# Patient Record
Sex: Female | Born: 1970 | Race: Asian | Hispanic: No | Marital: Married | Smoking: Former smoker
Health system: Southern US, Community
[De-identification: ages and names within clinical notes are randomized; demographics above are authoritative.]

## PROBLEM LIST (undated history)

## (undated) DIAGNOSIS — E039 Hypothyroidism, unspecified: Secondary | ICD-10-CM

## (undated) DIAGNOSIS — Q211 Atrial septal defect: Secondary | ICD-10-CM

## (undated) DIAGNOSIS — E041 Nontoxic single thyroid nodule: Secondary | ICD-10-CM

## (undated) DIAGNOSIS — D649 Anemia, unspecified: Secondary | ICD-10-CM

## (undated) DIAGNOSIS — R921 Mammographic calcification found on diagnostic imaging of breast: Principal | ICD-10-CM

## (undated) DIAGNOSIS — K219 Gastro-esophageal reflux disease without esophagitis: Secondary | ICD-10-CM

## (undated) DIAGNOSIS — Q2112 Patent foramen ovale: Secondary | ICD-10-CM

## (undated) DIAGNOSIS — F418 Other specified anxiety disorders: Secondary | ICD-10-CM

## (undated) DIAGNOSIS — I639 Cerebral infarction, unspecified: Secondary | ICD-10-CM

## (undated) DIAGNOSIS — N920 Excessive and frequent menstruation with regular cycle: Secondary | ICD-10-CM

## (undated) HISTORY — DX: Anemia, unspecified: D64.9

## (undated) HISTORY — DX: Nontoxic single thyroid nodule: E04.1

## (undated) HISTORY — DX: Hypothyroidism, unspecified: E03.9

## (undated) HISTORY — DX: Gastro-esophageal reflux disease without esophagitis: K21.9

## (undated) HISTORY — DX: Mammographic calcification found on diagnostic imaging of breast: R92.1

## (undated) HISTORY — DX: Excessive and frequent menstruation with regular cycle: N92.0

## (undated) HISTORY — DX: Other specified anxiety disorders: F41.8

---

## 2004-07-29 DIAGNOSIS — R921 Mammographic calcification found on diagnostic imaging of breast: Secondary | ICD-10-CM

## 2004-07-29 HISTORY — DX: Mammographic calcification found on diagnostic imaging of breast: R92.1

## 2007-10-28 HISTORY — PX: BIOPSY THYROID: PRO38

## 2008-10-12 LAB — HM MAMMOGRAPHY

## 2011-01-15 ENCOUNTER — Ambulatory Visit (INDEPENDENT_AMBULATORY_CARE_PROVIDER_SITE_OTHER): Payer: 59 | Admitting: Family

## 2011-01-15 ENCOUNTER — Encounter: Payer: Self-pay | Admitting: Family

## 2011-01-15 VITALS — BP 90/70 | HR 66 | Temp 97.8°F | Resp 16 | Ht 63.5 in | Wt 128.0 lb

## 2011-01-15 DIAGNOSIS — E039 Hypothyroidism, unspecified: Secondary | ICD-10-CM

## 2011-01-15 DIAGNOSIS — D239 Other benign neoplasm of skin, unspecified: Secondary | ICD-10-CM

## 2011-01-15 DIAGNOSIS — R921 Mammographic calcification found on diagnostic imaging of breast: Secondary | ICD-10-CM | POA: Insufficient documentation

## 2011-01-15 DIAGNOSIS — Z0000 Encounter for general adult medical examination without abnormal findings: Secondary | ICD-10-CM | POA: Insufficient documentation

## 2011-01-15 DIAGNOSIS — E041 Nontoxic single thyroid nodule: Secondary | ICD-10-CM

## 2011-01-15 DIAGNOSIS — R928 Other abnormal and inconclusive findings on diagnostic imaging of breast: Secondary | ICD-10-CM

## 2011-01-15 DIAGNOSIS — D229 Melanocytic nevi, unspecified: Secondary | ICD-10-CM

## 2011-01-15 HISTORY — DX: Nontoxic single thyroid nodule: E04.1

## 2011-01-15 HISTORY — DX: Hypothyroidism, unspecified: E03.9

## 2011-01-15 LAB — HEPATIC FUNCTION PANEL
AST: 12 U/L (ref 0–37)
Albumin: 4.4 g/dL (ref 3.5–5.2)
Total Bilirubin: 0.5 mg/dL (ref 0.3–1.2)
Total Protein: 7.3 g/dL (ref 6.0–8.3)

## 2011-01-15 LAB — CBC WITH DIFFERENTIAL/PLATELET
Basophils Absolute: 0 10*3/uL (ref 0.0–0.1)
Eosinophils Absolute: 0.1 10*3/uL (ref 0.0–0.7)
Eosinophils Relative: 2 % (ref 0–5)
MCH: 30 pg (ref 26.0–34.0)
MCV: 92 fL (ref 78.0–100.0)
Monocytes Absolute: 0.4 10*3/uL (ref 0.1–1.0)
Platelets: 208 10*3/uL (ref 150–400)
RDW: 12.8 % (ref 11.5–15.5)

## 2011-01-15 LAB — LIPID PANEL
Cholesterol: 192 mg/dL (ref 0–200)
HDL: 63 mg/dL (ref >39)
Total CHOL/HDL Ratio: 3 Ratio
Triglycerides: 74 mg/dL (ref <150)

## 2011-01-15 LAB — TSH: TSH: 0.519 u[IU]/mL (ref 0.350–4.500)

## 2011-01-15 NOTE — Assessment & Plan Note (Signed)
Will try to obtain records from Dr. Katrinka Blazing in regards to previous workup. Check TSH today

## 2011-01-15 NOTE — Patient Instructions (Signed)
Please schedule separate follow up visits for Pap smear and mole removal.

## 2011-01-15 NOTE — Assessment & Plan Note (Signed)
She is due for a followup diagnostic mammogram. We'll try to have this completed at [facility].

## 2011-01-15 NOTE — Progress Notes (Signed)
  Subjective:    Patient ID: Melinda Kirk, female    DOB: 01-Jan-1971, 40 y.o.   MRN: [id number]  HPI  Ms. Melinda Kirk is a 40 yr old female who presents today to establish care.    Thyroid nodule-she reports that she has followed with Dr. Katrinka Blazing of endocrinology in [facility]. She reports history of multiple thyroid nodules as well as a thyroid biopsy which was reportedly normal.  L. Breast calcification- 2006.  This has been watched by serial mammograms.  She has had her mammogram at [facility] in [place].  [facility] She would [facility].  Preventative- Does not exercise regularly.  Last pap was 2/11. Has menses today.  Diet is fairly heathly, lots of veggies.      Review of Systems  Constitutional: Negative for fever and unexpected weight change.  HENT: Negative for congestion.   Eyes:       Notes dry eyes- sees opthalmology  Respiratory: Negative for chest tightness and shortness of breath.   Cardiovascular: Negative for chest pain.  Gastrointestinal: Negative for nausea, vomiting and diarrhea.       Genella Rife- well controlled on omeprazole.  Genitourinary: Negative for menstrual problem.  Musculoskeletal: Positive for back pain. Negative for myalgias and arthralgias.  Neurological: Negative for dizziness, weakness and numbness.  Hematological: Negative for adenopathy. Does not bruise/bleed easily.  Psychiatric/Behavioral:       Denies hx of depression or anxiety.       Objective:   Physical Exam Gen.: Awake, alert, Filipino female in no acute distress Neck: Supple without JVD, no palpable thyroid nodules noted today on exam ENT: Oropharynx is clear without lesions, bilateral tympanic membranes intact without erythema or bulging Cardiovascular: S1-S2, regular rate and rhythm no lower extremity edema is noted Respiratory: Breath sounds are clear to auscultation bilaterally without wheezes rales or rhonchi Breast:  No couple masses noted in either breast, no nipple discharge noted, no dimpling or puckering, no axillary lymphadenopathy is noted Musculoskeletal: No joint swelling is noted Psychiatric patient is awake and oriented x4 calm and pleasant.       Assessment & Plan:

## 2011-01-15 NOTE — Assessment & Plan Note (Signed)
The patient was counseled on diet and exercise. She will return for Pap smear when she has completed her menses. Immunizations reviewed and up-to-date.

## 2011-01-15 NOTE — Assessment & Plan Note (Signed)
She has an enlarging dark mole noted on her right side, we'll plan to bring her back to her shave biopsy.

## 2011-01-15 NOTE — Progress Notes (Signed)
  Subjective:    Patient ID: Melinda Kirk, female    DOB: August 08, 1970, 40 y.o.   MRN: [id number]  HPI    Review of Systems     Past Medical History  Diagnosis Date  . GERD (gastroesophageal reflux disease)   . Breast calcification, left 07/29/04  . Thyroid nodule   . Thyroid nodule     History   Social History  . Marital Status: Married    Spouse Name: N/A    Number of Children: N/A  . Years of Education: N/A   Occupational History  . Not on file.   Social History Main Topics  . Smoking status: Former Smoker -- 5 years  . Smokeless tobacco: Never Used  . Alcohol Use: Yes     occasional  . Drug Use: No  . Sexually Active: Not on file   Other Topics Concern  . 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     Past Surgical History  Procedure Date  . Biopsy thyroid 07/29/08    benign biopsy of thyroid nodules.    Family History  Problem Relation Age of Onset  . Stroke Mother   . Arthritis Mother   . Heart disease Father     No Known Allergies  No current outpatient prescriptions on file prior to visit.    BP 90/70  Pulse 66  Temp(Src) 97.8 F (36.6 C) (Oral)  Resp 16  Ht 5' 3.5" (1.613 m)  Wt 128 lb (58.06 kg)  BMI 22.32 kg/m2  LMP 01/15/2011    Objective:   Physical Exam        Assessment & Plan:

## 2011-01-16 ENCOUNTER — Encounter: Payer: Self-pay | Admitting: Family

## 2011-01-16 LAB — BASIC METABOLIC PANEL WITH GFR
BUN: 10 mg/dL (ref 6–23)
CO2: 25 mEq/L (ref 19–32)
Calcium: 9.5 mg/dL (ref 8.4–10.5)
Creat: 0.62 mg/dL (ref 0.50–1.10)

## 2011-01-18 ENCOUNTER — Ambulatory Visit: Payer: 59 | Admitting: Family

## 2011-01-22 ENCOUNTER — Ambulatory Visit (INDEPENDENT_AMBULATORY_CARE_PROVIDER_SITE_OTHER): Payer: 59 | Admitting: Family

## 2011-01-22 ENCOUNTER — Encounter: Payer: Self-pay | Admitting: Family

## 2011-01-22 ENCOUNTER — Other Ambulatory Visit (HOSPITAL_COMMUNITY)
Admission: RE | Admit: 2011-01-22 | Discharge: 2011-01-22 | Disposition: A | Discharge status: Home / Self Care | Payer: 59 | Source: Ambulatory Visit | Attending: Family | Admitting: Family

## 2011-01-22 DIAGNOSIS — Z01419 Encounter for gynecological examination (general) (routine) without abnormal findings: Secondary | ICD-10-CM | POA: Insufficient documentation

## 2011-01-22 DIAGNOSIS — D225 Melanocytic nevi of trunk: Secondary | ICD-10-CM

## 2011-01-22 DIAGNOSIS — Z0000 Encounter for general adult medical examination without abnormal findings: Secondary | ICD-10-CM | POA: Insufficient documentation

## 2011-01-22 DIAGNOSIS — D485 Neoplasm of uncertain behavior of skin: Secondary | ICD-10-CM

## 2011-01-22 DIAGNOSIS — D235 Other benign neoplasm of skin of trunk: Secondary | ICD-10-CM

## 2011-01-22 DIAGNOSIS — Q560 Hermaphroditism, not elsewhere classified: Secondary | ICD-10-CM

## 2011-01-22 NOTE — Patient Instructions (Signed)
You will be contacted about the pathology results of your Pap and your Mole removal. Follow up in 6 months.

## 2011-01-22 NOTE — Progress Notes (Signed)
  Subjective:    Patient ID: Melinda Kirk, female    DOB: Jan 05, 1971, 40 y.o.   MRN: [id number]  HPI  GYN- last pap was >1 yr ago- always normal.  Period was noted to be >7 days this week.  Otherwise regular periods.  Denies painful cramping. [id number].  Mole- has had "forever" notes that recently it has enlarged.      Review of Systems     Objective:   Physical Exam  Constitutional: She appears well-developed and well-nourished.  Genitourinary: Vagina normal and uterus normal.       IUD string visualized in cervical os.  Difficulty pap due to anterior cervix. No adnexal fullness. No lesions.    Breast: normal, no palpable masses, no axillary LAD. Skin- Black colored irregularly shaped nevus raised beneath right axilla.  Approximately 0.5cm in diameter        Assessment & Plan:

## 2011-01-22 NOTE — Assessment & Plan Note (Signed)
Pap was performed today.

## 2011-01-22 NOTE — Assessment & Plan Note (Signed)
After informed consent, area was prepped with betadine and anesthetized with 1%lidocaine with epi.  Shave off with surgical razor using sterile technique. Area was then cauterized until hemostasis was achieved.

## 2011-01-25 ENCOUNTER — Encounter: Payer: Self-pay | Admitting: Family

## 2011-02-08 ENCOUNTER — Ambulatory Visit
Admission: RE | Admit: 2011-02-08 | Discharge: 2011-02-08 | Disposition: A | Discharge status: Home / Self Care | Payer: 59 | Source: Ambulatory Visit | Attending: Family | Admitting: Family

## 2011-02-08 DIAGNOSIS — R921 Mammographic calcification found on diagnostic imaging of breast: Secondary | ICD-10-CM

## 2011-02-27 ENCOUNTER — Telehealth: Payer: Self-pay | Admitting: *Deleted

## 2011-02-27 ENCOUNTER — Encounter: Payer: Self-pay | Admitting: Family

## 2011-02-27 NOTE — Telephone Encounter (Signed)
Records received from Dr Jonny Ruiz and forwarded to Provider for review.

## 2011-02-27 NOTE — Telephone Encounter (Signed)
Records received from Dr Katrinka Blazing at [facility] at [facility] and forwarded to provider for review.

## 2011-03-08 ENCOUNTER — Encounter: Payer: Self-pay | Admitting: Family

## 2011-03-08 DIAGNOSIS — E559 Vitamin D deficiency, unspecified: Secondary | ICD-10-CM | POA: Insufficient documentation

## 2011-03-19 ENCOUNTER — Encounter: Payer: Self-pay | Admitting: Family

## 2011-05-02 ENCOUNTER — Telehealth: Payer: Self-pay | Admitting: *Deleted

## 2011-05-02 MED ORDER — ESOMEPRAZOLE MAGNESIUM 40 MG PO CPDR: 40.0000 mg | DELAYED_RELEASE_CAPSULE | Freq: Every day | ORAL | Status: DC

## 2011-05-02 NOTE — Telephone Encounter (Signed)
Rx changed to nexium and sent to [facility].

## 2011-05-02 NOTE — Telephone Encounter (Signed)
Pt.notified

## 2011-05-02 NOTE — Telephone Encounter (Signed)
Received call from pt stating Prilosec does not last as long as Nexium does. Pt reports Prilosec seems to run out after 8 hours and Nexium helps her at least 12 hours. Had rx of Nexium from a previous doctor and wants to know if we can prescribe it for her?  Nexium 40mg  1 daily. Please advise.

## 2011-05-03 ENCOUNTER — Telehealth: Payer: Self-pay | Admitting: Family

## 2011-05-03 MED ORDER — ESOMEPRAZOLE MAGNESIUM 40 MG PO CPDR: 40.0000 mg | DELAYED_RELEASE_CAPSULE | Freq: Every day | ORAL | Status: DC

## 2011-05-03 NOTE — Telephone Encounter (Signed)
Nexium rx cancelled at [facility].

## 2011-05-03 NOTE — Telephone Encounter (Signed)
Patient states that she would like to use [facility] [place] outpatient pharmacy for nexium. Please send rx to pharmacy.

## 2011-05-03 NOTE — Telephone Encounter (Signed)
Received form from pharmacy that Nexium is going to require prior authorization. Spoke with pt, she states she has tried Zantac, Prevacid and Prilosec without relief of symptoms. States she is currently taking Prilosec OTC 20mg  once a day and wants to know if she can take it twice a day for symptom control. Please advise.

## 2011-05-03 NOTE — Telephone Encounter (Signed)
Spoke to [place] at [facility] [phone]. She states rx is covered at once daily dosing (30 for 30days). Pharmacy needs to run through as 30 for 30 and it will be covered. Rx sent to [facility] pharmacy. Pt notified.

## 2011-07-02 ENCOUNTER — Ambulatory Visit (INDEPENDENT_AMBULATORY_CARE_PROVIDER_SITE_OTHER): Payer: 59 | Admitting: Family

## 2011-07-02 DIAGNOSIS — K219 Gastro-esophageal reflux disease without esophagitis: Secondary | ICD-10-CM

## 2011-07-02 DIAGNOSIS — Z0289 Encounter for other administrative examinations: Secondary | ICD-10-CM

## 2011-07-02 NOTE — Progress Notes (Signed)
  Subjective:    Patient ID: Melinda Kirk, female    DOB: 10-01-1970, 40 y.o.   MRN: [id number]  HPI    Review of Systems     Objective:   Physical Exam        Assessment & Plan:  Pt no showed her visit today.

## 2011-09-23 ENCOUNTER — Encounter: Payer: Self-pay | Admitting: Family

## 2011-09-23 ENCOUNTER — Ambulatory Visit (INDEPENDENT_AMBULATORY_CARE_PROVIDER_SITE_OTHER): Payer: 59 | Admitting: Family

## 2011-09-23 DIAGNOSIS — H1132 Conjunctival hemorrhage, left eye: Secondary | ICD-10-CM

## 2011-09-23 DIAGNOSIS — H1130 Conjunctival hemorrhage, unspecified eye: Secondary | ICD-10-CM

## 2011-09-23 NOTE — Progress Notes (Signed)
  Subjective:    Patient ID: Melinda Kirk, female    DOB: 08-15-1970, 41 y.o.   MRN: [id number]  HPI  Ms.  Kirk is a 41 yr old female who presents today with chief complaint of redness left eye.  She reports that symptoms started Friday at lunch time.  Denies associated pain, visual problems or discharge.  Review of Systems See HPI     Objective:   Physical Exam  Constitutional: She appears well-developed and well-nourished.  Eyes: Left conjunctiva has a hemorrhage.    Cardiovascular: Normal rate and regular rhythm.   No murmur heard. Pulmonary/Chest: Effort normal and breath sounds normal.  Psychiatric: She has a normal mood and affect. Her behavior is normal. Judgment and thought content normal.          Assessment & Plan:

## 2011-09-23 NOTE — Patient Instructions (Signed)
Please call if symptoms worsen or if no improvement in 1 week.  

## 2011-09-23 NOTE — Assessment & Plan Note (Signed)
41 yr old female with conjunctival hemorrhage.  She is instructed to go to the ED if she develops visual problems.  Provided reassurance that this is generally self limited.  I recommended saline eye drops otc as needed.

## 2012-09-12 ENCOUNTER — Other Ambulatory Visit: Payer: Self-pay

## 2012-12-31 ENCOUNTER — Encounter: Payer: Self-pay | Admitting: Family Medicine

## 2012-12-31 ENCOUNTER — Ambulatory Visit (INDEPENDENT_AMBULATORY_CARE_PROVIDER_SITE_OTHER): Payer: 59 | Admitting: Family Medicine

## 2012-12-31 VITALS — BP 104/60 | HR 71 | Temp 98.2°F | Ht 63.5 in | Wt 120.1 lb

## 2012-12-31 DIAGNOSIS — R51 Headache: Secondary | ICD-10-CM

## 2012-12-31 DIAGNOSIS — M542 Cervicalgia: Secondary | ICD-10-CM

## 2012-12-31 MED ORDER — CYCLOBENZAPRINE HCL 10 MG PO TABS: 10.0000 mg | ORAL_TABLET | Freq: Every evening | ORAL | Status: DC | PRN

## 2012-12-31 NOTE — Patient Instructions (Addendum)
Need 64 oz of clear fluids daily Ginger can help nausea,  protein with each meal Start a calcium/magnesium/zinc daily tums for heartburn Moist heat and stretch neck  Do not skip breakfast Labs in next couple weeks magnesium lipid, renal, tsh, hepatic, cbc Next visit annual   Gastroesophageal Reflux Disease, Adult Gastroesophageal reflux disease (GERD) happens when acid from your stomach flows up into the esophagus. When acid comes in contact with the esophagus, the acid causes soreness (inflammation) in the esophagus. Over time, GERD may create small holes (ulcers) in the lining of the esophagus. CAUSES   Increased body weight. This puts pressure on the stomach, making acid rise from the stomach into the esophagus.  Smoking. This increases acid production in the stomach.  Drinking alcohol. This causes decreased pressure in the lower esophageal sphincter (valve or ring of muscle between the esophagus and stomach), allowing acid from the stomach into the esophagus.  Late evening meals and a full stomach. This increases pressure and acid production in the stomach.  A malformed lower esophageal sphincter. Sometimes, no cause is found. SYMPTOMS   Burning pain in the lower part of the mid-chest behind the breastbone and in the mid-stomach area. This may occur twice a week or more often.  Trouble swallowing.  Sore throat.  Dry cough.  Asthma-like symptoms including chest tightness, shortness of breath, or wheezing. DIAGNOSIS  Your caregiver may be able to diagnose GERD based on your symptoms. In some cases, X-rays and other tests may be done to check for complications or to check the condition of your stomach and esophagus. TREATMENT  Your caregiver may recommend over-the-counter or prescription medicines to help decrease acid production. Ask your caregiver before starting or adding any new medicines.  HOME CARE INSTRUCTIONS   Change the factors that you can control. Ask your  caregiver for guidance concerning weight loss, quitting smoking, and alcohol consumption.  Avoid foods and drinks that make your symptoms worse, such as:  Caffeine or alcoholic drinks.  Chocolate.  Peppermint or mint flavorings.  Garlic and onions.  Spicy foods.  Citrus fruits, such as oranges, lemons, or limes.  Tomato-based foods such as sauce, chili, salsa, and pizza.  Fried and fatty foods.  Avoid lying down for the 3 hours prior to your bedtime or prior to taking a nap.  Eat small, frequent meals instead of large meals.  Wear loose-fitting clothing. Do not wear anything tight around your waist that causes pressure on your stomach.  Raise the head of your bed 6 to 8 inches with wood blocks to help you sleep. Extra pillows will not help.  Only take over-the-counter or prescription medicines for pain, discomfort, or fever as directed by your caregiver.  Do not take aspirin, ibuprofen, or other nonsteroidal anti-inflammatory drugs (NSAIDs). SEEK IMMEDIATE MEDICAL CARE IF:   You have pain in your arms, neck, jaw, teeth, or back.  Your pain increases or changes in intensity or duration.  You develop nausea, vomiting, or sweating (diaphoresis).  You develop shortness of breath, or you faint.  Your vomit is green, yellow, black, or looks like coffee grounds or blood.  Your stool is red, bloody, or black. These symptoms could be signs of other problems, such as heart disease, gastric bleeding, or esophageal bleeding. MAKE SURE YOU:   Understand these instructions.  Will watch your condition.  Will get help right away if you are not doing well or get worse. Document Released: 04/24/2005 Document Revised: 10/07/2011 Document Reviewed: 02/01/2011 Exit[employer] Patient  Information 2014 [employer].  

## 2013-01-03 ENCOUNTER — Encounter: Payer: Self-pay | Admitting: Family Medicine

## 2013-01-03 DIAGNOSIS — G43909 Migraine, unspecified, not intractable, without status migrainosus: Secondary | ICD-10-CM | POA: Insufficient documentation

## 2013-01-03 NOTE — Assessment & Plan Note (Addendum)
Patient with neck pain and spasm. Start Flexeril qhs, Naproxen bid prn and moist heat with stretching.

## 2013-01-03 NOTE — Progress Notes (Signed)
Patient ID: Melinda Kirk, female   DOB: [birth year], 42 y.o.   MRN: [id number] RUDIE RIKARD [id number] 05-Nov-1970 01/03/2013      Progress Note-Follow Up  Subjective  Chief Complaint  Chief Complaint  Patient presents with  . Headache    X off and on for a week    HPI  Patient is a 42 year old female in today to discuss headaches. Shows a long history of headaches and photophobia and phonophobia this headache has been persistent./Extraocular left occiput began this week and she describes a pulling and pulsing sensation. No injury or fall. No fevers or chills. Some mild congestion but no cough sore throat ear pain. She's tried some Aleve with only minimal results Tylenol: Minimal results. Pain has been better and worse throughout the week. No chest pain, acute shortness of breath  Past Medical History  Diagnosis Date  . GERD (gastroesophageal reflux disease)   . Breast calcification, left 07/29/04  . Thyroid nodule     s/p FNA- 4/09- benign  . Vitamin D deficiency 03/08/2011  . Headache(784.0) 01/03/2013    Past Surgical History  Procedure Laterality Date  . Biopsy thyroid  10/2007    benign biopsy of thyroid nodules.    Family History  Problem Relation Age of Onset  . Stroke Mother   . Arthritis Mother   . Heart disease Father     History   Social History  . Marital Status: Married    Spouse Name: N/A    Number of Children: N/A  . Years of Education: N/A   Occupational History  . Not on file.   Social History Main Topics  . Smoking status: Former Smoker -- 5 years  . Smokeless tobacco: Never Used  . Alcohol Use: Yes     Comment: occasional  . Drug Use: No  . Sexually Active: Not on file   Other Topics Concern  . Not on file   Social History Narrative   Regular exercise:  No   Caffeine: 1-2 cups coffee / tea daily.   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     Current Outpatient Prescriptions on File Prior  to Visit  Medication Sig Dispense Refill  . esomeprazole (NEXIUM) 40 MG capsule Take 1 capsule (40 mg total) by mouth daily before breakfast.  30 capsule  5  . levonorgestrel (MIRENA) 20 MCG/24HR IUD 1 each by Intrauterine route once.         No current facility-administered medications on file prior to visit.    No Known Allergies  Review of Systems  Review of Systems  Constitutional: Negative for fever and malaise/fatigue.  HENT: Positive for neck pain. Negative for congestion.   Eyes: Positive for photophobia. Negative for discharge.  Respiratory: Negative for shortness of breath.   Cardiovascular: Negative for chest pain, palpitations and leg swelling.  Gastrointestinal: Negative for nausea, abdominal pain and diarrhea.  Genitourinary: Negative for dysuria.  Musculoskeletal: Negative for falls.  Skin: Negative for rash.  Neurological: Positive for headaches. Negative for loss of consciousness.  Endo/Heme/Allergies: Negative for polydipsia.  Psychiatric/Behavioral: Negative for depression and suicidal ideas. The patient is not nervous/anxious and does not have insomnia.     Objective  BP 104/60  Pulse 71  Temp(Src) 98.2 F (36.8 C) (Oral)  Ht 5' 3.5" (1.613 m)  Wt 120 lb 1.3 oz (54.468 kg)  BMI 20.93 kg/m2  SpO2 98%  LMP 12/31/2012  Physical Exam  Physical Exam  Constitutional: She is oriented to person, place, and time and well-developed, well-nourished, and in no distress. No distress.  HENT:  Head: Normocephalic and atraumatic.  Eyes: Conjunctivae are normal.  Neck: Neck supple. No thyromegaly present.  Cardiovascular: Normal rate, regular rhythm and normal heart sounds.   No murmur heard. Pulmonary/Chest: Effort normal and breath sounds normal. She has no wheezes.  Abdominal: She exhibits no distension and no mass.  Musculoskeletal: She exhibits no edema.  Lymphadenopathy:    She has no cervical adenopathy.  Neurological: She is alert and oriented to  person, place, and time.  Skin: Skin is warm and dry. No rash noted. She is not diaphoretic.  Psychiatric: Memory, affect and judgment normal.    Lab Results  Component Value Date   TSH 0.519 01/15/2011   Lab Results  Component Value Date   WBC 6.4 01/15/2011   HGB 12.7 01/15/2011   HCT 39.0 01/15/2011   MCV 92.0 01/15/2011   PLT 208 01/15/2011   Lab Results  Component Value Date   CREATININE 0.62 01/15/2011   BUN 10 01/15/2011   NA 139 01/15/2011   K 4.4 01/15/2011   CL 104 01/15/2011   CO2 25 01/15/2011   Lab Results  Component Value Date   ALT <8 01/15/2011   AST 12 01/15/2011   ALKPHOS 43 01/15/2011   BILITOT 0.5 01/15/2011   Lab Results  Component Value Date   CHOL 192 01/15/2011   Lab Results  Component Value Date   HDL 63 01/15/2011   Lab Results  Component Value Date   LDLCALC 114* 01/15/2011   Lab Results  Component Value Date   TRIG 74 01/15/2011   Lab Results  Component Value Date   CHOLHDL 3.0 01/15/2011     Assessment & Plan  Headache(784.0) Patient with neck pain and spasm. Start Flexeril qhs, Naproxen bid prn and moist heat with stretching.

## 2013-02-05 ENCOUNTER — Telehealth: Payer: Self-pay | Admitting: *Deleted

## 2013-02-05 DIAGNOSIS — Z0000 Encounter for general adult medical examination without abnormal findings: Secondary | ICD-10-CM

## 2013-02-05 LAB — CBC WITH DIFFERENTIAL/PLATELET
Basophils Absolute: 0.1 10*3/uL (ref 0.0–0.1)
Eosinophils Absolute: 0.1 10*3/uL (ref 0.0–0.7)
Eosinophils Relative: 2 % (ref 0–5)
Lymphocytes Relative: 37 % (ref 12–46)
Lymphs Abs: 2.4 10*3/uL (ref 0.7–4.0)
MCH: 29.3 pg (ref 26.0–34.0)
MCV: 88.9 fL (ref 78.0–100.0)
Neutrophils Relative %: 56 % (ref 43–77)
Platelets: 271 10*3/uL (ref 150–400)
RBC: 4.58 MIL/uL (ref 3.87–5.11)
RDW: 12.9 % (ref 11.5–15.5)
WBC: 6.6 10*3/uL (ref 4.0–10.5)

## 2013-02-05 NOTE — Telephone Encounter (Signed)
Pt presented in lab today for blood draw w/o order; pt has CPE scheduled for 07.14.14, orders entered/SLS

## 2013-02-06 LAB — HEPATIC FUNCTION PANEL
ALT: 14 U/L (ref 0–35)
AST: 16 U/L (ref 0–37)
Bilirubin, Direct: 0.1 mg/dL (ref 0.0–0.3)
Total Protein: 7.4 g/dL (ref 6.0–8.3)

## 2013-02-06 LAB — BASIC METABOLIC PANEL
Calcium: 9.3 mg/dL (ref 8.4–10.5)
Glucose, Bld: 75 mg/dL (ref 70–99)
Potassium: 4.3 mEq/L (ref 3.5–5.3)
Sodium: 139 mEq/L (ref 135–145)

## 2013-02-06 LAB — LIPID PANEL
Cholesterol: 209 mg/dL — ABNORMAL HIGH (ref 0–200)
HDL: 59 mg/dL (ref >39)
Total CHOL/HDL Ratio: 3.5 Ratio
Triglycerides: 98 mg/dL (ref <150)

## 2013-02-06 LAB — URINALYSIS, MICROSCOPIC ONLY: Squamous Epithelial / LPF: NONE SEEN

## 2013-02-08 ENCOUNTER — Ambulatory Visit (INDEPENDENT_AMBULATORY_CARE_PROVIDER_SITE_OTHER): Payer: 59 | Admitting: Family Medicine

## 2013-02-08 ENCOUNTER — Encounter: Payer: Self-pay | Admitting: Family Medicine

## 2013-02-08 VITALS — BP 96/60 | HR 75 | Temp 98.0°F | Ht 63.5 in | Wt 122.1 lb

## 2013-02-08 DIAGNOSIS — E785 Hyperlipidemia, unspecified: Secondary | ICD-10-CM

## 2013-02-08 DIAGNOSIS — K219 Gastro-esophageal reflux disease without esophagitis: Secondary | ICD-10-CM

## 2013-02-08 DIAGNOSIS — E039 Hypothyroidism, unspecified: Secondary | ICD-10-CM

## 2013-02-08 DIAGNOSIS — E782 Mixed hyperlipidemia: Secondary | ICD-10-CM | POA: Insufficient documentation

## 2013-02-08 DIAGNOSIS — Z0000 Encounter for general adult medical examination without abnormal findings: Secondary | ICD-10-CM

## 2013-02-08 NOTE — Patient Instructions (Addendum)
Next visit annual with labs prior vitamin D, liver, renal, cbc, tsh, lipid  Topical Salon Pas patches or cream  MegaRed krill oil caps daily  Digestive Advantage probiotic daily by [employer]  Preventive Care for Adults, Female A healthy lifestyle and preventive care can promote health and wellness. Preventive health guidelines for women include the following key practices.  A routine yearly physical is a good way to check with your caregiver about your health and preventive screening. It is a chance to share any concerns and updates on your health, and to receive a thorough exam.  Visit your dentist for a routine exam and preventive care every 6 months. Brush your teeth twice a day and floss once a day. Good oral hygiene prevents tooth decay and gum disease.  The frequency of eye exams is based on your age, health, family medical history, use of contact lenses, and other factors. Follow your caregiver's recommendations for frequency of eye exams.  Eat a healthy diet. Foods like vegetables, fruits, whole grains, low-fat dairy products, and lean protein foods contain the nutrients you need without too many calories. Decrease your intake of foods high in solid fats, added sugars, and salt. Eat the right amount of calories for you.Get information about a proper diet from your caregiver, if necessary.  Regular physical exercise is one of the most important things you can do for your health. Most adults should get at least 150 minutes of moderate-intensity exercise (any activity that increases your heart rate and causes you to sweat) each week. In addition, most adults need muscle-strengthening exercises on 2 or more days a week.  Maintain a healthy weight. The body mass index (BMI) is a screening tool to identify possible weight problems. It provides an estimate of body fat based on height and weight. Your caregiver can help determine your BMI, and can help you achieve or maintain a healthy weight.For  adults 20 years and older:  A BMI below 18.5 is considered underweight.  A BMI of 18.5 to 24.9 is normal.  A BMI of 25 to 29.9 is considered overweight.  A BMI of 30 and above is considered obese.  Maintain normal blood lipids and cholesterol levels by exercising and minimizing your intake of saturated fat. Eat a balanced diet with plenty of fruit and vegetables. Blood tests for lipids and cholesterol should begin at age 21 and be repeated every 5 years. If your lipid or cholesterol levels are high, you are over 50, or you are at high risk for heart disease, you may need your cholesterol levels checked more frequently.Ongoing high lipid and cholesterol levels should be treated with medicines if diet and exercise are not effective.  If you smoke, find out from your caregiver how to quit. If you do not use tobacco, do not start.  If you are pregnant, do not drink alcohol. If you are breastfeeding, be very cautious about drinking alcohol. If you are not pregnant and choose to drink alcohol, do not exceed 1 drink per day. One drink is considered to be 12 ounces (355 mL) of beer, 5 ounces (148 mL) of wine, or 1.5 ounces (44 mL) of liquor.  Avoid use of street drugs. Do not share needles with anyone. Ask for help if you need support or instructions about stopping the use of drugs.  High blood pressure causes heart disease and increases the risk of stroke. Your blood pressure should be checked at least every 1 to 2 years. Ongoing high blood pressure should  be treated with medicines if weight loss and exercise are not effective.  If you are 54 to 43 years old, ask your caregiver if you should take aspirin to prevent strokes.  Diabetes screening involves taking a blood sample to check your fasting blood sugar level. This should be done once every 3 years, after age 35, if you are within normal weight and without risk factors for diabetes. Testing should be considered at a younger age or be carried out  more frequently if you are overweight and have at least 1 risk factor for diabetes.  Breast cancer screening is essential preventive care for women. You should practice "breast self-awareness." This means understanding the normal appearance and feel of your breasts and may include breast self-examination. Any changes detected, no matter how small, should be reported to a caregiver. Women in their 30s and 30s should have a clinical breast exam (CBE) by a caregiver as part of a regular health exam every 1 to 3 years. After age 57, women should have a CBE every year. Starting at age 10, women should consider having a mammography (breast X-ray test) every year. Women who have a family history of breast cancer should talk to their caregiver about genetic screening. Women at a high risk of breast cancer should talk to their caregivers about having magnetic resonance imaging (MRI) and a mammography every year.  The Pap test is a screening test for cervical cancer. A Pap test can show cell changes on the cervix that might become cervical cancer if left untreated. A Pap test is a procedure in which cells are obtained and examined from the lower end of the uterus (cervix).  Women should have a Pap test starting at age 78.  Between ages 43 and 57, Pap tests should be repeated every 2 years.  Beginning at age 49, you should have a Pap test every 3 years as long as the past 3 Pap tests have been normal.  Some women have medical problems that increase the chance of getting cervical cancer. Talk to your caregiver about these problems. It is especially important to talk to your caregiver if a new problem develops soon after your last Pap test. In these cases, your caregiver may recommend more frequent screening and Pap tests.  The above recommendations are the same for women who have or have not gotten the vaccine for human papillomavirus (HPV).  If you had a hysterectomy for a problem that was not cancer or a  condition that could lead to cancer, then you no longer need Pap tests. Even if you no longer need a Pap test, a regular exam is a good idea to make sure no other problems are starting.  If you are between ages 50 and 66, and you have had normal Pap tests going back 10 years, you no longer need Pap tests. Even if you no longer need a Pap test, a regular exam is a good idea to make sure no other problems are starting.  If you have had past treatment for cervical cancer or a condition that could lead to cancer, you need Pap tests and screening for cancer for at least 20 years after your treatment.  If Pap tests have been discontinued, risk factors (such as a new sexual partner) need to be reassessed to determine if screening should be resumed.  The HPV test is an additional test that may be used for cervical cancer screening. The HPV test looks for the virus that can cause  the cell changes on the cervix. The cells collected during the Pap test can be tested for HPV. The HPV test could be used to screen women aged 28 years and older, and should be used in women of any age who have unclear Pap test results. After the age of 34, women should have HPV testing at the same frequency as a Pap test.  Colorectal cancer can be detected and often prevented. Most routine colorectal cancer screening begins at the age of 11 and continues through age 41. However, your caregiver may recommend screening at an earlier age if you have risk factors for colon cancer. On a yearly basis, your caregiver may provide home test kits to check for hidden blood in the stool. Use of a small camera at the end of a tube, to directly examine the colon (sigmoidoscopy or colonoscopy), can detect the earliest forms of colorectal cancer. Talk to your caregiver about this at age 70, when routine screening begins. Direct examination of the colon should be repeated every 5 to 10 years through age 16, unless early forms of pre-cancerous polyps or  small growths are found.  Hepatitis C blood testing is recommended for all people born from [birth year] through 1965 and any individual with known risks for hepatitis C.  Practice safe sex. Use condoms and avoid high-risk sexual practices to reduce the spread of sexually transmitted infections (STIs). STIs include gonorrhea, chlamydia, syphilis, trichomonas, herpes, HPV, and human immunodeficiency virus (HIV). Herpes, HIV, and HPV are viral illnesses that have no cure. They can result in disability, cancer, and death. Sexually active women aged 56 and younger should be checked for chlamydia. Older women with new or multiple partners should also be tested for chlamydia. Testing for other STIs is recommended if you are sexually active and at increased risk.  Osteoporosis is a disease in which the bones lose minerals and strength with aging. This can result in serious bone fractures. The risk of osteoporosis can be identified using a bone density scan. Women ages 72 and over and women at risk for fractures or osteoporosis should discuss screening with their caregivers. Ask your caregiver whether you should take a calcium supplement or vitamin D to reduce the rate of osteoporosis.  Menopause can be associated with physical symptoms and risks. Hormone replacement therapy is available to decrease symptoms and risks. You should talk to your caregiver about whether hormone replacement therapy is right for you.  Use sunscreen with sun protection factor (SPF) of 30 or more. Apply sunscreen liberally and repeatedly throughout the day. You should seek shade when your shadow is shorter than you. Protect yourself by wearing long sleeves, pants, a wide-brimmed hat, and sunglasses year round, whenever you are outdoors.  Once a month, do a whole body skin exam, using a mirror to look at the skin on your back. Notify your caregiver of new moles, moles that have irregular borders, moles that are larger than a pencil eraser, or  moles that have changed in shape or color.  Stay current with required immunizations.  Influenza. You need a dose every fall (or winter). The composition of the flu vaccine changes each year, so being vaccinated once is not enough.  Pneumococcal polysaccharide. You need 1 to 2 doses if you smoke cigarettes or if you have certain chronic medical conditions. You need 1 dose at age 8 (or older) if you have never been vaccinated.  Tetanus, diphtheria, pertussis (Tdap, Td). Get 1 dose of Tdap vaccine if you are  younger than age 21, are over 68 and have contact with an infant, are a Research scientist (physical sciences), are pregnant, or simply want to be protected from whooping cough. After that, you need a Td booster dose every 10 years. Consult your caregiver if you have not had at least 3 tetanus and diphtheria-containing shots sometime in your life or have a deep or dirty wound.  HPV. You need this vaccine if you are a woman age 74 or younger. The vaccine is given in 3 doses over 6 months.  Measles, mumps, rubella (MMR). You need at least 1 dose of MMR if you were born in 1957 or later. You may also need a second dose.  Meningococcal. If you are age 70 to 60 and a first-year college student living in a residence hall, or have one of several medical conditions, you need to get vaccinated against meningococcal disease. You may also need additional booster doses.  Zoster (shingles). If you are age 79 or older, you should get this vaccine.  Varicella (chickenpox). If you have never had chickenpox or you were vaccinated but received only 1 dose, talk to your caregiver to find out if you need this vaccine.  Hepatitis A. You need this vaccine if you have a specific risk factor for hepatitis A virus infection or you simply wish to be protected from this disease. The vaccine is usually given as 2 doses, 6 to 18 months apart.  Hepatitis B. You need this vaccine if you have a specific risk factor for hepatitis B virus  infection or you simply wish to be protected from this disease. The vaccine is given in 3 doses, usually over 6 months. Preventive Services / Frequency Ages 22 to 69  Blood pressure check.** / Every 1 to 2 years.  Lipid and cholesterol check.** / Every 5 years beginning at age 84.  Clinical breast exam.** / Every 3 years for women in their 59s and 30s.  Pap test.** / Every 2 years from ages 39 through 29. Every 3 years starting at age 24 through age 93 or 72 with a history of 3 consecutive normal Pap tests.  HPV screening.** / Every 3 years from ages 15 through ages 35 to 41 with a history of 3 consecutive normal Pap tests.  Hepatitis C blood test.** / For any individual with known risks for hepatitis C.  Skin self-exam. / Monthly.  Influenza immunization.** / Every year.  Pneumococcal polysaccharide immunization.** / 1 to 2 doses if you smoke cigarettes or if you have certain chronic medical conditions.  Tetanus, diphtheria, pertussis (Tdap, Td) immunization. / A one-time dose of Tdap vaccine. After that, you need a Td booster dose every 10 years.  HPV immunization. / 3 doses over 6 months, if you are 109 and younger.  Measles, mumps, rubella (MMR) immunization. / You need at least 1 dose of MMR if you were born in 1957 or later. You may also need a second dose.  Meningococcal immunization. / 1 dose if you are age 46 to 45 and a first-year college student living in a residence hall, or have one of several medical conditions, you need to get vaccinated against meningococcal disease. You may also need additional booster doses.  Varicella immunization.** / Consult your caregiver.  Hepatitis A immunization.** / Consult your caregiver. 2 doses, 6 to 18 months apart.  Hepatitis B immunization.** / Consult your caregiver. 3 doses usually over 6 months. Ages 47 to 74  Blood pressure check.** / Every 1 to  2 years.  Lipid and cholesterol check.** / Every 5 years beginning at age  38.  Clinical breast exam.** / Every year after age 20.  Mammogram.** / Every year beginning at age 78 and continuing for as long as you are in good health. Consult with your caregiver.  Pap test.** / Every 3 years starting at age 33 through age 68 or 4 with a history of 3 consecutive normal Pap tests.  HPV screening.** / Every 3 years from ages 24 through ages 22 to 68 with a history of 3 consecutive normal Pap tests.  Fecal occult blood test (FOBT) of stool. / Every year beginning at age 71 and continuing until age 74. You may not need to do this test if you get a colonoscopy every 10 years.  Flexible sigmoidoscopy or colonoscopy.** / Every 5 years for a flexible sigmoidoscopy or every 10 years for a colonoscopy beginning at age 48 and continuing until age 47.  Hepatitis C blood test.** / For all people born from [birth year] through 1966 and any individual with known risks for hepatitis C.  Skin self-exam. / Monthly.  Influenza immunization.** / Every year.  Pneumococcal polysaccharide immunization.** / 1 to 2 doses if you smoke cigarettes or if you have certain chronic medical conditions.  Tetanus, diphtheria, pertussis (Tdap, Td) immunization.** / A one-time dose of Tdap vaccine. After that, you need a Td booster dose every 10 years.  Measles, mumps, rubella (MMR) immunization. / You need at least 1 dose of MMR if you were born in 1957 or later. You may also need a second dose.  Varicella immunization.** / Consult your caregiver.  Meningococcal immunization.** / Consult your caregiver.  Hepatitis A immunization.** / Consult your caregiver. 2 doses, 6 to 18 months apart.  Hepatitis B immunization.** / Consult your caregiver. 3 doses, usually over 6 months. Ages 55 and over  Blood pressure check.** / Every 1 to 2 years.  Lipid and cholesterol check.** / Every 5 years beginning at age 81.  Clinical breast exam.** / Every year after age 67.  Mammogram.** / Every year beginning at  age 79 and continuing for as long as you are in good health. Consult with your caregiver.  Pap test.** / Every 3 years starting at age 50 through age 68 or 31 with a 3 consecutive normal Pap tests. Testing can be stopped between 65 and 70 with 3 consecutive normal Pap tests and no abnormal Pap or HPV tests in the past 10 years.  HPV screening.** / Every 3 years from ages 56 through ages 39 or 55 with a history of 3 consecutive normal Pap tests. Testing can be stopped between 65 and 70 with 3 consecutive normal Pap tests and no abnormal Pap or HPV tests in the past 10 years.  Fecal occult blood test (FOBT) of stool. / Every year beginning at age 50 and continuing until age 16. You may not need to do this test if you get a colonoscopy every 10 years.  Flexible sigmoidoscopy or colonoscopy.** / Every 5 years for a flexible sigmoidoscopy or every 10 years for a colonoscopy beginning at age 28 and continuing until age 9.  Hepatitis C blood test.** / For all people born from [birth year] through 1966 and any individual with known risks for hepatitis C.  Osteoporosis screening.** / A one-time screening for women ages 72 and over and women at risk for fractures or osteoporosis.  Skin self-exam. / Monthly.  Influenza immunization.** / Every year.  Pneumococcal  polysaccharide immunization.** / 1 dose at age 40 (or older) if you have never been vaccinated.  Tetanus, diphtheria, pertussis (Tdap, Td) immunization. / A one-time dose of Tdap vaccine if you are over 65 and have contact with an infant, are a Research scientist (physical sciences), or simply want to be protected from whooping cough. After that, you need a Td booster dose every 10 years.  Varicella immunization.** / Consult your caregiver.  Meningococcal immunization.** / Consult your caregiver.  Hepatitis A immunization.** / Consult your caregiver. 2 doses, 6 to 18 months apart.  Hepatitis B immunization.** / Check with your caregiver. 3 doses, usually over 6  months. ** Family history and personal history of risk and conditions may change your caregiver's recommendations. Document Released: 09/10/2001 Document Revised: 10/07/2011 Document Reviewed: 12/10/2010 [facility] Patient Information 2014 [place], [place].

## 2013-02-10 ENCOUNTER — Encounter: Payer: Self-pay | Admitting: Family Medicine

## 2013-02-10 NOTE — Assessment & Plan Note (Signed)
Encouraged heart healthy diet, regular exercise. Pap is up to date. Encouraged MGM every 1-2 years.

## 2013-02-10 NOTE — Assessment & Plan Note (Signed)
Well treated. No changes 

## 2013-02-10 NOTE — Assessment & Plan Note (Signed)
Mild, start MegaRed krill oil caps, aviud trabs fats,

## 2013-02-10 NOTE — Assessment & Plan Note (Signed)
Encouraged probiotics daily, avoid offending foods.

## 2013-02-10 NOTE — Progress Notes (Signed)
Patient ID: Melinda Kirk, female   DOB: 1971-07-16, 42 y.o.   MRN: [id number] MCKENLEE MANGHAM [id number] [birth year] 02/10/2013      Progress Note-Follow Up  Subjective  Chief Complaint  Chief Complaint  Patient presents with  . Annual Exam    physical/ new patient- transfer from [facility]    HPI  Patient is a 42 year old female in today for annual exam. Overall feeling well. Does have some concerns regarding lactose intolerance and intermittent gas and abdominal discomfort. No bloody or tarry stool. No nausea or vomiting. No diarrhea. No urinary complaints. No recent illness. No chest pain, palpitations, shortness of breath, fevers.  Past Medical History  Diagnosis Date  . GERD (gastroesophageal reflux disease)   . Breast calcification, left 07/29/04  . Thyroid nodule     s/p FNA- 4/09- benign  . Vitamin D deficiency 03/08/2011  . Headache(784.0) 01/03/2013  . Other and unspecified hyperlipidemia 02/08/2013  . Unspecified hypothyroidism 01/15/2011  . Preventative health care 01/22/2011    Past Surgical History  Procedure Laterality Date  . Biopsy thyroid  10/2007    benign biopsy of thyroid nodules.    Family History  Problem Relation Age of Onset  . Stroke Mother   . Arthritis Mother   . Heart disease Father     CHF?    History   Social History  . Marital Status: Married    Spouse Name: N/A    Number of Children: N/A  . Years of Education: N/A   Occupational History  . Not on file.   Social History Main Topics  . Smoking status: Former Smoker -- 5 years  . Smokeless tobacco: Never Used  . Alcohol Use: Yes     Comment: occasional  . Drug Use: No  . Sexually Active: Not on file   Other Topics Concern  . Not on file   Social History Narrative   Regular exercise:  No   Caffeine: 1-2 cups coffee / tea daily.   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     Current Outpatient Prescriptions on File Prior to Visit   Medication Sig Dispense Refill  . esomeprazole (NEXIUM) 40 MG capsule Take 1 capsule (40 mg total) by mouth daily before breakfast.  30 capsule  5  . levonorgestrel (MIRENA) 20 MCG/24HR IUD 1 each by Intrauterine route once.        . naproxen sodium (ANAPROX) 220 MG tablet Take 220 mg by mouth 2 (two) times daily with a meal.      . cyclobenzaprine (FLEXERIL) 10 MG tablet Take 1 tablet (10 mg total) by mouth at bedtime as needed for muscle spasms.  30 tablet  1   No current facility-administered medications on file prior to visit.    No Known Allergies  Review of Systems  Review of Systems  Constitutional: Negative for fever and malaise/fatigue.  HENT: Negative for congestion.   Eyes: Negative for pain and discharge.  Respiratory: Negative for shortness of breath.   Cardiovascular: Negative for chest pain, palpitations and leg swelling.  Gastrointestinal: Negative for nausea, abdominal pain and diarrhea.  Genitourinary: Negative for dysuria.  Musculoskeletal: Negative for falls.  Skin: Negative for rash.  Neurological: Negative for loss of consciousness and headaches.  Endo/Heme/Allergies: Negative for polydipsia.  Psychiatric/Behavioral: Negative for depression and suicidal ideas. The patient is not nervous/anxious and does not have insomnia.     Objective  BP  96/60  Pulse 75  Temp(Src) 98 F (36.7 C) (Oral)  Ht 5' 3.5" (1.613 m)  Wt 122 lb 1.3 oz (55.375 kg)  BMI 21.28 kg/m2  SpO2 97%  LMP 12/31/2012  Physical Exam  Physical Exam  Constitutional: She is oriented to person, place, and time and well-developed, well-nourished, and in no distress. No distress.  HENT:  Head: Normocephalic and atraumatic.  Right Ear: External ear normal.  Left Ear: External ear normal.  Nose: Nose normal.  Mouth/Throat: Oropharynx is clear and moist. No oropharyngeal exudate.  Eyes: Conjunctivae are normal. Pupils are equal, round, and reactive to light. Right eye exhibits no  discharge. Left eye exhibits no discharge. No scleral icterus.  Neck: Normal range of motion. Neck supple. No thyromegaly present.  Cardiovascular: Normal rate, regular rhythm and intact distal pulses.  Exam reveals no gallop.   No murmur heard. Pulmonary/Chest: Effort normal and breath sounds normal. No respiratory distress. She has no wheezes. She has no rales.  Abdominal: Soft. Bowel sounds are normal. She exhibits no distension and no mass. There is no tenderness.  Musculoskeletal: Normal range of motion. She exhibits no edema and no tenderness.  Lymphadenopathy:    She has no cervical adenopathy.  Neurological: She is alert and oriented to person, place, and time. She has normal reflexes. No cranial nerve deficit. Coordination normal.  Skin: Skin is warm and dry. No rash noted. She is not diaphoretic.  Psychiatric: Mood, memory and affect normal.    Lab Results  Component Value Date   TSH 1.196 02/05/2013   Lab Results  Component Value Date   WBC 6.6 02/05/2013   HGB 13.4 02/05/2013   HCT 40.7 02/05/2013   MCV 88.9 02/05/2013   PLT 271 02/05/2013   Lab Results  Component Value Date   CREATININE 0.65 02/05/2013   BUN 14 02/05/2013   NA 139 02/05/2013   K 4.3 02/05/2013   CL 106 02/05/2013   CO2 26 02/05/2013   Lab Results  Component Value Date   ALT 14 02/05/2013   AST 16 02/05/2013   ALKPHOS 48 02/05/2013   BILITOT 0.6 02/05/2013   Lab Results  Component Value Date   CHOL 209* 02/05/2013   Lab Results  Component Value Date   HDL 59 02/05/2013   Lab Results  Component Value Date   LDLCALC 130* 02/05/2013   Lab Results  Component Value Date   TRIG 98 02/05/2013   Lab Results  Component Value Date   CHOLHDL 3.5 02/05/2013     Assessment & Plan  GERD (gastroesophageal reflux disease) Encouraged probiotics daily, avoid offending foods.   Unspecified hypothyroidism Well treated. No changes.  Other and unspecified hyperlipidemia Mild, start MegaRed krill oil caps,  aviud trabs fats,   Preventative health care Encouraged heart healthy diet, regular exercise. Pap is up to date. Encouraged MGM every 1-2 years.

## 2013-03-05 ENCOUNTER — Other Ambulatory Visit (HOSPITAL_COMMUNITY)
Admission: RE | Admit: 2013-03-05 | Discharge: 2013-03-05 | Disposition: A | Discharge status: Home / Self Care | Payer: 59 | Source: Ambulatory Visit | Attending: Family Medicine | Admitting: Family Medicine

## 2013-03-05 ENCOUNTER — Encounter: Payer: Self-pay | Admitting: Family Medicine

## 2013-03-05 ENCOUNTER — Ambulatory Visit (INDEPENDENT_AMBULATORY_CARE_PROVIDER_SITE_OTHER): Payer: 59 | Admitting: Family Medicine

## 2013-03-05 VITALS — BP 100/54 | HR 76 | Temp 98.5°F | Ht 63.5 in | Wt 120.0 lb

## 2013-03-05 DIAGNOSIS — N760 Acute vaginitis: Secondary | ICD-10-CM | POA: Insufficient documentation

## 2013-03-05 DIAGNOSIS — Z113 Encounter for screening for infections with a predominantly sexual mode of transmission: Secondary | ICD-10-CM | POA: Insufficient documentation

## 2013-03-05 DIAGNOSIS — R109 Unspecified abdominal pain: Secondary | ICD-10-CM

## 2013-03-05 DIAGNOSIS — N920 Excessive and frequent menstruation with regular cycle: Secondary | ICD-10-CM

## 2013-03-05 LAB — POCT URINALYSIS DIPSTICK
Glucose, UA: NEGATIVE
Ketones, UA: NEGATIVE
Protein, UA: NEGATIVE
Spec Grav, UA: 1.01
Urobilinogen, UA: 0.2

## 2013-03-05 MED ORDER — TRAMADOL HCL 50 MG PO TABS: 50.0000 mg | ORAL_TABLET | Freq: Three times a day (TID) | ORAL | Status: DC | PRN

## 2013-03-05 NOTE — Patient Instructions (Addendum)
Start a probiotic such as [employer]age daily  Call gyn if symptoms persist when return  Vaginitis Vaginitis is an inflammation of the vagina. It is most often caused by a change in the normal balance of the bacteria and yeast that live in the vagina. This change in balance causes an overgrowth of certain bacteria or yeast, which causes the inflammation. There are different types of vaginitis, but the most common types are:  Bacterial vaginosis.  Yeast infection (candidiasis).  Trichomoniasis vaginitis. This is a sexually transmitted infection (STI).  Viral vaginitis.  Atropic vaginitis.  Allergic vaginitis. CAUSES  The cause depends on the type of vaginitis. Vaginitis can be caused by:  Bacteria (bacterial vaginosis).  Yeast (yeast infection).  A parasite (trichomoniasis vaginitis)  A virus (viral vaginitis).  Low hormone levels (atrophic vaginitis). Low hormone levels can occur during pregnancy, breastfeeding, or after menopause.  Irritants, such as bubble baths, scented tampons, and feminine sprays (allergic vaginitis). Other factors can change the normal balance of the yeast and bacteria that live in the vagina. These include:  Antibiotic medicines.  Poor hygiene.  Diaphragms, vaginal sponges, spermicides, birth control pills, and intrauterine devices (IUD).  Sexual intercourse.  Infection.  Uncontrolled diabetes.  A weakened immune system. SYMPTOMS  Symptoms can vary depending on the cause of the vaginitis. Common symptoms include:  Abnormal vaginal discharge.  The discharge is white, gray, or yellow with bacterial vaginosis.  The discharge is thick, white, and cheesy with a yeast infection.  The discharge is frothy and yellow or greenish with trichomoniasis.  A bad vaginal odor.  The odor is fishy with bacterial vaginosis.  Vaginal itching, pain, or swelling.  Painful intercourse.  Pain or burning when urinating. Sometimes, there are no  symptoms. TREATMENT  Treatment will vary depending on the type of infection.   Bacterial vaginosis and trichomoniasis are often treated with antibiotic creams or pills.  Yeast infections are often treated with antifungal medicines, such as vaginal creams or suppositories.  Viral vaginitis has no cure, but symptoms can be treated with medicines that relieve discomfort. Your sexual partner should be treated as well.  Atrophic vaginitis may be treated with an estrogen cream, pill, suppository, or vaginal ring. If vaginal dryness occurs, lubricants and moisturizing creams may help. You may be told to avoid scented soaps, sprays, or douches.  Allergic vaginitis treatment involves quitting the use of the product that is causing the problem. Vaginal creams can be used to treat the symptoms. HOME CARE INSTRUCTIONS   Take all medicines as directed by your caregiver.  Keep your genital area clean and dry. Avoid soap and only rinse the area with water.  Avoid douching. It can remove the healthy bacteria in the vagina.  Do not use tampons or have sexual intercourse until your vaginitis has been treated. Use sanitary pads while you have vaginitis.  Wipe from front to back. This avoids the spread of bacteria from the rectum to the vagina.  Let air reach your genital area.  Wear cotton underwear to decrease moisture buildup.  Avoid wearing underwear while you sleep until your vaginitis is gone.  Avoid tight pants and underwear or nylons without a cotton panel.  Take off wet clothing (especially bathing suits) as soon as possible.  Use mild, non-scented products. Avoid using irritants, such as:  Scented feminine sprays.  Fabric softeners.  Scented detergents.  Scented tampons.  Scented soaps or bubble baths.  Practice safe sex and use condoms. Condoms may prevent the spread  of trichomoniasis and viral vaginitis. SEEK MEDICAL CARE IF:   You have abdominal pain.  You have a fever or  persistent symptoms for more than 2 3 days.  You have a fever and your symptoms suddenly get worse. Document Released: 05/12/2007 Document Revised: 04/08/2012 Document Reviewed: 12/26/2011 [facility] Patient Information 2014 [place], [place].

## 2013-03-06 LAB — URINALYSIS
Glucose, UA: NEGATIVE mg/dL
Leukocytes, UA: NEGATIVE
Protein, ur: NEGATIVE mg/dL
Specific Gravity, Urine: 1.015 (ref 1.005–1.030)
Urobilinogen, UA: 0.2 mg/dL (ref 0.0–1.0)

## 2013-03-07 ENCOUNTER — Encounter: Payer: Self-pay | Admitting: Family Medicine

## 2013-03-07 DIAGNOSIS — N920 Excessive and frequent menstruation with regular cycle: Secondary | ICD-10-CM

## 2013-03-07 HISTORY — DX: Excessive and frequent menstruation with regular cycle: N92.0

## 2013-03-07 NOTE — Assessment & Plan Note (Signed)
Negative pregnancy test, IUD in place, no cervical tenderness. 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 She is advised not to leave if symptoms worsen. She will see her GYN when she gets back if symptoms persist.

## 2013-03-07 NOTE — Progress Notes (Signed)
Patient ID: Melinda Kirk, female   DOB: [birth year], 42 y.o.   MRN: [id number] SAMMY DOUTHITT [id number] 1971/06/20 03/07/2013      Progress Note-Follow Up  Subjective  Chief Complaint  Chief Complaint  Patient presents with  . menstrual cycle lasting longer than 10 days    HPI  Patient is a 42 year old female who is in today complaining of menstrual bleeding. She has been bleeding for roughly 10 days now. She only has to change her pad 3 times a day but the bleeding is persistent. No clots. She has some mild vaginal discomfort as well sometimes the pain radiates to the right pelvis. No fevers or chills. No GI or GU complaints otherwise. No chest pain, palpitations, shortness of breath. She does have an IUD in place but does not believe that is moved. Is sexually active with a single partner.  Past Medical History  Diagnosis Date  . GERD (gastroesophageal reflux disease)   . Breast calcification, left 07/29/04  . Thyroid nodule     s/p FNA- 4/09- benign  . Vitamin D deficiency 03/08/2011  . Headache(784.0) 01/03/2013  . Other and unspecified hyperlipidemia 02/08/2013  . Unspecified hypothyroidism 01/15/2011  . Preventative health care 01/22/2011  . Menorrhagia 03/07/2013    Past Surgical History  Procedure Laterality Date  . Biopsy thyroid  10/2007    benign biopsy of thyroid nodules.    Family History  Problem Relation Age of Onset  . Stroke Mother   . Arthritis Mother   . Heart disease Father     CHF?    History   Social History  . Marital Status: Married    Spouse Name: N/A    Number of Children: N/A  . Years of Education: N/A   Occupational History  . Not on file.   Social History Main Topics  . Smoking status: Former Smoker -- 5 years  . Smokeless tobacco: Never Used  . Alcohol Use: Yes     Comment: occasional  . Drug Use: No  . Sexually Active: Not on file   Other Topics Concern  . Not on file   Social History Narrative   Regular exercise:  No   Caffeine:  1-2 cups coffee / tea daily.   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     Current Outpatient Prescriptions on File Prior to Visit  Medication Sig Dispense Refill  . cyclobenzaprine (FLEXERIL) 10 MG tablet Take 1 tablet (10 mg total) by mouth at bedtime as needed for muscle spasms.  30 tablet  1  . esomeprazole (NEXIUM) 40 MG capsule Take 1 capsule (40 mg total) by mouth daily before breakfast.  30 capsule  5  . levonorgestrel (MIRENA) 20 MCG/24HR IUD 1 each by Intrauterine route once.        . naproxen sodium (ANAPROX) 220 MG tablet Take 220 mg by mouth 2 (two) times daily with a meal.       No current facility-administered medications on file prior to visit.    No Known Allergies  Review of Systems  Review of Systems  Constitutional: Negative for fever, chills and malaise/fatigue.  HENT: Negative for congestion.   Eyes: Negative for pain and discharge.  Respiratory: Negative for shortness of breath.   Cardiovascular: Negative for chest pain, palpitations and leg swelling.  Gastrointestinal: Negative for nausea, abdominal pain and diarrhea.  Genitourinary: Negative for dysuria.  Musculoskeletal: Negative for falls.  Skin: Negative for rash.  Neurological: Negative for loss of consciousness and headaches.  Endo/Heme/Allergies: Negative for polydipsia.  Psychiatric/Behavioral: Negative for depression and suicidal ideas. The patient is not nervous/anxious and does not have insomnia.     Objective  BP 100/54  Pulse 76  Temp(Src) 98.5 F (36.9 C) (Oral)  Ht 5' 3.5" (1.613 m)  Wt 120 lb (54.432 kg)  BMI 20.92 kg/m2  SpO2 99%  LMP 02/20/2013  Physical Exam  Physical Exam  Constitutional: She is oriented to person, place, and time and well-developed, well-nourished, and in no distress. No distress.  HENT:  Head: Normocephalic and atraumatic.  Eyes: Conjunctivae are normal.  Neck: Neck supple. No thyromegaly present.   Cardiovascular: Normal rate, regular rhythm and normal heart sounds.  Exam reveals no gallop.   No murmur heard. Pulmonary/Chest: Effort normal and breath sounds normal. She has no wheezes.  Abdominal: She exhibits no distension and no mass.  Genitourinary: Vagina normal, uterus normal, cervix normal, right adnexa normal and left adnexa normal.  Maroon blood in vault., bleeding noted from cervix, IUD string in place. No CMT.no discahrge, mild tenderness with palp on right none on left. No cervical lesions.  Musculoskeletal: She exhibits no edema.  Lymphadenopathy:    She has no cervical adenopathy.  Neurological: She is alert and oriented to person, place, and time.  Skin: Skin is warm and dry. No rash noted. She is not diaphoretic.  Psychiatric: Memory, affect and judgment normal.    Lab Results  Component Value Date   TSH 1.196 02/05/2013   Lab Results  Component Value Date   WBC 6.6 02/05/2013   HGB 13.4 02/05/2013   HCT 40.7 02/05/2013   MCV 88.9 02/05/2013   PLT 271 02/05/2013   Lab Results  Component Value Date   CREATININE 0.65 02/05/2013   BUN 14 02/05/2013   NA 139 02/05/2013   K 4.3 02/05/2013   CL 106 02/05/2013   CO2 26 02/05/2013   Lab Results  Component Value Date   ALT 14 02/05/2013   AST 16 02/05/2013   ALKPHOS 48 02/05/2013   BILITOT 0.6 02/05/2013   Lab Results  Component Value Date   CHOL 209* 02/05/2013   Lab Results  Component Value Date   HDL 59 02/05/2013   Lab Results  Component Value Date   LDLCALC 130* 02/05/2013   Lab Results  Component Value Date   TRIG 98 02/05/2013   Lab Results  Component Value Date   CHOLHDL 3.5 02/05/2013     Assessment & Plan  Menorrhagia Negative pregnancy test, IUD in place, no cervical tenderness. 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 She is advised not to leave if symptoms worsen. She will see her GYN when she gets back if symptoms persist.

## 2013-03-08 LAB — CULTURE, URINE COMPREHENSIVE: Colony Count: 15000

## 2013-03-12 MED ORDER — METRONIDAZOLE 500 MG PO TABS: 500.0000 mg | ORAL_TABLET | Freq: Three times a day (TID) | ORAL | Status: DC

## 2013-03-12 NOTE — Addendum Note (Signed)
Addended by: Court Joy on: 03/12/2013 04:56 PM   Modules accepted: Orders

## 2013-06-03 ENCOUNTER — Other Ambulatory Visit: Payer: Self-pay

## 2013-07-07 ENCOUNTER — Other Ambulatory Visit: Payer: Self-pay | Admitting: Family

## 2013-07-08 ENCOUNTER — Other Ambulatory Visit: Payer: Self-pay

## 2013-07-08 DIAGNOSIS — Z1231 Encounter for screening mammogram for malignant neoplasm of breast: Secondary | ICD-10-CM

## 2013-07-09 ENCOUNTER — Telehealth: Payer: Self-pay | Admitting: Family Medicine

## 2013-07-09 ENCOUNTER — Other Ambulatory Visit: Payer: Self-pay

## 2013-07-09 MED ORDER — OMEPRAZOLE-SODIUM BICARBONATE 40-1100 MG PO CAPS: 1.0000 | ORAL_CAPSULE | Freq: Every day | ORAL | Status: DC

## 2013-07-09 NOTE — Telephone Encounter (Signed)
Please advise 

## 2013-07-09 NOTE — Telephone Encounter (Signed)
OK to change, stop Nexium start Zegerid 40 mg po daily Disp 30 day supply with 5 rf or 90 day supply with 1 rf

## 2013-07-09 NOTE — Telephone Encounter (Signed)
Pt states Nexium is not controlling her acid reflux. Pt would like to try Zegrid 40 mg.  Per MD ok to change to Zegrid 40 mg. Daily quantity 30 with 3 refills

## 2013-07-09 NOTE — Telephone Encounter (Signed)
Already done

## 2013-07-09 NOTE — Telephone Encounter (Signed)
Says nexium is not working would like to try zegerid 40mg 

## 2013-07-09 NOTE — Telephone Encounter (Signed)
Left voicemail letting pt know to stop the Nexium (med removed from med list) and to start Zegerid, Rx sent to pharmacy

## 2013-07-13 ENCOUNTER — Ambulatory Visit (INDEPENDENT_AMBULATORY_CARE_PROVIDER_SITE_OTHER): Payer: 59 | Admitting: Family

## 2013-07-13 ENCOUNTER — Encounter: Payer: Self-pay | Admitting: Family

## 2013-07-13 VITALS — BP 110/84 | HR 71 | Temp 98.1°F | Resp 16 | Ht 63.5 in | Wt 119.0 lb

## 2013-07-13 DIAGNOSIS — J209 Acute bronchitis, unspecified: Secondary | ICD-10-CM | POA: Insufficient documentation

## 2013-07-13 DIAGNOSIS — R109 Unspecified abdominal pain: Secondary | ICD-10-CM

## 2013-07-13 MED ORDER — AZITHROMYCIN 250 MG PO TABS: ORAL_TABLET | ORAL | Status: DC

## 2013-07-13 NOTE — Progress Notes (Signed)
Subjective:    Patient ID: Melinda Kirk, female    DOB: 1970/08/09, 42 y.o.   MRN: [id number]  HPI  Melinda Kirk is a 42 yr old female who presents today with chief complaint of cough. Reports cough started about 10 days ago. Denies associated fever. Started with HA, Runny nose, hoarseness.  Cough is improving but nasal congestion is getting  Worse. She reports associated nasal drainage which is white in color.  Sputum is "really yellow."  Some difficulty sleeping due to congestion. Using mucinex without much improvement in her symptoms.   Review of Systems See HPI  Past Medical History  Diagnosis Date  . GERD (gastroesophageal reflux disease)   . Breast calcification, left 07/29/04  . Thyroid nodule     s/p FNA- 4/09- benign  . Vitamin D deficiency 03/08/2011  . Headache(784.0) 01/03/2013  . Other and unspecified hyperlipidemia 02/08/2013  . Unspecified hypothyroidism 01/15/2011  . Preventative health care 01/22/2011  . Menorrhagia 03/07/2013    History   Social History  . Marital Status: Married    Spouse Name: N/A    Number of Children: N/A  . Years of Education: N/A   Occupational History  . Not on file.   Social History Main Topics  . Smoking status: Former Smoker -- 5 years  . Smokeless tobacco: Never Used  . Alcohol Use: Yes     Comment: occasional  . Drug Use: No  . Sexual Activity: Not on file   Other Topics Concern  . Not on file   Social History Narrative   Regular exercise:  No   Caffeine: 1-2 cups coffee / tea daily.   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     Past Surgical History  Procedure Laterality Date  . Biopsy thyroid  10/2007    benign biopsy of thyroid nodules.    Family History  Problem Relation Age of Onset  . Stroke Mother   . Arthritis Mother   . Heart disease Father     CHF?    No Known Allergies  Current Outpatient Prescriptions on File Prior to Visit  Medication Sig  Dispense Refill  . cyclobenzaprine (FLEXERIL) 10 MG tablet Take 1 tablet (10 mg total) by mouth at bedtime as needed for muscle spasms.  30 tablet  1  . levonorgestrel (MIRENA) 20 MCG/24HR IUD 1 each by Intrauterine route once.        [name] omeprazole-sodium bicarbonate (ZEGERID) 40-1100 MG per capsule Take 1 capsule by mouth daily before breakfast.  30 capsule  5  . traMADol (ULTRAM) 50 MG tablet Take 1 tablet (50 mg total) by mouth every 8 (eight) hours as needed for pain.  30 tablet  1   No current facility-administered medications on file prior to visit.    BP 110/84  Pulse 71  Temp(Src) 98.1 F (36.7 C) (Oral)  Resp 16  Ht 5' 3.5" (1.613 m)  Wt 119 lb (53.978 kg)  BMI 20.75 kg/m2  SpO2 97%       Objective:   Physical Exam  Constitutional: She is oriented to person, place, and time. She appears well-developed and well-nourished. No distress.  HENT:  Head: Normocephalic and atraumatic.  Right Ear: Tympanic membrane and ear canal normal.  Left Ear: Tympanic membrane and ear canal normal.  Mouth/Throat: No posterior oropharyngeal edema or posterior oropharyngeal erythema.  No facial or maxillary sinus tenderness to palpation.  Cardiovascular: Normal rate and regular rhythm.   No murmur heard. Pulmonary/Chest: Effort normal and breath sounds normal. No respiratory distress. She has no wheezes. She has no rales. She exhibits no tenderness.  Neurological: She is alert and oriented to person, place, and time.  Psychiatric: She has a normal mood and affect. Her behavior is normal. Judgment and thought content normal.          Assessment & Plan:

## 2013-07-13 NOTE — Assessment & Plan Note (Signed)
Will rx with zithromax.  Add sudafed prn nasal congestion.  Pt instructed to call if symptoms worsen or if symptoms do not improve.

## 2013-07-13 NOTE — Progress Notes (Signed)
Pre visit review using our clinic review tool, if applicable. No additional management support is needed unless otherwise documented below in the visit note. 

## 2013-07-13 NOTE — Patient Instructions (Signed)
Call if symptoms worsen or if not improved in 2-3 days.  You may add sudafed as needed for nasal congestion.

## 2013-07-19 ENCOUNTER — Ambulatory Visit (INDEPENDENT_AMBULATORY_CARE_PROVIDER_SITE_OTHER): Payer: 59 | Admitting: Physician Assistant

## 2013-07-19 ENCOUNTER — Encounter: Payer: Self-pay | Admitting: Physician Assistant

## 2013-07-19 ENCOUNTER — Ambulatory Visit (HOSPITAL_BASED_OUTPATIENT_CLINIC_OR_DEPARTMENT_OTHER)
Admission: RE | Admit: 2013-07-19 | Discharge: 2013-07-19 | Disposition: A | Discharge status: Home / Self Care | Payer: 59 | Source: Ambulatory Visit | Attending: Physician Assistant | Admitting: Physician Assistant

## 2013-07-19 VITALS — BP 100/72 | HR 73 | Temp 98.3°F | Resp 16 | Ht 63.5 in | Wt 118.0 lb

## 2013-07-19 DIAGNOSIS — R059 Cough, unspecified: Secondary | ICD-10-CM | POA: Insufficient documentation

## 2013-07-19 DIAGNOSIS — R05 Cough: Secondary | ICD-10-CM | POA: Insufficient documentation

## 2013-07-19 DIAGNOSIS — J209 Acute bronchitis, unspecified: Secondary | ICD-10-CM

## 2013-07-19 DIAGNOSIS — J449 Chronic obstructive pulmonary disease, unspecified: Secondary | ICD-10-CM | POA: Insufficient documentation

## 2013-07-19 DIAGNOSIS — J4489 Other specified chronic obstructive pulmonary disease: Secondary | ICD-10-CM | POA: Insufficient documentation

## 2013-07-19 MED ORDER — HYDROCOD POLST-CHLORPHEN POLST 10-8 MG/5ML PO LQCR: 5.0000 mL | Freq: Two times a day (BID) | ORAL | Status: DC | PRN

## 2013-07-19 MED ORDER — ALBUTEROL SULFATE HFA 108 (90 BASE) MCG/ACT IN AERS: 2.0000 | INHALATION_SPRAY | Freq: Four times a day (QID) | RESPIRATORY_TRACT | Status: DC | PRN

## 2013-07-19 NOTE — Progress Notes (Signed)
Patient ID: Melinda Kirk, female   DOB: [birth year], 42 y.o.   MRN: [id number]  Patient presents to clinic today complaining of continual dry cough. Patient was seen on 07/13/2013 and diagnosed with acute bronchitis. Patient given prescription for azithromycin. Patient states that other symptoms have improved she has a lingering cough and occasional chest tightness. Denies history of COPD or asthma. Denies history of significant seasonal allergies. Denies recent travel or sick contact. Patient denies chest pain, fever, sinus pressure, sinus pain or shortness of breath. Denies wheezing. Patient states the cough is keeping her up at night.  Past Medical History  Diagnosis Date  . GERD (gastroesophageal reflux disease)   . Breast calcification, left 07/29/04  . Thyroid nodule     s/p FNA- 4/09- benign  . Vitamin D deficiency 03/08/2011  . Headache(784.0) 01/03/2013  . Other and unspecified hyperlipidemia 02/08/2013  . Unspecified hypothyroidism 01/15/2011  . Preventative health care 01/22/2011  . Menorrhagia 03/07/2013    Current Outpatient Prescriptions on File Prior to Visit  Medication Sig Dispense Refill  . Ascorbic Acid (VITAMIN C) 100 MG tablet Take 100 mg by mouth daily.      . Calcium Carbonate-Vitamin D (CALTRATE 600+D PO) Take 1 tablet by mouth daily.      . cyclobenzaprine (FLEXERIL) 10 MG tablet Take 1 tablet (10 mg total) by mouth at bedtime as needed for muscle spasms.  30 tablet  1  . levonorgestrel (MIRENA) 20 MCG/24HR IUD 1 each by Intrauterine route once.        . Multiple Vitamins-Minerals (CENTRUM PO) Take 1 tablet by mouth daily.      . Omega-3 Fatty Acids (FISH OIL PO) Take 1 capsule by mouth daily.      [name] omeprazole-sodium bicarbonate (ZEGERID) 40-1100 MG per capsule Take 1 capsule by mouth daily before breakfast.  30 capsule  5  . traMADol (ULTRAM) 50 MG tablet Take 1 tablet (50 mg total) by mouth every 8 (eight) hours as needed for pain.  30 tablet  1   No current  facility-administered medications on file prior to visit.    No Known Allergies  Family History  Problem Relation Age of Onset  . Stroke Mother   . Arthritis Mother   . Heart disease Father     CHF?    History   Social History  . Marital Status: Married    Spouse Name: N/A    Number of Children: N/A  . Years of Education: N/A   Social History Main Topics  . Smoking status: Former Smoker -- 5 years  . Smokeless tobacco: Never Used  . Alcohol Use: Yes     Comment: occasional  . Drug Use: No  . Sexual Activity: None   Other Topics Concern  . None   Social History Narrative   Regular exercise:  No   Caffeine: 1-2 cups coffee / tea daily.   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     Review of Systems - See HPI.  All other ROS are negative.  Filed Vitals:   07/19/13 1527  BP: 100/72  Pulse: 73  Temp: 98.3 F (36.8 C)  Resp: 16    Physical Exam  Vitals reviewed. Constitutional: She is oriented to person, place, and time and well-developed, well-nourished, and in no distress.  HENT:  Head: Normocephalic and atraumatic.  Right Ear: External ear normal.  Left Ear: External ear normal.  Nose: Nose normal.  Mouth/Throat: Oropharynx is clear and moist. No oropharyngeal exudate.  Tympanic membrane within normal limits bilaterally. No tenderness to percussion of sinuses noted on exam.  Eyes: Conjunctivae are normal. Pupils are equal, round, and reactive to light.  Neck: Neck supple.  Cardiovascular: Normal rate, regular rhythm and normal heart sounds.   Pulmonary/Chest: Effort normal and breath sounds normal. No respiratory distress. She has no wheezes. She has no rales. She exhibits no tenderness.  Lymphadenopathy:    She has no cervical adenopathy.  Neurological: She is alert and oriented to person, place, and time.  Skin: Skin is warm and dry. No rash noted.  Psychiatric: Affect normal.    Assessment/Plan: Acute bronchitis Obtain chest x-ray to rule out pneumonia. Cough is most likely residual from resolving infection. Patient educated that cough may be present for up to one month following a respiratory illness, while the respiratory tract is healing. Rx Tussionex. Albuterol inhaler given for chest tightness. Increase fluid intake. Rest. Humidifier in bedroom.

## 2013-07-19 NOTE — Patient Instructions (Signed)
Please go downstairs for x-ray.  Increase fluid intake.  Mucinex-D.  Take Tussionex as directed for cough.  We will start an additional antibiotic if indicated once x-ray results are in.

## 2013-07-19 NOTE — Progress Notes (Signed)
Pre visit review using our clinic review tool, if applicable. No additional management support is needed unless otherwise documented below in the visit note/SLS  

## 2013-07-20 ENCOUNTER — Telehealth: Payer: Self-pay | Admitting: *Deleted

## 2013-07-20 NOTE — Telephone Encounter (Signed)
Notified pt. She voices understanding and has picked up albuterol inhaler. Reports some improvement in her cough. Advised pt to let us know if symptoms worsen or do not continue to improve.

## 2013-07-20 NOTE — Assessment & Plan Note (Signed)
Obtain chest x-ray to rule out pneumonia. Cough is most likely residual from resolving infection. Patient educated that cough may be present for up to one month following a respiratory illness, while the respiratory tract is healing. Rx Tussionex. Albuterol inhaler given for chest tightness. Increase fluid intake. Rest. Humidifier in bedroom.

## 2013-07-20 NOTE — Telephone Encounter (Signed)
Message copied by Kathi Simpers on Tue Jul 20, 2013  3:59 PM ------      Message from: Marcelline Mates      Created: Mon Jul 19, 2013  4:46 PM       Chest x-ray reveal no evidence of pneumonia.  Does shoe reactive airway disease -- possibly a little bit of bronchitic asthma.  Will send in Rx for albuterol inhaler to take.  Do not think she needs another antibiotic at this point. ------

## 2013-07-27 ENCOUNTER — Ambulatory Visit: Payer: 59 | Admitting: Family Medicine

## 2013-07-29 DIAGNOSIS — I639 Cerebral infarction, unspecified: Secondary | ICD-10-CM

## 2013-07-29 HISTORY — DX: Cerebral infarction, unspecified: I63.9

## 2013-08-02 ENCOUNTER — Ambulatory Visit: Payer: 59 | Admitting: Family

## 2013-08-10 ENCOUNTER — Telehealth: Payer: Self-pay | Admitting: Family Medicine

## 2013-08-10 ENCOUNTER — Ambulatory Visit
Admission: RE | Admit: 2013-08-10 | Discharge: 2013-08-10 | Disposition: A | Discharge status: Home / Self Care | Payer: 59 | Source: Ambulatory Visit

## 2013-08-10 DIAGNOSIS — Z1231 Encounter for screening mammogram for malignant neoplasm of breast: Secondary | ICD-10-CM

## 2013-08-10 NOTE — Telephone Encounter (Signed)
Patient needs a letter faxed to her HR department [phone] stating she is ok to return to work on 08-02-2013

## 2013-08-11 ENCOUNTER — Encounter: Payer: Self-pay | Admitting: Physician Assistant

## 2013-08-11 NOTE — Telephone Encounter (Signed)
Letter written, printed, signed, placed in envelope and left at front desk, ready for pickup. Please call patient and let her know it is ready.

## 2013-08-11 NOTE — Telephone Encounter (Signed)
Called patient  She advised it was to be faxed to her HR.  Faxed letter to her HR

## 2013-08-12 ENCOUNTER — Other Ambulatory Visit: Payer: Self-pay | Admitting: Obstetrics and Gynecology

## 2013-08-12 DIAGNOSIS — R928 Other abnormal and inconclusive findings on diagnostic imaging of breast: Secondary | ICD-10-CM

## 2013-08-27 ENCOUNTER — Ambulatory Visit
Admission: RE | Admit: 2013-08-27 | Discharge: 2013-08-27 | Disposition: A | Discharge status: Home / Self Care | Payer: 59 | Source: Ambulatory Visit | Attending: Obstetrics and Gynecology | Admitting: Obstetrics and Gynecology

## 2013-08-27 DIAGNOSIS — R928 Other abnormal and inconclusive findings on diagnostic imaging of breast: Secondary | ICD-10-CM

## 2013-08-30 ENCOUNTER — Other Ambulatory Visit: Payer: Self-pay | Admitting: Obstetrics and Gynecology

## 2013-12-07 ENCOUNTER — Telehealth: Payer: Self-pay | Admitting: Family Medicine

## 2013-12-07 NOTE — Telephone Encounter (Signed)
Patient never picked up letter from 08/11/13

## 2014-01-23 DIAGNOSIS — Q2112 Patent foramen ovale: Secondary | ICD-10-CM | POA: Insufficient documentation

## 2014-01-23 DIAGNOSIS — Q211 Atrial septal defect: Secondary | ICD-10-CM | POA: Insufficient documentation

## 2014-01-24 ENCOUNTER — Other Ambulatory Visit: Payer: Self-pay | Admitting: Family Medicine

## 2014-01-24 DIAGNOSIS — Q211 Atrial septal defect: Secondary | ICD-10-CM

## 2014-01-24 DIAGNOSIS — Q2112 Patent foramen ovale: Secondary | ICD-10-CM

## 2014-01-25 ENCOUNTER — Encounter: Payer: Self-pay | Admitting: Family

## 2014-01-25 ENCOUNTER — Ambulatory Visit (INDEPENDENT_AMBULATORY_CARE_PROVIDER_SITE_OTHER): Payer: 59 | Admitting: Family

## 2014-01-25 ENCOUNTER — Telehealth: Payer: Self-pay | Admitting: Family Medicine

## 2014-01-25 VITALS — BP 94/68 | HR 77 | Temp 98.1°F | Resp 16 | Ht 63.5 in | Wt 119.0 lb

## 2014-01-25 DIAGNOSIS — Z8673 Personal history of transient ischemic attack (TIA), and cerebral infarction without residual deficits: Secondary | ICD-10-CM

## 2014-01-25 NOTE — Telephone Encounter (Signed)
Received medical records from [facility]

## 2014-01-25 NOTE — Progress Notes (Signed)
Subjective:    Patient ID: Melinda Kirk, female    DOB: 08-Dec-1970, 43 y.o.   MRN: [id number]  HPI  Melinda Kirk is a 43 yr old female who presents today for hospital follow up. Reports that last Saturday while she was at a family picnic, she developed tingling in her left hand.  Left hand and arm then became numb. Then her tongue became numb and she developed Dysarthria.  Had brief aphasia with altared mental status.  Developed pallor.  EMS was summoned. CT scan, MRI, CTA contrast, 2D echo were performed per pt at [place] [facility]. Records are requested but unavailable for review at time of visit. Per pt she was told that she suffered a mild stroke and 2D echo noted PFO. She has been told to follow up with neurology and to establish with cardiology to be evaluated for PFO closure.  She was started on aspirin during her hospitalization.  Since returning home, the patient reports HA and dizziness. Was told that her MRI showed "mild stroke."  Was sent home on aspirin 81mg .  Was told lipids were "good.     Review of Systems See HPI  Past Medical History  Diagnosis Date  . GERD (gastroesophageal reflux disease)   . Breast calcification, left 07/29/04  . Thyroid nodule     s/p FNA- 4/09- benign  . Vitamin D deficiency 03/08/2011  . Headache(784.0) 01/03/2013  . Other and unspecified hyperlipidemia 02/08/2013  . Unspecified hypothyroidism 01/15/2011  . Preventative health care 01/22/2011  . Menorrhagia 03/07/2013    History   Social History  . Marital Status: Married    Spouse Name: N/A    Number of Children: N/A  . Years of Education: N/A   Occupational History  . Not on file.   Social History Main Topics  . Smoking status: Former Smoker -- 5 years  . Smokeless tobacco: Never Used  . Alcohol Use: Yes     Comment: occasional  . Drug Use: No  . Sexual Activity: Not on file   Other Topics Concern  . Not on file   Social History Narrative   Regular exercise:  No   Caffeine:  1-2 cups coffee / tea daily.   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     Past Surgical History  Procedure Laterality Date  . Biopsy thyroid  10/2007    benign biopsy of thyroid nodules.    Family History  Problem Relation Age of Onset  . Stroke Mother   . Arthritis Mother   . Heart disease Father     CHF?    No Known Allergies  Current Outpatient Prescriptions on File Prior to Visit  Medication Sig Dispense Refill  . Ascorbic Acid (VITAMIN C) 100 MG tablet Take 100 mg by mouth daily.      . Calcium Carbonate-Vitamin D (CALTRATE 600+D PO) Take 1 tablet by mouth daily.      [name] levonorgestrel (MIRENA) 20 MCG/24HR IUD 1 each by Intrauterine route once.        . Multiple Vitamins-Minerals (CENTRUM PO) Take 1 tablet by mouth daily.      . Omega-3 Fatty Acids (FISH OIL PO) Take 1 capsule by mouth daily.      [name] omeprazole-sodium bicarbonate (ZEGERID) 40-1100 MG per capsule Take 1 capsule by mouth daily before breakfast.  30 capsule  5  . traMADol (ULTRAM) 50 MG tablet Take 1  tablet (50 mg total) by mouth every 8 (eight) hours as needed for pain.  30 tablet  1   No current facility-administered medications on file prior to visit.    BP 94/68  Pulse 77  Temp(Src) 98.1 F (36.7 C) (Oral)  Resp 16  Ht 5' 3.5" (1.613 m)  Wt 119 lb 0.6 oz (53.996 kg)  BMI 20.75 kg/m2  SpO2 99%       Objective:   Physical Exam  Constitutional: She is oriented to person, place, and time. She appears well-developed and well-nourished. No distress.  HENT:  Head: Normocephalic and atraumatic.  Cardiovascular: Normal rate and regular rhythm.   No murmur heard. Pulmonary/Chest: Effort normal and breath sounds normal. No respiratory distress. She has no wheezes. She has no rales. She exhibits no tenderness.  Lymphadenopathy:    She has no cervical adenopathy.  Neurological: She is alert and oriented to person, place, and time.  Bilateral hand  grasps 5/5 Bilateral UE/LE strength is 5/5 A and O x 3 + facial symmetry Speech is clear.  Psychiatric: She has a normal mood and affect. Her behavior is normal. Judgment and thought content normal.          Assessment & Plan:

## 2014-01-25 NOTE — Progress Notes (Signed)
Pre visit review using our clinic review tool, if applicable. No additional management support is needed unless otherwise documented below in the visit note. 

## 2014-01-25 NOTE — Patient Instructions (Signed)
You will be contacted about your referral to neurology and cardiology. Continue aspirin. Go to ER if you develop recurrent weakness/numbness. Follow up with Dr. Charlett Blake in 3 months.

## 2014-01-26 ENCOUNTER — Ambulatory Visit: Payer: 59 | Admitting: Family

## 2014-01-26 ENCOUNTER — Encounter: Payer: Self-pay | Admitting: Family

## 2014-01-26 NOTE — Telephone Encounter (Signed)
Melinda Kirk  Could you please remove work letter from [employer]?  Ok to stamp  My name Melinda Kirk and Melinda Kirk both have a stamp.  I told pt I would leave it at the front desk.    Thanks

## 2014-01-27 ENCOUNTER — Telehealth: Payer: Self-pay | Admitting: Family Medicine

## 2014-01-27 ENCOUNTER — Encounter: Payer: Self-pay | Admitting: Family

## 2014-01-27 DIAGNOSIS — I639 Cerebral infarction, unspecified: Secondary | ICD-10-CM | POA: Insufficient documentation

## 2014-01-27 MED ORDER — OMEPRAZOLE-SODIUM BICARBONATE 40-1100 MG PO CAPS: 1.0000 | ORAL_CAPSULE | Freq: Every day | ORAL | Status: DC

## 2014-01-27 NOTE — Telephone Encounter (Signed)
Patient came to pick up note for work  You put in she could return on 7/2.  She expected to be out a week.  She would be able to return on 7-6  Could she pick up a new letter

## 2014-01-27 NOTE — Telephone Encounter (Signed)
Refill- omeprazole  [facility]

## 2014-01-27 NOTE — Assessment & Plan Note (Signed)
43 yr old female with recent CVA. She has no obvious neuro deficits at this time.  Await hospital records for review.  Will refer to neurology and cardiology (due to PFO).

## 2014-01-27 NOTE — Telephone Encounter (Signed)
Rx sent 

## 2014-02-01 ENCOUNTER — Telehealth: Payer: Self-pay | Admitting: *Deleted

## 2014-02-01 NOTE — Telephone Encounter (Signed)
Message copied by Debbrah Alar on Tue Feb 01, 2014 12:36 PM ------      Message from: Varney Daily      Created: Tue Feb 01, 2014 12:27 PM                   ----- Message -----         From: Irven Baltimore         Sent: 02/01/2014  12:02 PM           To: Varney Daily, RMA            I called medical records and they are going to look for these      ----- Message -----         From: Varney Daily, RMA         Sent: 02/01/2014   6:55 AM           To: Irven Baltimore            Can you call scanning please       ----- Message -----         From: Mosie Lukes, MD         Sent: 01/31/2014   7:47 PM           To: Debbrah Alar, NP, Varney Daily, RMA            I do not have them when she was here with her husband I had them request them. If they came in already they are on the way to scanning, we will call and see if they are waiting to be scanned      ----- Message -----         From: Debbrah Alar, NP         Sent: 01/31/2014   6:08 PM           To: Mosie Lukes, MD            Hi,            I saw her for hospital follow up. Did you get her hospital records by chance? If so, can please review them?            Thanks,            Takeya Marquis      ----- Message -----         From: Ronny Flurry, CMA         Sent: 01/31/2014   8:15 AM           To: Debbrah Alar, NP            There is a note in pt's chart dated 01/25/14 that records were received from [facility] but I do not have them. They may have been forwarded to Dr Charlett Blake since she is listed as pt's PCP. Do not see them here.                  ----- Message -----         From: Debbrah Alar, NP         Sent: 01/27/2014  10:29 PM           To: Ronny Flurry, CMA            Have we gotten her records yet? thanks                               ------

## 2014-02-01 NOTE — Telephone Encounter (Signed)
Received FMLA paperwork. Spoke with pt. She would like form to cover hospital visit on 6/27-6/28 through 01/31/14. Forms forwarded to Provider for completion.

## 2014-02-03 ENCOUNTER — Ambulatory Visit: Payer: 59 | Admitting: Neurology

## 2014-02-04 ENCOUNTER — Telehealth: Payer: Self-pay | Admitting: *Deleted

## 2014-02-04 ENCOUNTER — Encounter: Payer: Self-pay | Admitting: *Deleted

## 2014-02-04 ENCOUNTER — Encounter: Payer: Self-pay | Admitting: Cardiovascular Disease

## 2014-02-04 ENCOUNTER — Ambulatory Visit (INDEPENDENT_AMBULATORY_CARE_PROVIDER_SITE_OTHER): Payer: 59 | Admitting: Cardiovascular Disease

## 2014-02-04 ENCOUNTER — Other Ambulatory Visit: Payer: Self-pay | Admitting: Cardiovascular Disease

## 2014-02-04 VITALS — BP 103/52 | HR 80 | Ht 63.5 in | Wt 117.8 lb

## 2014-02-04 DIAGNOSIS — Q248 Other specified congenital malformations of heart: Secondary | ICD-10-CM

## 2014-02-04 DIAGNOSIS — I6350 Cerebral infarction due to unspecified occlusion or stenosis of unspecified cerebral artery: Secondary | ICD-10-CM

## 2014-02-04 DIAGNOSIS — I639 Cerebral infarction, unspecified: Secondary | ICD-10-CM

## 2014-02-04 LAB — CBC WITH DIFFERENTIAL/PLATELET
Basophils Absolute: 0 10*3/uL (ref 0.0–0.1)
Basophils Relative: 0.7 % (ref 0.0–3.0)
EOS PCT: 1.1 % (ref 0.0–5.0)
Eosinophils Absolute: 0.1 10*3/uL (ref 0.0–0.7)
HCT: 37.2 % (ref 36.0–46.0)
Hemoglobin: 12.4 g/dL (ref 12.0–15.0)
Lymphocytes Relative: 30.2 % (ref 12.0–46.0)
Lymphs Abs: 2 10*3/uL (ref 0.7–4.0)
MCHC: 33.3 g/dL (ref 30.0–36.0)
MCV: 90 fl (ref 78.0–100.0)
MONOS PCT: 4.7 % (ref 3.0–12.0)
Monocytes Absolute: 0.3 10*3/uL (ref 0.1–1.0)
NEUTROS PCT: 63.3 % (ref 43.0–77.0)
Neutro Abs: 4.1 10*3/uL (ref 1.4–7.7)
Platelets: 215 10*3/uL (ref 150.0–400.0)
RBC: 4.14 Mil/uL (ref 3.87–5.11)
RDW: 13.6 % (ref 11.5–15.5)
WBC: 6.6 10*3/uL (ref 4.0–10.5)

## 2014-02-04 LAB — BASIC METABOLIC PANEL
BUN: 13 mg/dL (ref 6–23)
CO2: 26 mEq/L (ref 19–32)
CREATININE: 0.5 mg/dL (ref 0.4–1.2)
Calcium: 9.2 mg/dL (ref 8.4–10.5)
Chloride: 105 mEq/L (ref 96–112)
GFR: 139.14 mL/min (ref >60.00)
Glucose, Bld: 88 mg/dL (ref 70–99)
POTASSIUM: 4 meq/L (ref 3.5–5.1)
Sodium: 137 mEq/L (ref 135–145)

## 2014-02-04 LAB — PROTIME-INR
INR: 0.9 ratio (ref 0.8–1.0)
Prothrombin Time: 10.3 s (ref 9.6–13.1)

## 2014-02-04 NOTE — Patient Instructions (Signed)
Your physician recommends that you continue on your current medications as directed. Please refer to the Current Medication list given to you today.  Your physician has requested that you have a TEE. During a TEE, sound waves are used to create images of your heart. It provides your doctor with information about the size and shape of your heart and how well your heart's chambers and valves are working. In this test, a transducer is attached to the end of a flexible tube that's guided down your throat and into your esophagus (the tube leading from you mouth to your stomach) to get a more detailed image of your heart. You are not awake for the procedure. Please see the instruction sheet given to you today. For further information please visit [email or web address].   Your physician recommends that you schedule a follow-up appointment BY RETURN CALL BY NURSE   Your physician recommends that you return for lab work in: BMET, CBC INR

## 2014-02-04 NOTE — Telephone Encounter (Signed)
Left detailed message on pt's cell# re: FMLA completion and that forms have been faxed to her employer.

## 2014-02-04 NOTE — Progress Notes (Signed)
HPI:  43 year old woman presenting for evaluation of interatrial septal communication after presenting with a TIA.  The patient was in her normal state of health when she developed sudden onset of left arm tingling, facial numbness, and dysarthria. She presented to [facility] on June 27 and underwent inpatient stroke evaluation. This included a hypercoagulable panel, an echocardiogram, CT of the brain, CTA, and MRI. She has no traditional risk factors for stroke. Pertinent findings included interatrial septal aneurysm and strongly positive agitated saline study with right-to-left intracardiac shunting. CTA demonstrated no evidence of extracranial arterial disease. 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 Cardiology evaluation was recommended for consideration of PFO closure.  The patient was with her family at the park. She was sitting at a table and had been sitting with her legs crossed. She suddenly developed numbness and tingling in the fingers of her left arm. She then developed weakness/clumsiness of the left arm. She reports that she was unable to speak, but could hear people talking to her. There was some mild left leg weakness associated. EMS was called. Her symptoms resolved by the time she arrived to the emergency department. She estimates the total duration of her episode was about 30 minutes. She's had no previous neurologic symptoms. She denies chest pain, palpitations, edema, orthopnea, or PND. She does complain of some residual dizziness since the time of her TIA.  Outpatient Encounter Prescriptions as of 02/04/2014  Medication Sig  . Ascorbic Acid (VITAMIN C) 100 MG tablet Take 100 mg by mouth daily.  [name] aspirin EC 81 MG tablet Take 81 mg by mouth daily.  . Calcium Carbonate-Vitamin D (CALTRATE 600+D PO) Take 1 tablet by mouth daily.  [name] levonorgestrel (MIRENA) 20  MCG/24HR IUD 1 each by Intrauterine route once.    . Multiple Vitamins-Minerals (CENTRUM PO) Take 1 tablet by mouth daily.  . Omega-3 Fatty Acids (FISH OIL PO) Take 1 capsule by mouth daily.  [name] omeprazole-sodium bicarbonate (ZEGERID) 40-1100 MG per capsule Take 1 capsule by mouth daily before breakfast.  . traMADol (ULTRAM) 50 MG tablet Take 1 tablet (50 mg total) by mouth every 8 (eight) hours as needed for pain.    Review of patient's allergies indicates no known allergies.  Past Medical History  Diagnosis Date  . GERD (gastroesophageal reflux disease)   . Breast calcification, left 07/29/04  . Thyroid nodule     s/p FNA- 4/09- benign  . Vitamin D deficiency 03/08/2011  . Headache(784.0) 01/03/2013  . Other and unspecified hyperlipidemia 02/08/2013  . Unspecified hypothyroidism 01/15/2011  . Preventative health care 01/22/2011  . Menorrhagia 03/07/2013    Past Surgical History  Procedure Laterality Date  . Biopsy thyroid  10/2007    benign biopsy of thyroid nodules.    History   Social History  . Marital Status: Married    Spouse Name: N/A    Number of Children: N/A  . Years of Education: N/A   Occupational History  . Not on file.   Social History Main Topics  . Smoking status: Former Smoker -- 5 years  . Smokeless tobacco: Never Used  . Alcohol Use: Yes     Comment: occasional  . Drug Use: No  . Sexual Activity: Not on file   Other Topics Concern  . Not on file   Social History Narrative   Regular exercise:  No   Caffeine: 1-2 cups  coffee / tea daily.   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     Family History  Problem Relation Age of Onset  . Stroke Mother   . Arthritis Mother   . Heart disease Father     CHF?    ROS:  General: no fevers/chills/night sweats Eyes: no blurry vision, diplopia, or amaurosis ENT: no sore throat or hearing loss Resp: no cough, wheezing, or hemoptysis CV: no edema or  palpitations GI: no abdominal pain, nausea, vomiting, diarrhea, or constipation GU: no dysuria, frequency, or hematuria Skin: no rash Neuro: See history of present illness Musculoskeletal: no joint pain or swelling Heme: no bleeding, DVT, or easy bruising Endo: no polydipsia or polyuria  BP 103/52  Pulse 80  Ht 5' 3.5" (1.613 m)  Wt 53.434 kg (117 lb 12.8 oz)  BMI 20.54 kg/m2  PHYSICAL EXAM: Pt is alert and oriented, WD, WN, in no distress. HEENT: normal Neck: JVP normal. Carotid upstrokes normal without bruits. No thyromegaly. Lungs: equal expansion, clear bilaterally CV: Apex is discrete and nondisplaced, RRR without murmur or gallop Abd: soft, NT, +BS, no bruit, no hepatosplenomegaly Back: no CVA tenderness Ext: no C/C/E        DP/PT pulses intact and = Skin: warm and dry without rash Neuro: CNII-XII intact             Strength intact = bilaterally  Lab results: Total cholesterol 155, triglycerides 67, HDL 52, LDL 90 Beta-2 glycoprotein antibody panel negative Anti-cardiolipin antibodies negative Prothrombin gene mutation negative Factor V Leiden negative Proteins C. and S. antigens normal Lupus anticoagulant profile negative Anti-thrombin 3 level normal  2-D echocardiogram: Interpretation Summary  A portable transthoracic echocardiogram with 2-D, color flow, and Doppler  was performed. Saline contrast injection was performed. A complete portable  two-dimensional transthoracic echocardiogram was performed (2D, M-mode,  Doppler and color flow Doppler). The left ventricle is normal in size, wall  thickness and wall motion with ejection fraction of 60-65%.  The left ventricular diastolic function is normal.  Injection of contrast documented an interatrial shunt .  A patent foramen ovale is suspected.  There is mild (1+) tricuspid regurgitation.  Right ventricular systolic pressure is normal.  Left Ventricle  The left ventricle is normal in size, wall thickness and  wall motion with  ejection fraction of 60-65%. The left ventricular diastolic function is  normal.  Right Ventricle  The right ventricle is normal in structure and function.  Atria  The left atrium is normal size. Injection of contrast documented an  interatrial shunt . A patent foramen ovale is suspected. Mobile interatrial  septum, markedly positive bubble study. The right atrium is normal.  Mitral Valve  The mitral valve is mildly thickened with mild [1+] regurgitation.  Tricuspid Valve  The tricuspid valve leaflets are thin and pliable and the valve motion is  normal. There is mild (1+) tricuspid regurgitation. Right ventricular  systolic pressure is normal.  Aortic Valve  The aortic valve is normal in structure and function.  Pulmonic Valve  The pulmonic valve leaflets are thin and pliable; valve motion is normal.  Vessels  The aortic root is normal in diameter.  Pericardium  There is no pericardial effusion.   MRI BRAIN HEAD WO CONTRAST - Final result (01/23/2014 9:38 AM EDT)  MRI BRAIN HEAD WO CONTRAST - Final result (01/23/2014 9:38 AM EDT)  Impressions  IMPRESSION: Recent infarct/infarcts right frontal  lobe, MCA territory.    MRI BRAIN HEAD WO CONTRAST - Final result (01/23/2014 9:38 AM EDT)  Narrative  HISTORY: Facial numbness and left arm tingling. Dysarthria.  EXAM: MRI of the brain without contrast  COMPARISON: CT brain January 22, 2014  TECHNIQUE: Multiplanar multisequence MRI images of the brain without contrast  FINDINGS: There is a small focus or two contiguous foci of restricted diffusion in the right frontal lobe, with FLAIR and ADC correlates. This is anterior to the precentral gyrus. No mass effect or hemorrhage seen. No additional areas of restricted  diffusion. The left hemisphere and brainstem are unremarkable. Cerebellar hemispheres also are unremarkable. Orbits and sinonasal structures appear normal. Ventricles are normal caliber. Extracranial soft tissues  are normal in appearance.   CT Angio Head Neck - Final result (01/22/2014 4:26 PM EDT)  CT Angio Head Neck - Final result (01/22/2014 4:26 PM EDT)  Impressions  IMPRESSION: No significant arterial disease    FINDINGS:  CTA neck:  # Aortic arch: No aneurysm. No significant stenosis or occlusion of the major arch vessels. Three-vessel arch  # Right carotid system: The vessels are well visualized with no evidence of significant plaque, or calcification. No aneurysm, significant stenosis or occlusion.  # Left carotid system: The vessels are well visualized with no evidence of significant plaque, or calcification. No aneurysm, significant stenosis or occlusion.  # Vertebrobasilar system: The vertebral arteries arise from their respective subclavian arteries. No aneurysm, significant stenosis or occlusion. Right vertebral dominant.  CTA head:  # Anterior circulation: No aneurysm, significant stenosis or occlusion.  # Posterior circulation: No aneurysm, significant stenosis or occlusion.  # Anatomic variants: None of significance.   ASSESSMENT AND PLAN: This is a 43 year old woman with recent TIA and apparent intracardiac right to left shunting associated with an atrial septal aneurysm. I have reviewed records from [facility] via "care everywhere." She had extensive testing done and this is outlined above. Imaging studies confirm a right MCA territory event with multiple foci which could be consistent with a cardioembolic event. She has no other evidence of arterial disease based on her CTA imaging. She has no other risk factors for stroke with a negative hypercoagulable panel, normal cholesterol, and no history of hypertension or diabetes. Based on findings of a positive agitated saline study, I have recommended that she undergo a transesophageal echo to better evaluate her interatrial septal anatomy and determine whether she might have a patent foramen ovale or a small atrial septal defect. She  is interested in pursuing transcatheter closure and we had a lengthy discussion about the association between PFO and cryptogenic stroke as well as randomized controlled trial data. She will return for followup after her TEE is completed and we will have further discussion at that time. In the meantime, have recommended that she remain on antiplatelet therapy with aspirin.  [name] 02/04/2014 8:44 AM

## 2014-02-07 NOTE — Telephone Encounter (Signed)
Would you mind checking back with medical records please?

## 2014-02-07 NOTE — Telephone Encounter (Signed)
Received records from [facility]. Forwarding on to [employer]

## 2014-02-07 NOTE — Telephone Encounter (Signed)
Please check medical records again for [facility]

## 2014-02-07 NOTE — Telephone Encounter (Signed)
Records are not in chart. I faxed request to get records sent again from [facility].

## 2014-02-09 ENCOUNTER — Encounter (HOSPITAL_COMMUNITY): Payer: Self-pay

## 2014-02-10 ENCOUNTER — Encounter (HOSPITAL_COMMUNITY)
Admission: RE | Disposition: A | Discharge status: Home / Self Care | Payer: Self-pay | Source: Ambulatory Visit | Attending: Internal Medicine

## 2014-02-10 ENCOUNTER — Encounter (HOSPITAL_COMMUNITY): Payer: Self-pay | Admitting: Internal Medicine

## 2014-02-10 ENCOUNTER — Ambulatory Visit (HOSPITAL_COMMUNITY)
Admission: RE | Admit: 2014-02-10 | Discharge: 2014-02-10 | Disposition: A | Discharge status: Home / Self Care | Payer: 59 | Source: Ambulatory Visit | Attending: Internal Medicine | Admitting: Internal Medicine

## 2014-02-10 DIAGNOSIS — Q2111 Secundum atrial septal defect: Secondary | ICD-10-CM | POA: Insufficient documentation

## 2014-02-10 DIAGNOSIS — I639 Cerebral infarction, unspecified: Secondary | ICD-10-CM

## 2014-02-10 DIAGNOSIS — I079 Rheumatic tricuspid valve disease, unspecified: Secondary | ICD-10-CM | POA: Insufficient documentation

## 2014-02-10 DIAGNOSIS — Q2112 Patent foramen ovale: Secondary | ICD-10-CM

## 2014-02-10 DIAGNOSIS — Q211 Atrial septal defect: Secondary | ICD-10-CM

## 2014-02-10 HISTORY — PX: TEE WITHOUT CARDIOVERSION: SHX5443

## 2014-02-10 SURGERY — ECHOCARDIOGRAM, TRANSESOPHAGEAL
Anesthesia: Moderate Sedation

## 2014-02-10 MED ORDER — BUTAMBEN-TETRACAINE-BENZOCAINE 2-2-14 % EX AERO
INHALATION_SPRAY | CUTANEOUS | Status: DC | PRN
  Administered 2014-02-10: 2 via TOPICAL

## 2014-02-10 MED ORDER — FENTANYL CITRATE 0.05 MG/ML IJ SOLN
INTRAMUSCULAR | Status: DC | PRN
  Administered 2014-02-10 (×2): 25 ug via INTRAVENOUS

## 2014-02-10 MED ORDER — LIDOCAINE VISCOUS 2 % MT SOLN
OROMUCOSAL | Status: AC
  Filled 2014-02-10: qty 15

## 2014-02-10 MED ORDER — MIDAZOLAM HCL 5 MG/ML IJ SOLN
INTRAMUSCULAR | Status: AC
  Filled 2014-02-10: qty 2

## 2014-02-10 MED ORDER — FENTANYL CITRATE 0.05 MG/ML IJ SOLN
INTRAMUSCULAR | Status: AC
  Filled 2014-02-10: qty 2

## 2014-02-10 MED ORDER — SODIUM CHLORIDE 0.9 % IV SOLN
INTRAVENOUS | Status: DC
  Administered 2014-02-10: 500 mL via INTRAVENOUS

## 2014-02-10 MED ORDER — MIDAZOLAM HCL 10 MG/2ML IJ SOLN
INTRAMUSCULAR | Status: DC | PRN
  Administered 2014-02-10: 1 mg via INTRAVENOUS
  Administered 2014-02-10: 2 mg via INTRAVENOUS

## 2014-02-10 NOTE — CV Procedure (Signed)
TRANSESOPHAGEAL ECHOCARDIOGRAM (TEE) NOTE  INDICATIONS: PFO, ?TIA  PROCEDURE:   Informed consent was obtained prior to the procedure. The risks, benefits and alternatives for the procedure were discussed and the patient comprehended these risks.  Risks include, but are not limited to, cough, sore throat, vomiting, nausea, somnolence, esophageal and stomach trauma or perforation, bleeding, low blood pressure, aspiration, pneumonia, infection, trauma to the teeth and death.    After a procedural time-out, the patient was given 3 mg versed and 50 mcg fentanyl for moderate sedation.  The oropharynx was anesthetized with 2 topical cetacaine sprays.  The transesophageal probe was inserted in the esophagus and stomach without difficulty and multiple views were obtained.  The patient was kept under observation until the patient left the procedure room.  The patient left the procedure room in stable condition.   Agitated microbubble saline contrast was administered.  COMPLICATIONS:    There were no immediate complications.  Findings:  1. LEFT VENTRICLE: The left ventricular wall thickness is normal.  The left ventricular cavity is normal in size. Wall motion is normal.  LVEF is 55-60%.  2. RIGHT VENTRICLE:  The right ventricle is normal in structure and function without any thrombus or masses.    3. LEFT ATRIUM:  The left atrium is normal in size without any thrombus or masses.  There is not spontaneous echo contrast ("smoke") in the left atrium consistent with a low flow state.  4. LEFT ATRIAL APPENDAGE:  The left atrial appendage is free of any thrombus or masses. The appendage has single lobes. Pulse doppler indicates high flow in the appendage.  5. ATRIAL SEPTUM:  The atrial septum is mobile, but not aneurysmal by definition. Saline contrast demonstrates a very small PFO.  There is evidence for a small amount of interatrial shunting by saline microbubble with cough and provocation, but no  significant left to right or right to left flow by color doppler. No evidence for atrial septal defects at either end of the atrial septum. This was visualized in 2D and 3D, demonstrating flap motion.  6. RIGHT ATRIUM:  The right atrium is normal in size and function without any thrombus or masses.  7. MITRAL VALVE:  The mitral valve is normal in structure and function with trivial regurgitation.  There were no vegetations or stenosis.  8. AORTIC VALVE:  The aortic valve is trileaflet and normal in structure and function with no regurgitation.  There were no vegetations or stenosis  9. TRICUSPID VALVE:  The tricuspid valve is normal in structure and function with trivial regurgitation.  There were no vegetations or stenosis  10.  PULMONIC VALVE:  The pulmonic valve is normal in structure and function with no regurgitation.  There were no vegetations or stenosis.   11. AORTIC ARCH, ASCENDING AND DESCENDING AORTA:  There was no Ron Parker et. Al, 1992) atherosclerosis of the ascending aorta, aortic arch, or proximal descending aorta.  12. PULMONARY VEINS: Anomalous pulmonary venous return was not noted.  13. PERICARDIUM: The pericardium appeared normal and non-thickened.  There is no pericardial effusion.  IMPRESSION:   1.  Small PFO with very few microbubbles crossing from right to left with provocation.  RECOMMENDATIONS:    1. Given the small size and intermittent nature of the PFO, I would not recommend closure. 2. There is questions as to whether her symptoms may have been complicated migraine versus TIA.  Time Spent Directly with the Patient:  45 minutes   [name], MD,  [facility] Attending Cardiologist [facility]  02/10/2014, 9:52 AM

## 2014-02-10 NOTE — H&P (Signed)
    INTERVAL PROCEDURE H&P  History and Physical Interval Note:  02/10/2014 7:51 AM  Melinda Kirk has presented today for their planned procedure. The various methods of treatment have been discussed with the patient and family. After consideration of risks, benefits and other options for treatment, the patient has consented to the procedure.  The patients' outpatient history has been reviewed, patient examined, and no change in status from most recent office note within the past 30 days. I have reviewed the patients' chart and labs and will proceed as planned. Questions were answered to the patient's satisfaction.   [name], MD, [facility] Attending Cardiologist [facility]  HILTY,Kenneth C 02/10/2014, 7:51 AM

## 2014-02-10 NOTE — Progress Notes (Signed)
Echocardiogram Transesophageal has been performed.  Philipp Deputy 02/10/2014, 12:16 PM

## 2014-02-10 NOTE — Discharge Instructions (Signed)
Patent Foramen Ovale Before birth, babies receive their oxygen from the mother's placenta. This blood travels to the baby's heart and passes through an extra opening in the wall between the two top chambers of the heart (right and left atria). This opening is called the foramen ovale. It allows oxygen-rich blood to bypass the baby's lungs and flow directly to the baby's body. After birth, when the baby can supply its own oxygen by breathing, the foramen ovale should close within a few days. In up to 25% of babies, the foramen ovale remains open (patent). When the foramen ovale remains open it allows a small amount of blood to leak from one atrium to another.  Most babies and children do not experience any symptoms from a patent foramen ovale (PFO). However, because the blood from the two chambers may mix, there is an increased risk of complications. There is a chance of breathing problems in adults, but more importantly, there is a significant risk of stroke (blood clot in the brain). The risk is greater in younger patients, sometimes even teenagers. A PFO can easily be diagnosed with an echocardiogram (ultrasound of the heart). If your doctor is concerned that your child is having symptoms or problems from a PFO, they may recommend that the PFO be closed. This procedure is called a cardiac catheterization. LET YOUR CHILD'S CAREGIVER KNOW ABOUT:   Allergies to food or medications.  Medications taken including herbs, eyedrops, over the counter medications, and creams.  Possible pregnancy.  Previous problems with anesthetics or novocaine.  History of bleeding or blood problems.  Other health problems. PROCEDURE Cardiac catheterization is performed via a large vein, either at the base of the neck, or, more commonly, in the leg. A catheter (a narrow flexible tube) is advanced from the vein up to the heart, where the cardiologist can check blood pressures in the heart and lungs, as well as perform an  angiogram (X-ray movie) of the heart. This is often accompanied by a transesophageal echocardiogram to confirm presence of a PFO and to guide the cardiologist. Once a PFO is found, the cardiologist will close it with a special device. This device is made of flexible metal and can fit inside of the catheter. The device has two umbrellas (one sits on the left side of the wall and the other on the right). These umbrellas attach in the middle and close around the hole. The cardiologist will use the echocardiogram to confirm that the device is in a good position before releasing the device. They will then remove the catheter, leaving the device in place. After the procedure is done, many cardiologists will admit the patient overnight to monitor for any possible complication. These complications include pain, bleeding, bruising, allergic reaction, infection, heart rhythm disturbances, and/or blood clots on the device or in the vein. These complications are rare. Most patients are discharged within 1 to 2 days of the procedure without any complications at all. HOME CARE INSTRUCTIONS   Only give over-the-counter or prescription medicines for pain, discomfort, or fever as directed by your child's caregiver.  Allow baths or showers as instructed by your child's caregiver.  For a week after the procedure, your child should refrain from hard physical exercise. SEEK MEDICAL CARE IF:   A large bruise develops at the site where the needle was placed.  Redness, tenderness or rash develop around the site where the needle was placed. SEEK IMMEDIATE MEDICAL CARE IF:   Bleeding from the site develops and will not  stop even after pressure is applied.  There is numbness or loss of sensation that develops in the leg and/or foot on the side where the needle was placed.  Red streaking develops above and/or below the site.  The leg becomes cold, blue, numb, or tingles.  Your child has an oral temperature above 102 F  (38.9 C), not controlled by medicine.  There is intense pain in the leg where the needle had been placed.  There is chest pain, palpitations (irregular heartbeats), or shortness of breath.  There is a fainting spell or near fainting.  There is yellow or pus-like drainage from the site. Document Released: 11/18/2006 Document Revised: 11/09/2012 Document Reviewed: 09/01/2008 [facility] Patient Information 2015 [place], [place]. This information is not intended to replace advice given to you by your health care provider. Make sure you discuss any questions you have with your health care provider.   Transesophageal Echocardiogram Transesophageal echocardiography (TEE) is a special type of test that produces images of the heart by using sound waves (echocardiogram). This type of echocardiography can obtain better images of the heart than standard echocardiography. TEE is done by passing a flexible tube down the esophagus. The heart is located in front of the esophagus. Because the heart and esophagus are close to one another, your health care provider can take very clear, detailed pictures of the heart via ultrasound waves. TEE may be done:  If your health care provider needs more information based on standard echocardiography findings.  If you had a stroke. This might have happened because a clot formed in your heart. TEE can visualize different areas of the heart and check for clots.  To check valve anatomy and function.  To check for infection on the inside of your heart (endocarditis).  To evaluate the dividing wall (septum) of the heart and presence of a hole that did not close after birth (patent foramen ovale or atrial septal defect).  To help diagnose a tear in the wall of the aorta (aortic dissection).  During cardiac valve surgery. This allows the surgeon to assess the valve repair before closing the chest.  During a variety of other cardiac procedures to guide positioning of  catheters.  Sometimes before a cardioversion, which is a shock to convert heart rhythm back to normal. LET [facility] CARE PROVIDER KNOW ABOUT:   Any allergies you have.  All medicines you are taking, including vitamins, herbs, eye drops, creams, and over-the-counter medicines.  Previous problems you or members of your family have had with the use of anesthetics.  Any blood disorders you have.  Previous surgeries you have had.  Medical conditions you have.  Swallowing difficulties.  An esophageal obstruction. RISKS AND COMPLICATIONS  Generally, TEE is a safe procedure. However, as with any procedure, complications can occur. Possible complications include an esophageal tear (rupture). BEFORE THE PROCEDURE   Do not eat or drink for 6 hours before the procedure or as directed by your health care provider.  Arrange for someone to drive you home after the procedure. Do not drive yourself home. During the procedure, you will be given medicines that can continue to make you feel drowsy and can impair your reflexes.  An IV access tube will be started in the arm. PROCEDURE   A medicine to help you relax (sedative) will be given through the IV access tube.  A medicine may be sprayed or gargled to numb the back of the throat.  Your blood pressure, heart rate, and breathing (vital  signs) will be monitored during the procedure.  The TEE probe is a long, flexible tube. The tip of the probe is placed into the back of the mouth, and you will be asked to swallow. This helps to pass the tip of the probe into the esophagus. Once the tip of the probe is in the correct area, your health care provider can take pictures of the heart.  TEE is usually not a painful procedure. You may feel the probe press against the back of the throat. The probe does not enter the trachea and does not affect your breathing. AFTER THE PROCEDURE   You will be in bed, resting, until you have fully returned to  consciousness.  When you first awaken, your throat may feel slightly sore and will probably still feel numb. This will improve slowly over time.  You will not be allowed to eat or drink until it is clear that the numbness has improved.  Once you have been able to drink, urinate, and sit on the edge of the bed without feeling sick to your stomach (nausea) or dizzy, you may be cleared to go home.  You should have a friend or family member with you for the next 24 hours after your procedure. Document Released: 10/05/2002 Document Revised: 07/20/2013 Document Reviewed: 01/14/2013 [facility] Patient Information 2015 [place], [place]. This information is not intended to replace advice given to you by your health care provider. Make sure you discuss any questions you have with your health care provider.

## 2014-02-11 ENCOUNTER — Encounter: Payer: 59 | Admitting: Family Medicine

## 2014-02-11 ENCOUNTER — Ambulatory Visit (INDEPENDENT_AMBULATORY_CARE_PROVIDER_SITE_OTHER): Payer: 59 | Admitting: Neurology

## 2014-02-11 ENCOUNTER — Encounter (HOSPITAL_COMMUNITY): Payer: Self-pay | Admitting: Internal Medicine

## 2014-02-11 VITALS — BP 105/60 | HR 67 | Ht 63.0 in | Wt 119.4 lb

## 2014-02-11 DIAGNOSIS — M79605 Pain in left leg: Secondary | ICD-10-CM

## 2014-02-11 DIAGNOSIS — M79604 Pain in right leg: Secondary | ICD-10-CM

## 2014-02-11 DIAGNOSIS — IMO0001 Reserved for inherently not codable concepts without codable children: Secondary | ICD-10-CM

## 2014-02-11 DIAGNOSIS — I639 Cerebral infarction, unspecified: Secondary | ICD-10-CM

## 2014-02-11 DIAGNOSIS — M79609 Pain in unspecified limb: Secondary | ICD-10-CM

## 2014-02-11 DIAGNOSIS — M791 Myalgia, unspecified site: Secondary | ICD-10-CM

## 2014-02-11 DIAGNOSIS — I6350 Cerebral infarction due to unspecified occlusion or stenosis of unspecified cerebral artery: Secondary | ICD-10-CM

## 2014-02-11 LAB — RHEUMATOID FACTOR: Rhuematoid fact SerPl-aCnc: <10 IU/mL (ref <=14)

## 2014-02-11 NOTE — Progress Notes (Signed)
NEUROLOGY CONSULTATION NOTE  Melinda Kirk MRN: [id number] DOB: 12-16-1970  Referring provider: Debbrah Alar, NP  Primary care provider: Dr. [name]  Reason for consult:  Recent hospitalization for left-sided symptoms  Thank you for your kind referral of Melinda Kirk for consultation of the above symptoms. Although her history is well known to you, please allow me to reiterate it for the purpose of our medical record. The patient was accompanied to the clinic by her husband who also provides collateral information. Records and images were personally reviewed where available.  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  She started feeling numbness in her tongue and throat and felt that she could not talk. Witnesses said she talked like she was drunk. She recalls touching her throat with her right hand and hearing people around her, but it seemed like she could not see peripherally on both sides, "like tunnel vision" seeing only what was in front of her.  She had other nurses with her, and she was noted to have difficulty raising her left arm.  She was told she looked pale, and was instructed to take an aspirin, and recalls coughing while trying to take it. She denied any headache at that time. They tell me that EMS still noted the left-sided weakness. She was taken to [facility] [address], records from her hospitalization were reviewed.  It appears that symptoms resolved after 1-2 hours, NIHSS was 0 on Neurology evaluation.  She underwent stroke workup, per MRI brain report, there is a small focus or two contiguous foci of restricted diffusion in the right frontal lobe, with FLAIR and ADC correlates. This is anterior to the  precentral gyrus. Per Neurology note, "there is no convincing evidence of a right frontal stroke" because there was no corresponding ADC signal on his review, doubt small stroke, may be due to migraine with subtle increased diffusion signal.  Images unavailable for review at this time.  Her EKG showed sinus rhythm with sinus arrhythmia.  TTE showed a PFO.  Her hypercoagulable workup with anticardiolipin and antiphospholipid antibodies, prothrombin, factor V Leiden, protein S, C, lupus anticoagulant, antithrombin III, homocysteine were negative.  B12, rpr, tsh, folate, hba1c 5.5, lipid panel were normal.  Urine drug screen normal.  She was discharged home on aspirin 81mg  daily.    She has been evaluated by Cardiology after hospital discharge, TEE done showed atrial septum is mobile but not aneurysmal by definition. There is a small PFO with very few microbubbles crossing from right to left with provocation. Closure was not recommended due to small size and intermittent nature of PFO.  She had refused lower extremity dopplers in the hospital because she has had chronic leg pain for for at least 7 years and has had 2 normal venous dopplers in the past.  She reports pain starts in her feet, radiating behind her legs up to her back.  She feels uncomfortable bending her knees with pain behind the knees.  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  She has a history of migraines for many years, usually triggered by heat.  Headaches are over the frontal region, throbbing, with associated photophobia and nausea where she  needs to lie down. She starts feeling flushed then dizzy, Aleve usually helps. She has visual auras of floaters. She has had 3 migraines this summer, but denies having a headache during the episode above.  She went back to work recently and started having headaches and leg pain after lifting a heavy patient.  Her BP was noted to be elevated at 148/80 (she usually runs SBP 90s).  She  has noticed sensitivity and pain in both arms when BP cuff is inflated.  She continues to have intermittent episodes of left hand tingling.  She has occasional dizziness with movement.  She denies any diplopia, dysarthria, bowel/bladder dysfunction.  PAST MEDICAL HISTORY: Past Medical History  Diagnosis Date  . GERD (gastroesophageal reflux disease)   . Breast calcification, left 07/29/04  . Thyroid nodule     s/p FNA- 4/09- benign  . Vitamin D deficiency 03/08/2011  . Headache(784.0) 01/03/2013  . Other and unspecified hyperlipidemia 02/08/2013  . Unspecified hypothyroidism 01/15/2011  . Preventative health care 01/22/2011  . Menorrhagia 03/07/2013    PAST SURGICAL HISTORY: Past Surgical History  Procedure Laterality Date  . Biopsy thyroid  10/2007    benign biopsy of thyroid nodules.  Darden Dates without cardioversion N/A 02/10/2014    Procedure: TRANSESOPHAGEAL ECHOCARDIOGRAM (TEE);  Surgeon: [name], MD;  Location: [facility] ENDOSCOPY;  Service: Cardiovascular;  Laterality: N/A;    MEDICATIONS: Current Outpatient Prescriptions on File Prior to Visit  Medication Sig Dispense Refill  . aspirin EC 81 MG tablet Take 81 mg by mouth daily.      . B Complex-C (SUPER B COMPLEX) TABS Take 1 tablet by mouth daily.      . Calcium Carbonate-Vitamin D (CALTRATE 600+D PO) Take 1 tablet by mouth daily.      . Cholecalciferol (VITAMIN D-3) 5000 UNITS TABS Take 5,000 Units by mouth daily.      . ferrous sulfate 325 (65 FE) MG tablet Take 325 mg by mouth daily with breakfast.      . levonorgestrel (MIRENA) 20 MCG/24HR IUD 1 each by Intrauterine route once.        . Multiple Vitamins-Minerals (MULTIVITAMIN WITH MINERALS) tablet Take 1 tablet by mouth daily.      . naproxen sodium (ALEVE) 220 MG tablet Take 440 mg by mouth daily as needed (headache).      . Omega-3 Fatty Acids (EQL OMEGA 3 FISH OIL) 1400 MG CAPS Take 1,400 mg by mouth daily.       [name] omeprazole-sodium bicarbonate (ZEGERID) 40-1100 MG per  capsule Take 1 capsule by mouth daily before breakfast.  30 capsule  2  . traMADol (ULTRAM) 50 MG tablet Take 1 tablet (50 mg total) by mouth every 8 (eight) hours as needed for pain.  30 tablet  1  . vitamin C (ASCORBIC ACID) 500 MG tablet Take 500 mg by mouth daily.      . vitamin E 400 UNIT capsule Take 400 Units by mouth daily.       No current facility-administered medications on file prior to visit.    ALLERGIES: No Known Allergies  FAMILY HISTORY: Family History  Problem Relation Age of Onset  . Stroke Mother   . Arthritis Mother   . Heart disease Father     CHF?    SOCIAL HISTORY: History   Social History  . Marital Status: Married    Spouse Name: N/A    Number of Children: N/A  . Years of Education: N/A   Occupational History  .  Not on file.   Social History Main Topics  . Smoking status: Former Smoker -- 5 years  . Smokeless tobacco: Never Used  . Alcohol Use: Yes     Comment: occasional  . Drug Use: No  . Sexual Activity: Not on file   Other Topics Concern  . Not on file   Social History Narrative   Regular exercise:  No   Caffeine: 1-2 cups coffee / tea daily.   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     REVIEW OF SYSTEMS: Constitutional: No fevers, chills, or sweats, no generalized fatigue, change in appetite Eyes: No visual changes, double vision, eye pain Ear, nose and throat: No hearing loss, ear pain, nasal congestion, sore throat Cardiovascular: No chest pain, palpitations Respiratory:  No shortness of breath at rest or with exertion, wheezes GastrointestinaI: No nausea, vomiting, diarrhea, abdominal pain, fecal incontinence Genitourinary:  No dysuria, urinary retention or frequency Musculoskeletal:  No neck pain, + back pain Integumentary: No rash, pruritus, skin lesions Neurological: as above Psychiatric: No depression, insomnia, anxiety Endocrine: No palpitations, fatigue,  diaphoresis, mood swings, change in appetite, change in weight, increased thirst Hematologic/Lymphatic:  No anemia, purpura, petechiae. Allergic/Immunologic: no itchy/runny eyes, nasal congestion, recent allergic reactions, rashes  PHYSICAL EXAM: Filed Vitals:   02/11/14 0912  BP: 105/60  Pulse: 67   General: No acute distress Head:  Normocephalic/atraumatic Eyes: Fundoscopic exam shows bilateral sharp discs, no vessel changes, exudates, or hemorrhages Neck: supple, no paraspinal tenderness, full range of motion Back: No paraspinal tenderness Heart: regular rate and rhythm Lungs: Clear to auscultation bilaterally. Vascular: No carotid bruits. Skin/Extremities: No rash, no edema Neurological Exam: Mental status: alert and oriented to person, place, and time, no dysarthria or aphasia, Fund of knowledge is appropriate.  Recent and remote memory are intact.  Attention and concentration are normal.    Able to name objects and repeat phrases. Cranial nerves: CN I: not tested CN II: pupils equal, round and reactive to light, visual fields intact, fundi unremarkable. CN III, IV, VI:  full range of motion, no nystagmus, no ptosis CN V: facial sensation intact CN VII: upper and lower face symmetric CN VIII: hearing intact to finger rub CN IX, X: gag intact, uvula midline CN XI: sternocleidomastoid and trapezius muscles intact CN XII: tongue midline Bulk & Tone: normal, no fasciculations. Motor: 5/5 throughout with no pronator drift. Sensation: intact to light touch, cold, pin, vibration and joint position sense.  No extinction to double simultaneous stimulation.  Romberg test negative Deep Tendon Reflexes: +2 throughout, no ankle clonus Plantar responses: downgoing bilaterally Cerebellar: no incoordination on finger to nose, heel to shin. No dysdiadochokinesia Gait: narrow-based and steady, able to tandem walk adequately. Tremor: none  IMPRESSION: This is a 43 year old right-handed  woman with a history of migraines, who had a transient episode of left sided numbness and mild weakness with numbness in her tongue and throat last 01/22/14.  Per records, MRI brain had shown diffusion signal abnormality in the right frontal region, which would correlate with her symptoms, however per neurology report, there was no ADC correlate to convincingly determine this was an acute infarct and symptoms were felt to be possibly related to complicated migraine.  She had a PFO on echo, TEE showed this to be small and intermittent, with no atrial septal aneurysm.  We discussed her symptoms, disc of MRI requested for review.  We  discussed current recommendations for stroke and PFO, antiplatelet treatment rather than PFO closure, except if there is evidence of DVT.  She had refused venous dopplers due to having them done twice due to chronic leg pain in the past.  We discussed her leg symptoms and back and joint pain, MRI lumbar spine will be ordered, as well as bloodwork for Laquana, RF.  We discussed control of vascular risk factors for secondary stroke prevention, she will continue to monitor BP.  She will follow-up in 1 month and knows to go to the ER immediately if symptoms recur.  Thank you for allowing me to participate in the care of this patient. Please do not hesitate to call for any questions or concerns.   Ellouise Newer, M.D.

## 2014-02-11 NOTE — Patient Instructions (Signed)
1. MRI lumbar spine without contrast 2. Bloodwork for Layal, SS-A, SS-B, RF 3. We will obtain disc of studies done at [facility] 4. Continue daily aspirin 5. Continue to monitor BP

## 2014-02-12 ENCOUNTER — Encounter: Payer: Self-pay | Admitting: Neurology

## 2014-02-14 ENCOUNTER — Encounter: Payer: Self-pay | Admitting: Neurology

## 2014-02-14 LAB — ANA: Anti Nuclear Antibody(ANA): NEGATIVE

## 2014-02-14 LAB — SJOGREN'S SYNDROME ANTIBODS(SSA + SSB)
SSA (RO) (ENA) ANTIBODY, IGG: NEGATIVE
SSB (La) (ENA) Antibody, IgG: <1

## 2014-02-15 NOTE — Telephone Encounter (Signed)
Pt returning call. Please call @ [phone] / Gayleen Orem.

## 2014-02-16 ENCOUNTER — Encounter: Payer: Self-pay | Admitting: Neurology

## 2014-02-18 ENCOUNTER — Encounter: Payer: Self-pay | Admitting: Family Medicine

## 2014-02-18 ENCOUNTER — Telehealth: Payer: Self-pay | Admitting: Cardiovascular Disease

## 2014-02-18 ENCOUNTER — Ambulatory Visit (INDEPENDENT_AMBULATORY_CARE_PROVIDER_SITE_OTHER): Payer: 59 | Admitting: Family Medicine

## 2014-02-18 VITALS — BP 110/64 | HR 66 | Temp 98.5°F | Ht 63.5 in | Wt 121.0 lb

## 2014-02-18 DIAGNOSIS — Z309 Encounter for contraceptive management, unspecified: Secondary | ICD-10-CM

## 2014-02-18 DIAGNOSIS — I639 Cerebral infarction, unspecified: Secondary | ICD-10-CM

## 2014-02-18 DIAGNOSIS — Z0000 Encounter for general adult medical examination without abnormal findings: Secondary | ICD-10-CM

## 2014-02-18 DIAGNOSIS — I6350 Cerebral infarction due to unspecified occlusion or stenosis of unspecified cerebral artery: Secondary | ICD-10-CM

## 2014-02-18 DIAGNOSIS — Q211 Atrial septal defect: Secondary | ICD-10-CM

## 2014-02-18 DIAGNOSIS — E041 Nontoxic single thyroid nodule: Secondary | ICD-10-CM

## 2014-02-18 DIAGNOSIS — Q2111 Secundum atrial septal defect: Secondary | ICD-10-CM

## 2014-02-18 DIAGNOSIS — K219 Gastro-esophageal reflux disease without esophagitis: Secondary | ICD-10-CM

## 2014-02-18 DIAGNOSIS — G43109 Migraine with aura, not intractable, without status migrainosus: Secondary | ICD-10-CM

## 2014-02-18 DIAGNOSIS — Q2112 Patent foramen ovale: Secondary | ICD-10-CM

## 2014-02-18 NOTE — Assessment & Plan Note (Signed)
Small, noted via Echo in 2015 after a neurologic episode. Too small to benefit from surgical correction at this time

## 2014-02-18 NOTE — Progress Notes (Signed)
Patient ID: Melinda Kirk, female   DOB: 20-Sep-1970, 43 y.o.   MRN: [id number] TINEY ZIPPER [id number] 05/15/1971 02/18/2014      Progress Note-Follow Up  Subjective  Chief Complaint  Chief Complaint  Patient presents with  . Annual Exam    physical    HPI  Patient is a 43 year old female in today for routine medical care. She is in today for her annual exam but unfortunately has had a great deal of excitement lately. She had a neurologic episode on June 28 of May. She was talking in her usual state of health and without headache when she had numbness in her left arm which progressed up the arm into the neck and ultimately into the tongue. She difficulty with word finding and was pale. 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 1-2 hours after taking aspirin her symptoms resolved. She is a chronic migraine or and generally gets relief from naproxen. These have not been worse since her episode. She complains of feeling lightheaded frequently but this is ongoing. Have reviewed and neurology, cardiology in hospital meds with patient. She complains of occasional twitching in her left cheek and some mild pain in her right arm off and on since her episode. She has a long history of leg pain and that has recently been notable as well. She is actually scheduled for a low back MRI due to low back and leg pain which was ordered by neurology. Denies CP/palp/SOB/HA/congestion/fevers/GI or GU c/o. Taking meds as prescribed. Did undergo TEE which identified a PFO too small to warrant surgical correction.    Past Medical History  Diagnosis Date  . GERD (gastroesophageal reflux disease)   . Breast calcification, left 07/29/04  . Thyroid nodule     s/p FNA- 4/09- benign  . Vitamin D deficiency 03/08/2011  . Headache(784.0) 01/03/2013  . Other and unspecified hyperlipidemia 02/08/2013  . Unspecified hypothyroidism 01/15/2011  . Preventative health care 01/22/2011  . Menorrhagia  03/07/2013    Past Surgical History  Procedure Laterality Date  . Biopsy thyroid  10/2007    benign biopsy of thyroid nodules.  Darden Dates without cardioversion N/A 02/10/2014    Procedure: TRANSESOPHAGEAL ECHOCARDIOGRAM (TEE);  Surgeon: [name], MD;  Location: [facility] ENDOSCOPY;  Service: Cardiovascular;  Laterality: N/A;    Family History  Problem Relation Age of Onset  . Stroke Mother   . Arthritis Mother   . Heart disease Father     CHF?    History   Social History  . Marital Status: Married    Spouse Name: N/A    Number of Children: N/A  . Years of Education: N/A   Occupational History  . Not on file.   Social History Main Topics  . Smoking status: Former Smoker -- 5 years  . Smokeless tobacco: Never Used  . Alcohol Use: Yes     Comment: occasional  . Drug Use: No  . Sexual Activity: Not on file   Other Topics Concern  . Not on file   Social History Narrative   Regular exercise:  No   Caffeine: 1-2 cups coffee / tea daily.   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     Current Outpatient Prescriptions on File Prior to Visit  Medication Sig Dispense Refill  . aspirin EC 81 MG tablet Take 81 mg by  mouth daily.      . B Complex-C (SUPER B COMPLEX) TABS Take 1 tablet by mouth daily.      . Calcium Carbonate-Vitamin D (CALTRATE 600+D PO) Take 1 tablet by mouth daily.      . Cholecalciferol (VITAMIN D-3) 5000 UNITS TABS Take 5,000 Units by mouth daily.      . ferrous sulfate 325 (65 FE) MG tablet Take 325 mg by mouth daily with breakfast.      . levonorgestrel (MIRENA) 20 MCG/24HR IUD 1 each by Intrauterine route once.        . Multiple Vitamins-Minerals (MULTIVITAMIN WITH MINERALS) tablet Take 1 tablet by mouth daily.      . naproxen sodium (ALEVE) 220 MG tablet Take 440 mg by mouth daily as needed (headache).      . Omega-3 Fatty Acids (EQL OMEGA 3 FISH OIL) 1400 MG CAPS Take 1,400 mg by mouth daily.       [name]  omeprazole-sodium bicarbonate (ZEGERID) 40-1100 MG per capsule Take 1 capsule by mouth daily before breakfast.  30 capsule  2  . traMADol (ULTRAM) 50 MG tablet Take 1 tablet (50 mg total) by mouth every 8 (eight) hours as needed for pain.  30 tablet  1  . vitamin C (ASCORBIC ACID) 500 MG tablet Take 500 mg by mouth daily.      . vitamin E 400 UNIT capsule Take 400 Units by mouth daily.       No current facility-administered medications on file prior to visit.    No Known Allergies  Review of Systems  Review of Systems  Constitutional: Positive for malaise/fatigue. Negative for fever.  HENT: Negative for congestion.   Eyes: Negative for discharge.  Respiratory: Negative for shortness of breath.   Cardiovascular: Negative for chest pain, palpitations and leg swelling.  Gastrointestinal: Negative for nausea, abdominal pain and diarrhea.  Genitourinary: Negative for dysuria.  Musculoskeletal: Negative for falls.  Skin: Negative for rash.  Neurological: Positive for headaches. Negative for loss of consciousness.  Endo/Heme/Allergies: Negative for polydipsia.  Psychiatric/Behavioral: Negative for depression and suicidal ideas. The patient is not nervous/anxious and does not have insomnia.     Objective  BP 110/64  Pulse 66  Temp(Src) 98.5 F (36.9 C) (Oral)  Ht 5' 3.5" (1.613 m)  Wt 121 lb 0.6 oz (54.903 kg)  BMI 21.10 kg/m2  SpO2 96%  LMP 01/02/2014  Physical Exam  Physical Exam  Constitutional: She is oriented to person, place, and time and well-developed, well-nourished, and in no distress. No distress.  HENT:  Head: Normocephalic and atraumatic.  Right Ear: External ear normal.  Left Ear: External ear normal.  Nose: Nose normal.  Mouth/Throat: Oropharynx is clear and moist. No oropharyngeal exudate.  Eyes: Conjunctivae are normal. Pupils are equal, round, and reactive to light. Right eye exhibits no discharge. Left eye exhibits no discharge. No scleral icterus.  Neck:  Normal range of motion. Neck supple. No thyromegaly present.  Cardiovascular: Normal rate, regular rhythm, normal heart sounds and intact distal pulses.   No murmur heard. Pulmonary/Chest: Effort normal and breath sounds normal. No respiratory distress. She has no wheezes. She has no rales.  Abdominal: Soft. Bowel sounds are normal. She exhibits no distension and no mass. There is no tenderness.  Musculoskeletal: Normal range of motion. She exhibits no edema and no tenderness.  Lymphadenopathy:    She has no cervical adenopathy.  Neurological: She is alert and oriented to person, place, and time. She has normal reflexes.  No cranial nerve deficit. Coordination normal.  Skin: Skin is warm and dry. No rash noted. She is not diaphoretic.  Psychiatric: Mood, memory and affect normal.    Lab Results  Component Value Date   TSH 1.196 02/05/2013   Lab Results  Component Value Date   WBC 6.6 02/04/2014   HGB 12.4 02/04/2014   HCT 37.2 02/04/2014   MCV 90.0 02/04/2014   PLT 215.0 02/04/2014   Lab Results  Component Value Date   CREATININE 0.5 02/04/2014   BUN 13 02/04/2014   NA 137 02/04/2014   K 4.0 02/04/2014   CL 105 02/04/2014   CO2 26 02/04/2014   Lab Results  Component Value Date   ALT 14 02/05/2013   AST 16 02/05/2013   ALKPHOS 48 02/05/2013   BILITOT 0.6 02/05/2013   Lab Results  Component Value Date   CHOL 209* 02/05/2013   Lab Results  Component Value Date   HDL 59 02/05/2013   Lab Results  Component Value Date   LDLCALC 130* 02/05/2013   Lab Results  Component Value Date   TRIG 98 02/05/2013   Lab Results  Component Value Date   CHOLHDL 3.5 02/05/2013     Assessment & Plan   GERD (gastroesophageal reflux disease) Avoid offending foods, start probiotics. Do not eat large meals in late evening and consider raising head of bed. Tolerating Zegerid   Patent foramen ovale with right to left shunt Small, noted via Echo in 2015 after a neurologic episode. 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 No recurrence of left sided symptoms or trouble with speech since initial episode except for some occasional twitching in left face and discomfort in left arm  Preventative health care Patient encouraged to maintain heart healthy diet, regular exercise, adequate sleep. Consider daily probiotics. Take medications as prescribed  Migraine headache with aura Long history with scotomata, photophobia bu was not having symptoms during her neurologic episode. Encouraged increased hydration, 64 ounces of clear fluids daily. Minimize alcohol and caffeine. Eat small frequent meals with lean proteins and complex carbs. Avoid high and low blood sugars. Get adequate sleep, 7-8 hours a night. Needs exercise daily preferably in the morning.

## 2014-02-18 NOTE — Progress Notes (Signed)
Pre visit review using our clinic review tool, if applicable. No additional management support is needed unless otherwise documented below in the visit note. 

## 2014-02-18 NOTE — Assessment & Plan Note (Signed)
Avoid offending foods, start probiotics. Do not eat large meals in late evening and consider raising head of bed. Tolerating Zegerid

## 2014-02-18 NOTE — Telephone Encounter (Signed)
New message    Returning call back to Dr. Burt Knack.     Patient aware that he's in clinic at this time.

## 2014-02-18 NOTE — Assessment & Plan Note (Signed)
TSH recently within normal limits at [facility] in June 2015

## 2014-02-18 NOTE — Assessment & Plan Note (Signed)
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 No recurrence of left sided symptoms or trouble with speech since initial episode except for some occasional twitching in left face and discomfort in left arm

## 2014-02-18 NOTE — Telephone Encounter (Signed)
Calling stating she was returning Dr. Antionette Char call.  Dr. Burt Knack states he will call her back after seeing pts in clinic.  She is aware and will wait for call.

## 2014-02-18 NOTE — Assessment & Plan Note (Signed)
Long history with scotomata, photophobia bu was not having symptoms during her neurologic episode. Encouraged increased hydration, 64 ounces of clear fluids daily. Minimize alcohol and caffeine. Eat small frequent meals with lean proteins and complex carbs. Avoid high and low blood sugars. Get adequate sleep, 7-8 hours a night. Needs exercise daily preferably in the morning.

## 2014-02-18 NOTE — Assessment & Plan Note (Addendum)
Patient encouraged to maintain heart healthy diet, regular exercise, adequate sleep. Consider daily probiotics. Take medications as prescribed. Referred back to her GYN for pap and change of her Mirena which she believes as been in place about 7 years. Consider Copper IUD.

## 2014-02-18 NOTE — Patient Instructions (Addendum)
Consider Jobst stockings, light weight 10-20 mm hg on in am off in pm  Probiotics daily such as Digestive Advantage or PHillip's Colon Health   Edema Edema is an abnormal buildup of fluids in your bodytissues. Edema is somewhatdependent on gravity to pull the fluid to the lowest place in your body. That makes the condition more common in the legs and thighs (lower extremities). Painless swelling of the feet and ankles is common and becomes more likely as you get older. It is also common in looser tissues, like around your eyes.  When the affected area is squeezed, the fluid may move out of that spot and leave a dent for a few moments. This dent is called pitting.  CAUSES  There are many possible causes of edema. Eating too much salt and being on your feet or sitting for a long time can cause edema in your legs and ankles. Hot weather may make edema worse. Common medical causes of edema include:  Heart failure.  Liver disease.  Kidney disease.  Weak blood vessels in your legs.  Cancer.  An injury.  Pregnancy.  Some medications.  Obesity. SYMPTOMS  Edema is usually painless.Your skin may look swollen or shiny.  DIAGNOSIS  Your health care provider may be able to diagnose edema by asking about your medical history and doing a physical exam. You may need to have tests such as X-rays, an electrocardiogram, or blood tests to check for medical conditions that may cause edema.  TREATMENT  Edema treatment depends on the cause. If you have heart, liver, or kidney disease, you need the treatment appropriate for these conditions. General treatment may include:  Elevation of the affected body part above the level of your heart.  Compression of the affected body part. Pressure from elastic bandages or support stockings squeezes the tissues and forces fluid back into the blood vessels. This keeps fluid from entering the tissues.  Restriction of fluid and salt intake.  Use of a water  pill (diuretic). These medications are appropriate only for some types of edema. They pull fluid out of your body and make you urinate more often. This gets rid of fluid and reduces swelling, but diuretics can have side effects. Only use diuretics as directed by your health care provider. HOME CARE INSTRUCTIONS   Keep the affected body part above the level of your heart when you are lying down.   Do not sit still or stand for prolonged periods.   Do not put anything directly under your knees when lying down.  Do not wear constricting clothing or garters on your upper legs.   Exercise your legs to work the fluid back into your blood vessels. This may help the swelling go down.   Wear elastic bandages or support stockings to reduce ankle swelling as directed by your health care provider.   Eat a low-salt diet to reduce fluid if your health care provider recommends it.   Only take medicines as directed by your health care provider. SEEK MEDICAL CARE IF:   Your edema is not responding to treatment.  You have heart, liver, or kidney disease and notice symptoms of edema.  You have edema in your legs that does not improve after elevating them.   You have sudden and unexplained weight gain. SEEK IMMEDIATE MEDICAL CARE IF:   You develop shortness of breath or chest pain.   You cannot breathe when you lie down.  You develop pain, redness, or warmth in the swollen areas.  You have heart, liver, or kidney disease and suddenly get edema.  You have a fever and your symptoms suddenly get worse. MAKE SURE YOU:   Understand these instructions.  Will watch your condition.  Will get help right away if you are not doing well or get worse. Document Released: 07/15/2005 Document Revised: 11/29/2013 Document Reviewed: 05/07/2013 [facility] Patient Information 2015 [place], [place]. This information is not intended to replace advice given to you by your health care provider. Make sure  you discuss any questions you have with your health care provider.

## 2014-02-24 ENCOUNTER — Ambulatory Visit (HOSPITAL_COMMUNITY)
Admission: RE | Admit: 2014-02-24 | Discharge: 2014-02-24 | Disposition: A | Discharge status: Home / Self Care | Payer: 59 | Source: Ambulatory Visit | Attending: Neurology | Admitting: Neurology

## 2014-02-24 DIAGNOSIS — I639 Cerebral infarction, unspecified: Secondary | ICD-10-CM

## 2014-02-24 DIAGNOSIS — M5126 Other intervertebral disc displacement, lumbar region: Secondary | ICD-10-CM | POA: Insufficient documentation

## 2014-02-24 DIAGNOSIS — M79605 Pain in left leg: Secondary | ICD-10-CM

## 2014-02-24 DIAGNOSIS — M545 Low back pain, unspecified: Secondary | ICD-10-CM | POA: Insufficient documentation

## 2014-02-24 DIAGNOSIS — M79604 Pain in right leg: Secondary | ICD-10-CM

## 2014-02-24 DIAGNOSIS — M79609 Pain in unspecified limb: Secondary | ICD-10-CM | POA: Insufficient documentation

## 2014-02-24 DIAGNOSIS — M791 Myalgia, unspecified site: Secondary | ICD-10-CM

## 2014-03-09 ENCOUNTER — Telehealth: Payer: Self-pay | Admitting: Family Medicine

## 2014-03-09 NOTE — Telephone Encounter (Signed)
Message copied by Thurmon Fair on Wed Mar 09, 2014  3:29 PM ------      Message from: Cameron Sprang      Created: Wed Mar 09, 2014  2:26 PM      Regarding: pls request MRI disc       Can you pls request for disc copy of her MRI brain done at [facility]? Thanks! ------

## 2014-03-09 NOTE — Telephone Encounter (Signed)
[facility]. Requested copy of MRI Brain on disc, was told I needed to fax a written request for disc. Faxed over request with patients information and address to be forwarded to.

## 2014-03-14 ENCOUNTER — Encounter: Payer: Self-pay | Admitting: Neurology

## 2014-03-14 ENCOUNTER — Ambulatory Visit (INDEPENDENT_AMBULATORY_CARE_PROVIDER_SITE_OTHER): Payer: 59 | Admitting: Neurology

## 2014-03-14 VITALS — BP 112/64 | HR 74 | Ht 63.0 in | Wt 120.0 lb

## 2014-03-14 DIAGNOSIS — I6350 Cerebral infarction due to unspecified occlusion or stenosis of unspecified cerebral artery: Secondary | ICD-10-CM

## 2014-03-14 DIAGNOSIS — G43909 Migraine, unspecified, not intractable, without status migrainosus: Secondary | ICD-10-CM

## 2014-03-14 DIAGNOSIS — I639 Cerebral infarction, unspecified: Secondary | ICD-10-CM

## 2014-03-14 NOTE — Progress Notes (Signed)
NEUROLOGY FOLLOW UP OFFICE NOTE  CAREY LAFON [id number]  HISTORY OF PRESENT ILLNESS: I had the pleasure of seeing Susen Haskew in follow-up in the neurology clinic on 03/14/2014.  The patient was last seen a month ago after she had an episode of left-sided paresthesias, tongue heaviness, and inability to speak last 01/22/14.  Her MRI brain had reported findings concerning for stroke, however per Neurology notes, there was no convincing evidence of a right frontal stroke.  Disc has been requested for review however still unavailable at this time.  She denies any further recurrence of similar symptoms.  She does have a history of migraines, however denies having a migraine that day.  Migraine frequency has increased this summer, as heat is a trigger for her.  She has also noticed sensitivity to smells.  Sometimes, she feels she is about to have a migraine when the left temporal region feels like it it twitching for a few minutes.  She usually takes Aleve, and reports taking it only when headache is severe. Sometimes she would need to take it daily.  She has been taking daily aspirin and would forget to take her Zegerid, causing more reflux symptoms.    On her initial visit, she had also reported back pain and pain in her feet. 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 She started feeling numbness in her tongue and throat and felt that she could not talk. Witnesses said she talked like she was drunk. She recalls touching  her throat with her right hand and hearing people around her, but it seemed like she could not see peripherally on both sides, "like tunnel vision" seeing only what was in front of her. She had other nurses with her, and she was noted to have difficulty raising her left arm. She was told she looked pale, and was instructed to take an aspirin, and recalls coughing while trying to take it. She denied any headache at that time. They tell me that EMS still noted the left-sided weakness. She was taken to [facility], records from her hospitalization were reviewed. It appears that symptoms resolved after 1-2 hours, NIHSS was 0 on Neurology evaluation. She underwent stroke workup, per MRI brain report, there is a small focus or two contiguous foci of restricted diffusion in the right frontal lobe, with FLAIR and ADC correlates. This is anterior to the precentral gyrus. Per Neurology note, "there is no convincing evidence of a right frontal stroke" because there was no corresponding ADC signal on his review, doubt small stroke, may be due to migraine with subtle increased diffusion signal. Images unavailable for review at this time. Her EKG showed sinus rhythm with sinus arrhythmia. TTE showed a PFO. Her hypercoagulable workup with anticardiolipin and antiphospholipid antibodies, prothrombin, factor V Leiden, protein S, C, lupus anticoagulant, antithrombin III, homocysteine were negative. B12, rpr, tsh, folate, hba1c 5.5, lipid panel were normal. Urine drug screen normal. She was discharged home on aspirin 81mg   daily.   She has been evaluated by Cardiology after hospital discharge, TEE done showed atrial septum is mobile but not aneurysmal by definition. There is a small PFO with very few microbubbles crossing from right to left with provocation. Closure was not recommended due to small size and intermittent nature of PFO.   She had refused lower extremity dopplers in the hospital because she has had chronic  leg pain for for at least 7 years and has had 2 normal venous dopplers in the past. She reports pain starts in her feet, radiating behind her legs up to her back. She feels uncomfortable bending her knees with pain behind the knees. 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 She has a history of migraines for many years, usually triggered by heat. Headaches are over the frontal region, throbbing, with associated photophobia and nausea where she needs to lie down. She starts feeling flushed then dizzy, Aleve usually helps. She has visual auras of floaters.   PAST MEDICAL HISTORY: Past Medical History  Diagnosis Date  . GERD (gastroesophageal reflux disease)   . Breast calcification, left 07/29/04  . Thyroid nodule     s/p FNA- 4/09- benign  . Vitamin D deficiency 03/08/2011  . Headache(784.0) 01/03/2013  . Other and unspecified hyperlipidemia 02/08/2013  . Unspecified hypothyroidism 01/15/2011  . Preventative health care 01/22/2011  . Menorrhagia 03/07/2013  . Thyroid nodule 01/15/2011    H/o thyroid nodule   . Migraine headache with aura 01/03/2013    MEDICATIONS: Current Outpatient Prescriptions on File Prior to Visit  Medication Sig Dispense Refill  . aspirin EC 81 MG tablet Take 81 mg by mouth daily.      . B Complex-C (SUPER B COMPLEX) TABS Take 1 tablet by mouth daily.      . Calcium Carbonate-Vitamin D (CALTRATE 600+D PO) Take 1 tablet by mouth daily.      . Cholecalciferol (VITAMIN D-3) 5000 UNITS TABS Take 5,000 Units by mouth daily.      . ferrous sulfate 325 (65 FE) MG tablet Take 325 mg by mouth daily with breakfast.      . levonorgestrel (MIRENA) 20 MCG/24HR IUD 1 each by Intrauterine route once.        . Multiple Vitamins-Minerals (MULTIVITAMIN WITH MINERALS) tablet Take 1 tablet by mouth daily.      . naproxen sodium (ALEVE) 220 MG tablet Take 440 mg by mouth daily as needed (headache).      . Omega-3 Fatty Acids (EQL OMEGA 3 FISH OIL) 1400 MG CAPS Take 1,400 mg by  mouth daily.       [name] omeprazole-sodium bicarbonate (ZEGERID) 40-1100 MG per capsule Take 1 capsule by mouth daily before breakfast.  30 capsule  2  . traMADol (ULTRAM) 50 MG tablet Take 1 tablet (50 mg total) by mouth every 8 (eight) hours as needed for pain.  30 tablet  1  . vitamin C (ASCORBIC ACID) 500 MG tablet Take 500 mg by mouth daily.      . vitamin E 400 UNIT capsule Take 400 Units by mouth daily.       No current facility-administered medications on file prior to visit.    ALLERGIES: No Known Allergies  FAMILY HISTORY: Family History  Problem Relation Age of Onset  . Stroke Mother   . Arthritis Mother   . Heart disease Father     CHF?    SOCIAL HISTORY: History   Social History  . Marital  Status: Married    Spouse Name: N/A    Number of Children: N/A  . Years of Education: N/A   Occupational History  . Not on file.   Social History Main Topics  . Smoking status: Former Smoker -- 5 years  . Smokeless tobacco: Never Used  . Alcohol Use: Yes     Comment: occasional  . Drug Use: No  . Sexual Activity: Not on file   Other Topics Concern  . Not on file   Social History Narrative   Regular exercise:  No   Caffeine: 1-2 cups coffee / tea daily.   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     REVIEW OF SYSTEMS: Constitutional: No fevers, chills, or sweats, + generalized fatigue, change in appetite Eyes: No visual changes, double vision, eye pain Ear, nose and throat: No hearing loss, ear pain, nasal congestion, sore throat Cardiovascular: No chest pain, palpitations Respiratory:  No shortness of breath at rest or with exertion, wheezes GastrointestinaI: No nausea, vomiting, diarrhea, abdominal pain, fecal incontinence Genitourinary:  No dysuria, urinary retention or frequency Musculoskeletal:  + neck pain, back pain Integumentary: No rash, pruritus, skin lesions Neurological: as above Psychiatric: No  depression, insomnia, anxiety Endocrine: No palpitations, fatigue, diaphoresis, mood swings, change in appetite, change in weight, increased thirst Hematologic/Lymphatic:  No anemia, purpura, petechiae. Allergic/Immunologic: no itchy/runny eyes, nasal congestion, recent allergic reactions, rashes  PHYSICAL EXAM: Filed Vitals:   03/14/14 0831  BP: 112/64  Pulse: 74   General: No acute distress Head:  Normocephalic/atraumatic Neck: supple, no paraspinal tenderness, full range of motion Heart:  Regular rate and rhythm Lungs:  Clear to auscultation bilaterally Back: No paraspinal tenderness Skin/Extremities: No rash, no edema Neurological Exam: alert and oriented to person, place, and time. No aphasia or dysarthria. Fund of knowledge is appropriate.  Recent and remote memory are intact.  Attention and concentration are normal.    Able to name objects and repeat phrases. Cranial nerves: Pupils equal, round, reactive to light.  Fundoscopic exam unremarkable, no papilledema. Extraocular movements intact with no nystagmus. Visual fields full. Facial sensation intact. No facial asymmetry. Tongue, uvula, palate midline.  Motor: Bulk and tone normal, muscle strength 5/5 throughout with no pronator drift.  Sensation to light touch intact.  No extinction to double simultaneous stimulation.  Deep tendon reflexes 2+ throughout, toes downgoing.  Finger to nose testing intact.  Gait narrow-based and steady, able to tandem walk adequately.  Romberg negative.  IMPRESSION: This is a 43 yo RH woman with a history of migraines, who had a transient episode of left sided numbness and mild weakness with numbness in her tongue and throat last 01/22/14. Per records, MRI brain had shown diffusion signal abnormality in the right frontal region, which would correlate with her symptoms, however per neurology report, there was no ADC correlate to convincingly determine this was an acute infarct and symptoms were felt to be  possibly related to complicated migraine. She had a PFO on echo, TEE showed this to be small and intermittent, with no atrial septal aneurysm. We have requested MRI disc for review, however this is still unavailable.  We again discussed her symptoms, small stroke versus complicated migraine.  Continue daily aspirin.  We again discussed current recommendations for stroke and PFO, antiplatelet treatment rather than PFO closure, except if there is evidence of DVT. She had refused venous dopplers due to having them done twice  due to chronic leg pain in the past. We also discussed her back pain and unremarkable MRI lumbar spine, she would likely benefit from PT however she would like to hold off on this for now and will start doing exercise instead.  We discussed migraines and option for daily headache prophylaxis, she would like to hold off and monitor headache frequency for now. She knows to minimize use of rescue medications to 2-3 a week to avoid rebound headaches.  She will follow-up in 4 months and knows to go to the ER immediately if symptoms recur.  Thank you for allowing me to participate in her care.  Please do not hesitate to call for any questions or concerns.  The duration of this appointment visit was 15 minutes of face-to-face time with the patient.  Greater than 50% of this time was spent in counseling, explanation of diagnosis, planning of further management, and coordination of care.   Ellouise Newer, M.D.   CC: Dr. Charlett Blake

## 2014-03-14 NOTE — Patient Instructions (Signed)
1. Continue daily aspirin 2. Start yoga and stretching exercises for back pain 3. If symptoms recur, go to ER immediately

## 2014-03-21 ENCOUNTER — Telehealth: Payer: Self-pay | Admitting: Neurology

## 2014-03-21 NOTE — Telephone Encounter (Signed)
Left VM to call back 

## 2014-03-23 NOTE — Telephone Encounter (Signed)
Left VM for patient to leave best time and number to call her back

## 2014-03-23 NOTE — Telephone Encounter (Signed)
Pt called/returning your call at 12:40PM C/B [phone]

## 2014-03-24 NOTE — Telephone Encounter (Signed)
Left VM to leave best time and number to call her back.

## 2014-04-11 ENCOUNTER — Ambulatory Visit (INDEPENDENT_AMBULATORY_CARE_PROVIDER_SITE_OTHER): Payer: 59 | Admitting: Physician Assistant

## 2014-04-11 ENCOUNTER — Encounter: Payer: Self-pay | Admitting: Physician Assistant

## 2014-04-11 VITALS — BP 118/81 | HR 70 | Temp 98.3°F | Resp 14 | Ht 63.0 in | Wt 117.5 lb

## 2014-04-11 DIAGNOSIS — M5380 Other specified dorsopathies, site unspecified: Secondary | ICD-10-CM

## 2014-04-11 DIAGNOSIS — M62830 Muscle spasm of back: Secondary | ICD-10-CM | POA: Insufficient documentation

## 2014-04-11 MED ORDER — METHOCARBAMOL 500 MG PO TABS: 500.0000 mg | ORAL_TABLET | Freq: Three times a day (TID) | ORAL | Status: DC

## 2014-04-11 NOTE — Progress Notes (Signed)
Patient presents to clinic today c/o left sided thoracic back pain and right sided neck pain over the past few days first noticed after getting dressed the other morning.  Denies trauma or injury.  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  Denies radiation of pain elsewhere.  Has tried some ibuprofen for symptoms with little relief.  Past Medical History  Diagnosis Date  . GERD (gastroesophageal reflux disease)   . Breast calcification, left 07/29/04  . Thyroid nodule     s/p FNA- 4/09- benign  . Vitamin D deficiency 03/08/2011  . Headache(784.0) 01/03/2013  . Other and unspecified hyperlipidemia 02/08/2013  . Unspecified hypothyroidism 01/15/2011  . Preventative health care 01/22/2011  . Menorrhagia 03/07/2013  . Thyroid nodule 01/15/2011    H/o thyroid nodule   . Migraine headache with aura 01/03/2013    Current Outpatient Prescriptions on File Prior to Visit  Medication Sig Dispense Refill  . aspirin EC 81 MG tablet Take 81 mg by mouth daily.      . B Complex-C (SUPER B COMPLEX) TABS Take 1 tablet by mouth daily.      . Calcium Carbonate-Vitamin D (CALTRATE 600+D PO) Take 1 tablet by mouth daily.      . Cholecalciferol (VITAMIN D-3) 5000 UNITS TABS Take 5,000 Units by mouth daily.      . ferrous sulfate 325 (65 FE) MG tablet Take 325 mg by mouth daily with breakfast.      . levonorgestrel (MIRENA) 20 MCG/24HR IUD 1 each by Intrauterine route once.        . Multiple Vitamins-Minerals (MULTIVITAMIN WITH MINERALS) tablet Take 1 tablet by mouth daily.      . naproxen sodium (ALEVE) 220 MG tablet Take 440 mg by mouth daily as needed (headache).      . Omega-3 Fatty Acids (EQL OMEGA 3 FISH OIL) 1400 MG CAPS Take 1,400 mg by mouth daily.       [name] omeprazole-sodium bicarbonate (ZEGERID) 40-1100 MG per capsule Take 1 capsule by mouth daily before breakfast.  30 capsule  2  . traMADol (ULTRAM) 50 MG tablet Take 1 tablet (50 mg total) by mouth every 8 (eight) hours as needed for pain.  30 tablet  1  .  vitamin C (ASCORBIC ACID) 500 MG tablet Take 500 mg by mouth daily.      . vitamin E 400 UNIT capsule Take 400 Units by mouth daily.       No current facility-administered medications on file prior to visit.    No Known Allergies  Family History  Problem Relation Age of Onset  . Stroke Mother   . Arthritis Mother   . Heart disease Father     CHF?    History   Social History  . Marital Status: Married    Spouse Name: N/A    Number of Children: N/A  . Years of Education: N/A   Social History Main Topics  . Smoking status: Former Smoker -- 5 years  . Smokeless tobacco: Never Used  . Alcohol Use: Yes     Comment: occasional  . Drug Use: No  . Sexual Activity: None   Other Topics Concern  . None   Social History Narrative   Regular exercise:  No   Caffeine: 1-2 cups coffee / tea daily.   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     Review of Systems - See  HPI.  All other ROS are negative.  BP 118/81  Pulse 70  Temp(Src) 98.3 F (36.8 C) (Oral)  Resp 14  Ht '5\' 3"'  (1.6 m)  Wt 117 lb 8 oz (53.298 kg)  BMI 20.82 kg/m2  SpO2 100%  LMP 04/04/2014  Physical Exam  Vitals reviewed. Constitutional: She is oriented to person, place, and time and well-developed, well-nourished, and in no distress.  HENT:  Head: Normocephalic and atraumatic.  Eyes: Conjunctivae are normal.  Cardiovascular: Normal rate, regular rhythm, normal heart sounds and intact distal pulses.   Pulmonary/Chest: Effort normal and breath sounds normal. No respiratory distress. She has no wheezes. She has no rales. She exhibits no tenderness.  Musculoskeletal:       Arms: Neurological: She is alert and oriented to person, place, and time.  Skin: Skin is warm and dry. No rash noted.  Psychiatric: Affect normal.    Recent Results (from the past 2160 hour(s))  BASIC METABOLIC PANEL     Status: None   Collection Time    02/04/14  9:51 AM      Result  Value Ref Range   Sodium 137  135 - 145 mEq/L   Potassium 4.0  3.5 - 5.1 mEq/L   Chloride 105  96 - 112 mEq/L   CO2 26  19 - 32 mEq/L   Glucose, Bld 88  70 - 99 mg/dL   BUN 13  6 - 23 mg/dL   Creatinine, Ser 0.5  0.4 - 1.2 mg/dL   Calcium 9.2  8.4 - 10.5 mg/dL   GFR 139.14  >60.00 mL/min  CBC WITH DIFFERENTIAL     Status: None   Collection Time    02/04/14  9:51 AM      Result Value Ref Range   WBC 6.6  4.0 - 10.5 K/uL   RBC 4.14  3.87 - 5.11 Mil/uL   Hemoglobin 12.4  12.0 - 15.0 g/dL   HCT 37.2  36.0 - 46.0 %   MCV 90.0  78.0 - 100.0 fl   MCHC 33.3  30.0 - 36.0 g/dL   RDW 13.6  11.5 - 15.5 %   Platelets 215.0  150.0 - 400.0 K/uL   Neutrophils Relative % 63.3  43.0 - 77.0 %   Lymphocytes Relative 30.2  12.0 - 46.0 %   Monocytes Relative 4.7  3.0 - 12.0 %   Eosinophils Relative 1.1  0.0 - 5.0 %   Basophils Relative 0.7  0.0 - 3.0 %   Neutro Abs 4.1  1.4 - 7.7 K/uL   Lymphs Abs 2.0  0.7 - 4.0 K/uL   Monocytes Absolute 0.3  0.1 - 1.0 K/uL   Eosinophils Absolute 0.1  0.0 - 0.7 K/uL   Basophils Absolute 0.0  0.0 - 0.1 K/uL  PROTIME-INR     Status: None   Collection Time    02/04/14  9:51 AM      Result Value Ref Range   INR 0.9  0.8 - 1.0 ratio   Prothrombin Time 10.3  9.6 - 13.1 sec  Aavya     Status: None   Collection Time    02/11/14 10:02 AM      Result Value Ref Range   Zariya NEG  NEGATIVE  SJOGREN'S SYNDROME ANTIBODS(SSA + SSB)     Status: None   Collection Time    02/11/14 10:02 AM      Result Value Ref Range   SSA (Ro) (ENA) Antibody, IgG <1.0 NEG  <1.0 NEG  AI   SSB (La) (ENA) Antibody, IgG <1.0 NEG  <1.0 NEG AI  RHEUMATOID FACTOR     Status: None   Collection Time    02/11/14 10:02 AM      Result Value Ref Range   Rheumatoid Factor <10  <=14 IU/mL   Comment:                                Interpretive Table                         Low Positive: 15 - 41 IU/mL                         High Positive:  >= 42 IU/mL            In addition to the RF result, and  clinical symptoms including joint      involvement, the 2010 ACR Classification Criteria for      scoring/diagnosing Rheumatoid Arthritis include the results of the      following tests:  CRP (12197), ESR (15010), and CCP (APCA) (58832).      [email or web address].org/practice/clinical/classification/ra/ra_2010.asp    Assessment/Plan: Muscle spasm of back Rx Robaxin TID.  Tylenol or Ibuprofen as needed.  Topical Aspercreme.  Heating pad.  Avoid heavy lifting or overexertion.  Return precautions discussed with patient.

## 2014-04-11 NOTE — Progress Notes (Signed)
Pre visit review using our clinic review tool, if applicable. No additional management support is needed unless otherwise documented below in the visit note/SLS  

## 2014-04-11 NOTE — Assessment & Plan Note (Signed)
Rx Robaxin TID.  Tylenol or Ibuprofen as needed.  Topical Aspercreme.  Heating pad.  Avoid heavy lifting or overexertion.  Return precautions discussed with patient.

## 2014-04-11 NOTE — Patient Instructions (Signed)
You have significant muscle spasms noted on examination, contributing to your pain.  Take Robaxin as directed.  Apply topical Aspercreme to the area.  Tylenol for pain.  Heating pad to the area.  Call or return to clinic if symptoms are not improving.

## 2014-04-14 ENCOUNTER — Telehealth: Payer: Self-pay | Admitting: Neurology

## 2014-04-14 DIAGNOSIS — M545 Low back pain: Secondary | ICD-10-CM

## 2014-04-14 NOTE — Telephone Encounter (Signed)
Discussed MRI brain disc reviewed showing small punctate infarcts. She denies any recurrence of symptoms however has significant low back pain and would now like to proceed with PT.    Tiffany, she would like to do PT for back pain at [facility] in [facility], pls send referral, thanks!

## 2014-04-14 NOTE — Telephone Encounter (Signed)
Order for outpt PT @ [facility] put in [place]. Patient will get a call to schedule 1st appt.

## 2014-04-14 NOTE — Telephone Encounter (Signed)
Pt called/returning your call at 11:35AM. C/B [phone]

## 2014-04-14 NOTE — Telephone Encounter (Signed)
Lmovm to return my call. 

## 2014-04-14 NOTE — Telephone Encounter (Signed)
Pt called f/u on the results of her MRI. C/B [phone]

## 2014-04-14 NOTE — Telephone Encounter (Signed)
lmovm to return my call  °

## 2014-04-14 NOTE — Telephone Encounter (Signed)
She was calling to speak with you about your thoughts on the review of her MRI brain scans. Best # to reach her at is [phone]. She states she is now ready for PT.

## 2014-04-14 NOTE — Telephone Encounter (Signed)
Pt called/returning your call at 10:45AM C/B [phone]

## 2014-04-20 ENCOUNTER — Ambulatory Visit: Payer: 59 | Attending: Neurology | Admitting: Physical Therapy

## 2014-04-20 DIAGNOSIS — M545 Low back pain, unspecified: Secondary | ICD-10-CM | POA: Insufficient documentation

## 2014-04-20 DIAGNOSIS — IMO0001 Reserved for inherently not codable concepts without codable children: Secondary | ICD-10-CM | POA: Insufficient documentation

## 2014-05-04 ENCOUNTER — Ambulatory Visit: Payer: 59 | Attending: Neurology | Admitting: Rehabilitation

## 2014-05-04 DIAGNOSIS — Z5189 Encounter for other specified aftercare: Secondary | ICD-10-CM | POA: Insufficient documentation

## 2014-05-04 DIAGNOSIS — M545 Low back pain: Secondary | ICD-10-CM | POA: Insufficient documentation

## 2014-05-11 ENCOUNTER — Ambulatory Visit: Payer: 59 | Admitting: Physical Therapy

## 2014-05-11 DIAGNOSIS — Z5189 Encounter for other specified aftercare: Secondary | ICD-10-CM | POA: Diagnosis not present

## 2014-05-18 ENCOUNTER — Ambulatory Visit: Payer: 59

## 2014-05-18 DIAGNOSIS — Z5189 Encounter for other specified aftercare: Secondary | ICD-10-CM | POA: Diagnosis not present

## 2014-05-24 ENCOUNTER — Ambulatory Visit: Payer: 59

## 2014-07-13 ENCOUNTER — Telehealth: Payer: Self-pay | Admitting: Neurology

## 2014-07-13 NOTE — Telephone Encounter (Signed)
Pt called to cancel her f/u appt for tomorrow 07/14/14. Pt has to work and will call later to r/s.

## 2014-07-14 ENCOUNTER — Ambulatory Visit: Payer: 59 | Admitting: Neurology

## 2014-08-01 ENCOUNTER — Telehealth: Payer: Self-pay | Admitting: Cardiovascular Disease

## 2014-08-01 NOTE — Telephone Encounter (Signed)
No abx indicated. thx

## 2014-08-01 NOTE — Telephone Encounter (Signed)
New message      1. What dental office are you calling from? Dr Truman Hayward   2. What is your office phone and fax number?office [phone]; fax [phone]   3. What type of procedure is the patient having performed?filling and a cleaning  4. What date is procedure scheduled? Cleaning is on 08-03-14.  Filling has not been scheduled  5. What is your question (ex. Antibiotics prior to procedure, holding medication-we need to know how long dentist wants pt to hold med)? Will pt need an antibiotic prior to procedures?  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

## 2014-08-02 NOTE — Telephone Encounter (Signed)
I will fax this note to [facility] at Dr Marguerita Beards office, [phone].

## 2014-11-21 ENCOUNTER — Encounter: Payer: Self-pay | Admitting: Family

## 2014-11-21 ENCOUNTER — Ambulatory Visit (INDEPENDENT_AMBULATORY_CARE_PROVIDER_SITE_OTHER): Payer: 59 | Admitting: Family

## 2014-11-21 VITALS — BP 101/76 | HR 69 | Temp 98.0°F | Resp 16 | Ht 63.5 in | Wt 125.8 lb

## 2014-11-21 DIAGNOSIS — M545 Low back pain, unspecified: Secondary | ICD-10-CM | POA: Insufficient documentation

## 2014-11-21 DIAGNOSIS — K219 Gastro-esophageal reflux disease without esophagitis: Secondary | ICD-10-CM | POA: Diagnosis not present

## 2014-11-21 MED ORDER — METHYLPREDNISOLONE 4 MG PO TBPK: ORAL_TABLET | ORAL | Status: DC

## 2014-11-21 NOTE — Assessment & Plan Note (Signed)
Reviewed MRI of the lumbar spine that she had down with Dr. 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 Will rx with medrol dose pak + prn robaxin (already has rx for this at home).  Pt to follow up if symptoms worsen or fail to improve.

## 2014-11-21 NOTE — Progress Notes (Signed)
Subjective:    Patient ID: Melinda Kirk, female    DOB: 1970-12-01, 44 y.o.   MRN: [id number]  HPI    Pt presents today with complaint of low back pain. Started yesterday AM while at church. Did some dancing the night before.  Reports that she knelt down at church she began to have some back pain. Then had trouble laying flat in the car. Took 440mg  of aleve yesterday when she got home. Did not touch the pain. After that she applied icy hot patch. Stayed home from work today.  Back pain does not radiate down the legs.   GERD- stopped zegerid.  Notes that she has had recurrent symptoms.   Review of Systems See HPI  Past Medical History  Diagnosis Date  . GERD (gastroesophageal reflux disease)   . Breast calcification, left 07/29/04  . Thyroid nodule     s/p FNA- 4/09- benign  . Vitamin D deficiency 03/08/2011  . Headache(784.0) 01/03/2013  . Other and unspecified hyperlipidemia 02/08/2013  . Unspecified hypothyroidism 01/15/2011  . Preventative health care 01/22/2011  . Menorrhagia 03/07/2013  . Thyroid nodule 01/15/2011    H/o thyroid nodule   . Migraine headache with aura 01/03/2013    History   Social History  . Marital Status: Married    Spouse Name: N/A  . Number of Children: N/A  . Years of Education: N/A   Occupational History  . Not on file.   Social History Main Topics  . Smoking status: Former Smoker -- 5 years  . Smokeless tobacco: Never Used  . Alcohol Use: Yes     Comment: occasional  . Drug Use: No  . Sexual Activity: Not on file   Other Topics Concern  . Not on file   Social History Narrative   Regular exercise:  No   Caffeine: 1-2 cups coffee / tea daily.   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     Past Surgical History  Procedure Laterality Date  . Biopsy thyroid  10/2007    benign biopsy of thyroid nodules.  Darden Dates without cardioversion N/A 02/10/2014    Procedure: TRANSESOPHAGEAL ECHOCARDIOGRAM  (TEE);  Surgeon: [name], MD;  Location: [facility] ENDOSCOPY;  Service: Cardiovascular;  Laterality: N/A;    Family History  Problem Relation Age of Onset  . Stroke Mother   . Arthritis Mother   . Heart disease Father     CHF?    No Known Allergies  Current Outpatient Prescriptions on File Prior to Visit  Medication Sig Dispense Refill  . aspirin EC 81 MG tablet Take 81 mg by mouth daily.    . B Complex-C (SUPER B COMPLEX) TABS Take 1 tablet by mouth daily.    . Calcium Carbonate-Vitamin D (CALTRATE 600+D PO) Take 1 tablet by mouth daily.    . Cholecalciferol (VITAMIN D-3) 5000 UNITS TABS Take 5,000 Units by mouth daily.    . ferrous sulfate 325 (65 FE) MG tablet Take 325 mg by mouth daily with breakfast.    . methocarbamol (ROBAXIN) 500 MG tablet Take 1 tablet (500 mg total) by mouth 3 (three) times daily. 60 tablet 0  . Multiple Vitamins-Minerals (MULTIVITAMIN WITH MINERALS) tablet Take 1 tablet by mouth daily.    . naproxen sodium (ALEVE) 220 MG tablet Take 440 mg by mouth daily as needed (headache).    . Omega-3 Fatty Acids (EQL OMEGA 3 FISH  OIL) 1400 MG CAPS Take 1,400 mg by mouth daily.     [name] omeprazole-sodium bicarbonate (ZEGERID) 40-1100 MG per capsule Take 1 capsule by mouth daily before breakfast. (Patient not taking: Reported on 11/21/2014) 30 capsule 2  . vitamin C (ASCORBIC ACID) 500 MG tablet Take 500 mg by mouth daily.    . vitamin E 400 UNIT capsule Take 400 Units by mouth daily.     No current facility-administered medications on file prior to visit.    BP 82/58 mmHg  Pulse 69  Temp(Src) 98 F (36.7 C) (Oral)  Resp 16  Ht 5' 3.5" (1.613 m)  Wt 125 lb 12.8 oz (57.063 kg)  BMI 21.93 kg/m2  SpO2 99%  LMP 10/24/2014       Objective:   Physical Exam  Constitutional: She appears well-developed and well-nourished.  Cardiovascular: Normal rate, regular rhythm and normal heart sounds.   No murmur heard. Pulmonary/Chest: Effort normal and breath sounds  normal. No respiratory distress. She has no wheezes.  Musculoskeletal:       Lumbar back: She exhibits tenderness.  Bilateral LE strength is 5/5 Bilateral + straight leg raise L>R  Neurological:  Reflex Scores:      Patellar reflexes are 2+ on the right side and 2+ on the left side. Psychiatric: She has a normal mood and affect. Her behavior is normal. Judgment and thought content normal.          Assessment & Plan:

## 2014-11-21 NOTE — Patient Instructions (Addendum)
Start medrol dose pack. You may use methocarbamol as needed. Call if symptoms worsen or if symptoms do not improve in 1 week.  Restart zegerid.

## 2014-11-21 NOTE — Assessment & Plan Note (Signed)
Uncontrolled, resume zegerid.

## 2014-11-21 NOTE — Progress Notes (Signed)
Pre visit review using our clinic review tool, if applicable. No additional management support is needed unless otherwise documented below in the visit note. 

## 2014-12-21 ENCOUNTER — Telehealth: Payer: Self-pay | Admitting: *Deleted

## 2014-12-21 NOTE — Telephone Encounter (Signed)
FMLA paperwork received via fax from [employer]. Called and lm for pt to please call back. I need to know the reason for the forms.

## 2014-12-29 NOTE — Telephone Encounter (Signed)
Pt returned call and stated forms are for low back pain. She was seen by [facility] on 4/25 and was taken out of work until 4/29. FMLA is for a continuous leave, pt is now back at work. Forms completed as much as possible and forwarded to [facility] for completion. JG//CMA

## 2015-01-02 DIAGNOSIS — Z7689 Persons encountering health services in other specified circumstances: Secondary | ICD-10-CM

## 2015-01-04 NOTE — Telephone Encounter (Signed)
Forms were faxed on 01/02/15

## 2015-01-19 ENCOUNTER — Telehealth: Payer: Self-pay | Admitting: Family Medicine

## 2015-01-19 MED ORDER — OMEPRAZOLE 40 MG PO CPDR: 40.0000 mg | DELAYED_RELEASE_CAPSULE | Freq: Every day | ORAL | Status: DC

## 2015-01-19 NOTE — Telephone Encounter (Signed)
Patient returned phone call. Best [phone]

## 2015-01-19 NOTE — Telephone Encounter (Signed)
Pharmacy needs refill written for  Omeprazole 40 and sodium bicard as Zegerid is no longer available.  Advise if ok to fill as on list.

## 2015-01-19 NOTE — Telephone Encounter (Signed)
Called the patient left message to call back 

## 2015-01-19 NOTE — Telephone Encounter (Signed)
She will have to switch to Omeprazole 40 mg po daily disp #30 with 5 rf or #90 with 1 rf at patient discretion and then she needs appt if not working and she needs an appt by Fall for annual exam regardless

## 2015-01-19 NOTE — Telephone Encounter (Signed)
Patient informed of instructions.  Sent in omeprazole 40 #30 day supply to local pharmacy.  Patient did agree to schedule appt.

## 2015-01-26 ENCOUNTER — Ambulatory Visit (INDEPENDENT_AMBULATORY_CARE_PROVIDER_SITE_OTHER): Payer: 59 | Admitting: Family Medicine

## 2015-01-26 ENCOUNTER — Encounter: Payer: Self-pay | Admitting: Family Medicine

## 2015-01-26 VITALS — BP 110/68 | HR 64 | Temp 98.4°F | Ht 63.5 in | Wt 124.5 lb

## 2015-01-26 DIAGNOSIS — K219 Gastro-esophageal reflux disease without esophagitis: Secondary | ICD-10-CM

## 2015-01-26 DIAGNOSIS — N921 Excessive and frequent menstruation with irregular cycle: Secondary | ICD-10-CM

## 2015-01-26 DIAGNOSIS — R1013 Epigastric pain: Secondary | ICD-10-CM

## 2015-01-26 DIAGNOSIS — R490 Dysphonia: Secondary | ICD-10-CM | POA: Diagnosis not present

## 2015-01-26 DIAGNOSIS — R1310 Dysphagia, unspecified: Secondary | ICD-10-CM | POA: Diagnosis not present

## 2015-01-26 DIAGNOSIS — E782 Mixed hyperlipidemia: Secondary | ICD-10-CM | POA: Diagnosis not present

## 2015-01-26 DIAGNOSIS — R5383 Other fatigue: Secondary | ICD-10-CM

## 2015-01-26 DIAGNOSIS — E559 Vitamin D deficiency, unspecified: Secondary | ICD-10-CM | POA: Diagnosis not present

## 2015-01-26 LAB — CBC
HCT: 37.4 % (ref 36.0–46.0)
HEMOGLOBIN: 12.3 g/dL (ref 12.0–15.0)
MCHC: 32.7 g/dL (ref 30.0–36.0)
MCV: 90 fl (ref 78.0–100.0)
Platelets: 209 10*3/uL (ref 150.0–400.0)
RBC: 4.16 Mil/uL (ref 3.87–5.11)
RDW: 13.2 % (ref 11.5–15.5)
WBC: 6.1 10*3/uL (ref 4.0–10.5)

## 2015-01-26 LAB — LIPID PANEL
Cholesterol: 186 mg/dL (ref 0–200)
HDL: 60 mg/dL (ref >39.00)
LDL CALC: 111 mg/dL — AB (ref 0–99)
NonHDL: 126
Total CHOL/HDL Ratio: 3
Triglycerides: 77 mg/dL (ref 0.0–149.0)
VLDL: 15.4 mg/dL (ref 0.0–40.0)

## 2015-01-26 LAB — COMPREHENSIVE METABOLIC PANEL
ALK PHOS: 48 U/L (ref 39–117)
ALT: 13 U/L (ref 0–35)
AST: 17 U/L (ref 0–37)
Albumin: 4.1 g/dL (ref 3.5–5.2)
BUN: 13 mg/dL (ref 6–23)
CHLORIDE: 106 meq/L (ref 96–112)
CO2: 31 meq/L (ref 19–32)
Calcium: 9.3 mg/dL (ref 8.4–10.5)
Creatinine, Ser: 0.56 mg/dL (ref 0.40–1.20)
GFR: 124.36 mL/min (ref >60.00)
Glucose, Bld: 73 mg/dL (ref 70–99)
Potassium: 3.9 mEq/L (ref 3.5–5.1)
Sodium: 140 mEq/L (ref 135–145)
Total Bilirubin: 0.4 mg/dL (ref 0.2–1.2)
Total Protein: 7.2 g/dL (ref 6.0–8.3)

## 2015-01-26 LAB — VITAMIN D 25 HYDROXY (VIT D DEFICIENCY, FRACTURES): VITD: 29.68 ng/mL — ABNORMAL LOW (ref 30.00–100.00)

## 2015-01-26 LAB — H. PYLORI ANTIBODY, IGG: H Pylori IgG: NEGATIVE

## 2015-01-26 LAB — TSH: TSH: 0.44 u[IU]/mL (ref 0.35–4.50)

## 2015-01-26 MED ORDER — RANITIDINE HCL 300 MG PO TABS: 300.0000 mg | ORAL_TABLET | Freq: Every day | ORAL | Status: DC

## 2015-01-26 MED ORDER — SODIUM BICARBONATE 325 MG PO TABS: ORAL_TABLET | ORAL | Status: DC

## 2015-01-26 NOTE — Progress Notes (Signed)
Pre visit review using our clinic review tool, if applicable. No additional management support is needed unless otherwise documented below in the visit note. 

## 2015-01-26 NOTE — Patient Instructions (Signed)
Probiotic daily such as [employer] or [facility] or Online at [employer] has a 10 strain probiotic available Food Choices for Gastroesophageal Reflux Disease When you have gastroesophageal reflux disease (GERD), the foods you eat and your eating habits are very important. Choosing the right foods can help ease your discomfort.  WHAT GUIDELINES DO I NEED TO FOLLOW?   Choose fruits, vegetables, whole grains, and low-fat dairy products.   Choose low-fat meat, fish, and poultry.  Limit fats such as oils, salad dressings, butter, nuts, and avocado.   Keep a food diary. This helps you identify foods that cause symptoms.   Avoid foods that cause symptoms. These may be different for everyone.   Eat small meals often instead of 3 large meals a day.   Eat your meals slowly, in a place where you are relaxed.   Limit fried foods.   Cook foods using methods other than frying.   Avoid drinking alcohol.   Avoid drinking large amounts of liquids with your meals.   Avoid bending over or lying down until 2-3 hours after eating.  WHAT FOODS ARE NOT RECOMMENDED?  These are some foods and drinks that may make your symptoms worse: Vegetables Tomatoes. Tomato juice. Tomato and spaghetti sauce. Chili peppers. Onion and garlic. Horseradish. Fruits Oranges, grapefruit, and lemon (fruit and juice). Meats High-fat meats, fish, and poultry. This includes hot dogs, ribs, ham, sausage, salami, and bacon. Dairy Whole milk and chocolate milk. Sour cream. Cream. Butter. Ice cream. Cream cheese.  Drinks Coffee and tea. Bubbly (carbonated) drinks or energy drinks. Condiments Hot sauce. Barbecue sauce.  Sweets/Desserts Chocolate and cocoa. Donuts. Peppermint and spearmint. Fats and Oils High-fat foods. This includes [place] fries and potato chips. Other Vinegar. Strong spices. This includes black pepper, white pepper, red pepper, cayenne, curry powder,  cloves, ginger, and chili powder. The items listed above may not be a complete list of foods and drinks to avoid. Contact your dietitian for more information. Document Released: 01/14/2012 Document Revised: 07/20/2013 Document Reviewed: 05/19/2013 [facility] Patient Information 2015 [place], [place]. This information is not intended to replace advice given to you by your health care provider. Make sure you discuss any questions you have with your health care provider.

## 2015-01-27 ENCOUNTER — Encounter: Payer: Self-pay | Admitting: Internal Medicine

## 2015-01-27 ENCOUNTER — Encounter: Payer: Self-pay | Admitting: Family Medicine

## 2015-02-05 ENCOUNTER — Encounter: Payer: Self-pay | Admitting: Family Medicine

## 2015-02-05 NOTE — Assessment & Plan Note (Addendum)
Symptoms worsening, H Pylori is negative. Avoid offending foods, take probiotics. Do not eat large meals in late evening and consider raising head of bed. Omeprazole qam and ranitidine qpm and referred to gastroenterology for further consideration

## 2015-02-05 NOTE — Progress Notes (Signed)
Melinda Kirk  [id number] April 03, 1971 02/05/2015      Progress Note-Follow Up  Subjective  Chief Complaint  Chief Complaint  Patient presents with  . Follow-up    HPI  Patient is a 44 y.o. female in today for routine medical care. Patient is in today for follow-up on numerous medical conditions. She denies any acute febrile illness but is having trouble with her heartburn. Has dyspepsia with epigastric discomfort intermittently. Is worse when she eats too close to bed time or the wrong foods but can occur randomly. Bowels are moving forward adequately. Denies CP/palp/SOB/HA/congestion/fevers or GU c/o. Taking meds as prescribed  Past Medical History  Diagnosis Date  . GERD (gastroesophageal reflux disease)   . Breast calcification, left 07/29/04  . Thyroid nodule     s/p FNA- 4/09- benign  . Vitamin D deficiency 03/08/2011  . Headache(784.0) 01/03/2013  . Other and unspecified hyperlipidemia 02/08/2013  . Unspecified hypothyroidism 01/15/2011  . Preventative health care 01/22/2011  . Menorrhagia 03/07/2013  . Thyroid nodule 01/15/2011    H/o thyroid nodule   . Migraine headache with aura 01/03/2013    Past Surgical History  Procedure Laterality Date  . Biopsy thyroid  10/2007    benign biopsy of thyroid nodules.  Darden Dates without cardioversion N/A 02/10/2014    Procedure: TRANSESOPHAGEAL ECHOCARDIOGRAM (TEE);  Surgeon: [name], MD;  Location: [facility] ENDOSCOPY;  Service: Cardiovascular;  Laterality: N/A;    Family History  Problem Relation Age of Onset  . Stroke Mother   . Arthritis Mother   . Heart disease Father     CHF?    History   Social History  . Marital Status: Married    Spouse Name: N/A  . Number of Children: N/A  . Years of Education: N/A   Occupational History  . Not on file.   Social History Main Topics  . Smoking status: Former Smoker -- 5 years  . Smokeless tobacco: Never Used  . Alcohol Use: Yes     Comment: occasional  . Drug Use: No  . Sexual  Activity: Not on file   Other Topics Concern  . Not on file   Social History Narrative   Regular exercise:  No   Caffeine: 1-2 cups coffee / tea daily.   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     Current Outpatient Prescriptions on File Prior to Visit  Medication Sig Dispense Refill  . aspirin EC 81 MG tablet Take 81 mg by mouth daily.    . B Complex-C (SUPER B COMPLEX) TABS Take 1 tablet by mouth daily.    . Calcium Carbonate-Vitamin D (CALTRATE 600+D PO) Take 1 tablet by mouth daily.    . Cholecalciferol (VITAMIN D-3) 5000 UNITS TABS Take 5,000 Units by mouth daily.    . ferrous sulfate 325 (65 FE) MG tablet Take 325 mg by mouth daily with breakfast.    . methocarbamol (ROBAXIN) 500 MG tablet Take 1 tablet (500 mg total) by mouth 3 (three) times daily. 60 tablet 0  . Multiple Vitamins-Minerals (MULTIVITAMIN WITH MINERALS) tablet Take 1 tablet by mouth daily.    . naproxen sodium (ALEVE) 220 MG tablet Take 440 mg by mouth daily as needed (headache).    . Omega-3 Fatty Acids (EQL OMEGA 3 FISH OIL) 1400 MG CAPS Take 1,400 mg by mouth daily.     [name] omeprazole (PRILOSEC) 40 MG capsule Take  1 capsule (40 mg total) by mouth daily. 30 capsule 1  . vitamin C (ASCORBIC ACID) 500 MG tablet Take 500 mg by mouth daily.    . vitamin E 400 UNIT capsule Take 400 Units by mouth daily.     No current facility-administered medications on file prior to visit.    No Known Allergies  Review of Systems  Review of Systems  Constitutional: Negative for fever and malaise/fatigue.  HENT: Negative for congestion.   Eyes: Negative for discharge.  Respiratory: Negative for shortness of breath.   Cardiovascular: Negative for chest pain, palpitations and leg swelling.  Gastrointestinal: Positive for heartburn and abdominal pain. Negative for nausea, vomiting, diarrhea, constipation and blood in stool.  Genitourinary: Negative for dysuria.    Musculoskeletal: Negative for falls.  Skin: Negative for rash.  Neurological: Negative for loss of consciousness and headaches.  Endo/Heme/Allergies: Negative for polydipsia.  Psychiatric/Behavioral: Negative for depression and suicidal ideas. The patient is not nervous/anxious and does not have insomnia.     Objective  BP 110/68 mmHg  Pulse 64  Temp(Src) 98.4 F (36.9 C) (Oral)  Ht 5' 3.5" (1.613 m)  Wt 124 lb 8 oz (56.473 kg)  BMI 21.71 kg/m2  SpO2 97%  LMP 01/26/2015  Physical Exam  Physical Exam  Constitutional: She is oriented to person, place, and time and well-developed, well-nourished, and in no distress. No distress.  HENT:  Head: Normocephalic and atraumatic.  Right Ear: External ear normal.  Left Ear: External ear normal.  Nose: Nose normal.  Mouth/Throat: Oropharynx is clear and moist. No oropharyngeal exudate.  Eyes: Conjunctivae are normal. Pupils are equal, round, and reactive to light. Right eye exhibits no discharge. Left eye exhibits no discharge. No scleral icterus.  Neck: Normal range of motion. Neck supple. No thyromegaly present.  Cardiovascular: Normal rate, regular rhythm, normal heart sounds and intact distal pulses.   No murmur heard. Pulmonary/Chest: Effort normal and breath sounds normal. No respiratory distress. She has no wheezes. She has no rales.  Abdominal: Soft. Bowel sounds are normal. She exhibits no distension and no mass. There is tenderness. There is no rebound and no guarding.  Musculoskeletal: Normal range of motion. She exhibits no edema or tenderness.  Lymphadenopathy:    She has no cervical adenopathy.  Neurological: She is alert and oriented to person, place, and time. She has normal reflexes. No cranial nerve deficit. Coordination normal.  Skin: Skin is warm and dry. No rash noted. She is not diaphoretic.  Psychiatric: Mood, memory and affect normal.    Lab Results  Component Value Date   TSH 0.44 01/26/2015   Lab Results   Component Value Date   WBC 6.1 01/26/2015   HGB 12.3 01/26/2015   HCT 37.4 01/26/2015   MCV 90.0 01/26/2015   PLT 209.0 01/26/2015   Lab Results  Component Value Date   CREATININE 0.56 01/26/2015   BUN 13 01/26/2015   NA 140 01/26/2015   K 3.9 01/26/2015   CL 106 01/26/2015   CO2 31 01/26/2015   Lab Results  Component Value Date   ALT 13 01/26/2015   AST 17 01/26/2015   ALKPHOS 48 01/26/2015   BILITOT 0.4 01/26/2015   Lab Results  Component Value Date   CHOL 186 01/26/2015   Lab Results  Component Value Date   HDL 60.00 01/26/2015   Lab Results  Component Value Date   LDLCALC 111* 01/26/2015   Lab Results  Component Value Date   TRIG 77.0 01/26/2015  Lab Results  Component Value Date   CHOLHDL 3 01/26/2015     Assessment & Plan  Vitamin D deficiency Very mild at this time. Needs Vitamin D 5000 IU daily  GERD (gastroesophageal reflux disease) Symptoms worsening, H Pylori is negative. Avoid offending foods, take probiotics. Do not eat large meals in late evening and consider raising head of bed. Omeprazole qam and ranitidine qpm and referred to gastroenterology for further consideration  Hyperlipidemia, mixed Encouraged heart healthy diet, increase exercise, avoid trans fats, consider a krill oil cap daily

## 2015-02-05 NOTE — Assessment & Plan Note (Signed)
Encouraged heart healthy diet, increase exercise, avoid trans fats, consider a krill oil cap daily 

## 2015-02-05 NOTE — Assessment & Plan Note (Signed)
Very mild at this time. Needs Vitamin D 5000 IU daily

## 2015-04-05 ENCOUNTER — Ambulatory Visit (INDEPENDENT_AMBULATORY_CARE_PROVIDER_SITE_OTHER): Payer: 59 | Admitting: Internal Medicine

## 2015-04-05 ENCOUNTER — Encounter: Payer: Self-pay | Admitting: Internal Medicine

## 2015-04-05 VITALS — BP 92/66 | HR 64 | Ht 63.5 in | Wt 125.6 lb

## 2015-04-05 DIAGNOSIS — K219 Gastro-esophageal reflux disease without esophagitis: Secondary | ICD-10-CM | POA: Diagnosis not present

## 2015-04-05 DIAGNOSIS — R1310 Dysphagia, unspecified: Secondary | ICD-10-CM

## 2015-04-05 NOTE — Patient Instructions (Signed)
You have been scheduled for an endoscopy. Please follow written instructions given to you at your visit today. If you use inhalers (even only as needed), please bring them with you on the day of your procedure. Your physician has requested that you go to www.startemmi.com and enter the access code given to you at your visit today. This web site gives a general overview about your procedure. However, you should still follow specific instructions given to you by our office regarding your preparation for the procedure.   You may take liquid Gavison as needed for reflux.    I appreciate the opportunity to care for you. Silvano Rusk, MD, [facility]

## 2015-04-05 NOTE — Assessment & Plan Note (Signed)
Not classic

## 2015-04-05 NOTE — Progress Notes (Signed)
  Referred by Dr. Charlett Blake Subjective:    Patient ID: Melinda Kirk, female    DOB: 01/08/1971, 44 y.o.   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 Several + yrs of epigastric pain/burning and was better on Zegerid but cost became too high. Changed to omeprazole 40 mg and added sodium bicarb tablet to try to mimic Zegerid. Not working too well  - she has dysphagia, odynophagia and sometimes gets globus sensation. If forgets to eat will have epigastric pain or if eats too much also. GI ROS o/w negative Has used Tums w/ some help and also adds ranidtidine. Soome nocturnal sxs. If goes w/o PPI gets severe sxs. Also may have a dry cough and hoarseness. Medications, allergies, past medical history, past surgical history, family history and social history are reviewed and updated in the EMR.   Review of Systems As per HPI, admits to some stress at job - low back pains/sciatica, joint pain All other ROS neg    Objective:   Physical Exam @BP  92/66 mmHg  Pulse 64  Ht 5' 3.5" (1.613 m)  Wt 125 lb 9.6 oz (56.972 kg)  BMI 21.90 kg/m2  LMP 03/23/2015 (Approximate)@  General:  Well-developed, well-nourished and in no acute distress Eyes:  anicteric. ENT:   Mouth and posterior pharynx free of lesions.  Neck:   supple w/o thyromegaly or mass.  Lungs: Clear to auscultation bilaterally. Heart:  S1S2, no rubs, murmurs, gallops. Abdomen:  soft, non-tender, no hepatosplenomegaly, hernia, or mass and BS+.  Lymph:  no cervical or supraclavicular adenopathy. Extremities:   no edema, cyanosis or clubbing Skin   no rash. Neuro:  A&O x 3.  Psych:  appropriate mood and  Affect.   Data Reviewed:  PCP noptes, labs in EMR for 2016      Assessment & Plan:  Gastroesophageal reflux disease, esophagitis presence not specified - Plan: Ambulatory referral to Gastroenterology  Dysphagia - Plan: Ambulatory referral to  Gastroenterology  I don't think her sxs are classic and would expect a better response to PPI if all GERD. ? Motility issues or perhaps functional dyspepsia   She needs an EGD to evaluate for stricture, mucosal problems. H pylori serology neg already.  The risks and benefits as well as alternatives of endoscopic procedure(s) have been discussed and reviewed. All questions answered. The patient agrees to proceed.   I appreciate the opportunity to care for this patient. VE:LFYBO, Erline Levine, MD

## 2015-04-11 ENCOUNTER — Ambulatory Visit (AMBULATORY_SURGERY_CENTER): Payer: 59 | Admitting: Internal Medicine

## 2015-04-11 ENCOUNTER — Other Ambulatory Visit: Payer: Self-pay | Admitting: Family Medicine

## 2015-04-11 ENCOUNTER — Encounter: Payer: Self-pay | Admitting: Internal Medicine

## 2015-04-11 ENCOUNTER — Telehealth: Payer: Self-pay | Admitting: Internal Medicine

## 2015-04-11 VITALS — BP 112/77 | HR 61 | Temp 94.9°F | Resp 26 | Ht 63.0 in | Wt 125.0 lb

## 2015-04-11 DIAGNOSIS — B3781 Candidal esophagitis: Secondary | ICD-10-CM

## 2015-04-11 DIAGNOSIS — R1310 Dysphagia, unspecified: Secondary | ICD-10-CM

## 2015-04-11 DIAGNOSIS — K219 Gastro-esophageal reflux disease without esophagitis: Secondary | ICD-10-CM

## 2015-04-11 MED ORDER — FLUCONAZOLE 100 MG PO TABS: 100.0000 mg | ORAL_TABLET | Freq: Every day | ORAL | Status: DC

## 2015-04-11 MED ORDER — SODIUM CHLORIDE 0.9 % IV SOLN: 500.0000 mL | INTRAVENOUS | Status: DC

## 2015-04-11 MED ORDER — NYSTATIN 100000 UNIT/ML MT SUSP: 5.0000 mL | Freq: Four times a day (QID) | OROMUCOSAL | Status: AC

## 2015-04-11 NOTE — Telephone Encounter (Signed)
Gave [facility] the rx verbally , it had printed earlier when it was sent.

## 2015-04-11 NOTE — Progress Notes (Signed)
No problems noted in the recovery room. maw 

## 2015-04-11 NOTE — Progress Notes (Signed)
When going over discharge instructions with the pt and her husband, I asked is it possible if she could be pregnant.  Pt responded "yes".  I called Dr. Carlean Purl and he discontinue FLUCONAZOLE and ordered NYSTATIN.  I called [facility] out patient pharmacy and spoke with [facility] and told her Fluconazole was discontinued.  No complaints noted in the recovery room. maw

## 2015-04-11 NOTE — Patient Instructions (Addendum)
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 This can explain your symptoms. I took biopsies but will go ahead and start treatment anyway. I have prescribed fluconazole - if you could be pregnant please let me know and we will use something else.  Will call with results and follow-up.  I appreciate the opportunity to care for you. Gatha Mayer, MD, FACG    YOU HAD AN ENDOSCOPIC PROCEDURE TODAY AT [facility]:   Refer to the procedure report that was given to you for any specific questions about what was found during the examination.  If the procedure report does not answer your questions, please call your gastroenterologist to clarify.  If you requested that your care partner not be given the details of your procedure findings, then the procedure report has been included in a sealed envelope for you to review at your convenience later.  YOU SHOULD EXPECT: Some feelings of bloating in the abdomen. Passage of more gas than usual.  Walking can help get rid of the air that was put into your GI tract during the procedure and reduce the bloating. If you had a lower endoscopy (such as a colonoscopy or flexible sigmoidoscopy) you may notice spotting of blood in your stool or on the toilet paper. If you underwent a bowel prep for your procedure, you may not have a normal bowel movement for a few days.  Please Note:  You might notice some irritation and congestion in your nose or some drainage.  This is from the oxygen used during your procedure.  There is no need for concern and it should clear up in a day or so.  SYMPTOMS TO REPORT IMMEDIATELY:    Following upper endoscopy (EGD)  Vomiting of blood or coffee ground material  New chest pain or pain under the shoulder blades  Painful or persistently difficult swallowing  New shortness of breath  Fever of 100F or higher  Black, tarry-looking stools  For urgent or emergent issues, a gastroenterologist can be  reached at any hour by calling [phone].   DIET: Your first meal following the procedure should be a small meal and then it is ok to progress to your normal diet. Heavy or fried foods are harder to digest and may make you feel nauseous or bloated.  Likewise, meals heavy in dairy and vegetables can increase bloating.  Drink plenty of fluids but you should avoid alcoholic beverages for 24 hours.  ACTIVITY:  You should plan to take it easy for the rest of today and you should NOT DRIVE or use heavy machinery until tomorrow (because of the sedation medicines used during the test).    FOLLOW UP: Our staff will call the number listed on your records the next business day following your procedure to check on you and address any questions or concerns that you may have regarding the information given to you following your procedure. If we do not reach you, we will leave a message.  However, if you are feeling well and you are not experiencing any problems, there is no need to return our call.  We will assume that you have returned to your regular daily activities without incident.  If any biopsies were taken you will be contacted by phone or by letter within the next 1-3 weeks.  Please call us at [phone] if you have not heard about the biopsies in 3 weeks.    SIGNATURES/CONFIDENTIALITY: You and/or your care  partner have signed paperwork which will be entered into your electronic medical record.  These signatures attest to the fact that that the information above on your After Visit Summary has been reviewed and is understood.  Full responsibility of the confidentiality of this discharge information lies with you and/or your care-partner.    You may resume your current medications today. New rx was sent to your pharmacy by Dr. Carlean Purl for NYASTATIN. Await biopsy results. Please call if any questions or concerns.

## 2015-04-11 NOTE — Telephone Encounter (Signed)
Caller name: Caryl Pina (Pharmacist)  Relationship to patient:  Can be reached: [phone] Pharmacy: [facility], [place]   Reason for call: pt need a refill on her omeprazole Rx.

## 2015-04-11 NOTE — Progress Notes (Signed)
Report to PACU, RN, vss, BBS= Clear.  

## 2015-04-11 NOTE — Op Note (Addendum)
[place]  [address]. [place], [address]   ENDOSCOPY PROCEDURE REPORT  PATIENT: Melinda Kirk, Melinda Kirk  MR#: [id number] BIRTHDATE: 11-05-1970 , 72  yrs. old GENDER: female ENDOSCOPIST: Gatha Mayer, MD, [facility] PROCEDURE DATE:  04/11/2015 PROCEDURE:  EGD w/ biopsy ASA CLASS:     Class II INDICATIONS:  dysphagia. MEDICATIONS: Propofol 150 mg IV and Monitored anesthesia care TOPICAL ANESTHETIC: none  DESCRIPTION OF PROCEDURE: After the risks benefits and alternatives of the procedure were thoroughly explained, informed consent was obtained.  The LB [id number] endoscope was introduced through the mouth and advanced to the second portion of the duodenum , Without limitations.  The instrument was slowly withdrawn as the mucosa was fully examined.    1) Numerous adherent white plaques in proximal and mid-esophagus. Looks like Candida - biopsied. 2) Otherwise normal EGD. Retroflexed views revealed no abnormalities and Retroflexed views revealed .     The scope was then withdrawn from the patient and the procedure completed.  COMPLICATIONS: There were no immediate complications.  ENDOSCOPIC IMPRESSION: 1) Numerous adherent white plaques in proximal and mid-esophagus. Looks like Candida - biopsied. 2) Otherwise normal EGD  RECOMMENDATIONS: Start fluconazole 100 mg daily - Tx x 14 d     CHANGED TO NYSTATIN % ML QID - until she has menses on current cycle Had steroids in spring - ? related - is not immunosuppressed will call results and f/u sxs - long-standing sxs so not clear if all related - ? underlying functional dyspepsia    eSigned:  Gatha Mayer, MD, [facility] 04/11/2015 4:16 PM Revised: 04/11/2015 4:16 PM   [place]:POLID Charlett Blake, MD and The Patient

## 2015-04-11 NOTE — Progress Notes (Signed)
Called to room to assist during endoscopic procedure.  Patient ID and intended procedure confirmed with present staff. Received instructions for my participation in the procedure from the performing physician.  

## 2015-04-12 ENCOUNTER — Telehealth: Payer: Self-pay

## 2015-04-12 MED ORDER — OMEPRAZOLE 40 MG PO CPDR: 40.0000 mg | DELAYED_RELEASE_CAPSULE | Freq: Every day | ORAL | Status: DC

## 2015-04-12 NOTE — Telephone Encounter (Signed)
LOV and Labs- 01/26/15  Omeprazole 40mg  PO daily disp.30 ref. 1

## 2015-04-12 NOTE — Telephone Encounter (Signed)

## 2015-04-14 ENCOUNTER — Other Ambulatory Visit: Payer: Self-pay

## 2015-04-21 NOTE — Progress Notes (Signed)
Quick Note:  Call from office - biopsies confirmed Candida esophagitis  Ask how she is please? Re: dysphagia and esophageal sxs Started on Nystatin  No letter or recall from [place] ______

## 2015-04-24 ENCOUNTER — Telehealth: Payer: Self-pay | Admitting: *Deleted

## 2015-04-24 NOTE — Telephone Encounter (Signed)
Unable to reach patient at time of Pre-Visit Call.  Left message to confirm appointment.  

## 2015-04-25 ENCOUNTER — Ambulatory Visit (INDEPENDENT_AMBULATORY_CARE_PROVIDER_SITE_OTHER): Payer: 59 | Admitting: Family Medicine

## 2015-04-25 ENCOUNTER — Encounter: Payer: Self-pay | Admitting: Family Medicine

## 2015-04-25 VITALS — BP 106/62 | HR 79 | Temp 98.3°F | Ht 63.5 in | Wt 127.5 lb

## 2015-04-25 DIAGNOSIS — K219 Gastro-esophageal reflux disease without esophagitis: Secondary | ICD-10-CM

## 2015-04-25 DIAGNOSIS — N63 Unspecified lump in unspecified breast: Secondary | ICD-10-CM

## 2015-04-25 DIAGNOSIS — K224 Dyskinesia of esophagus: Secondary | ICD-10-CM | POA: Diagnosis not present

## 2015-04-25 DIAGNOSIS — E559 Vitamin D deficiency, unspecified: Secondary | ICD-10-CM | POA: Diagnosis not present

## 2015-04-25 DIAGNOSIS — E782 Mixed hyperlipidemia: Secondary | ICD-10-CM

## 2015-04-25 DIAGNOSIS — Z0000 Encounter for general adult medical examination without abnormal findings: Secondary | ICD-10-CM | POA: Diagnosis not present

## 2015-04-25 DIAGNOSIS — R921 Mammographic calcification found on diagnostic imaging of breast: Secondary | ICD-10-CM

## 2015-04-25 MED ORDER — HYOSCYAMINE SULFATE 0.125 MG SL SUBL: 0.1250 mg | SUBLINGUAL_TABLET | SUBLINGUAL | Status: DC | PRN

## 2015-04-25 NOTE — Patient Instructions (Addendum)
Sigg kefir, NOW probiotic daily at [employer] or locally [employer] or [facility] Candida Albicans blood test (IGG,IGM, IGA)   Preventive Care for Adults A healthy lifestyle and preventive care can promote health and wellness. Preventive health guidelines for women include the following key practices.  A routine yearly physical is a good way to check with your health care Latondra Gebhart about your health and preventive screening. It is a chance to share any concerns and updates on your health and to receive a thorough exam.  Visit your dentist for a routine exam and preventive care every 6 months. Brush your teeth twice a day and floss once a day. Good oral hygiene prevents tooth decay and gum disease.  The frequency of eye exams is based on your age, health, family medical history, use of contact lenses, and other factors. Follow your health care Shenice Dolder's recommendations for frequency of eye exams.  Eat a healthy diet. Foods like vegetables, fruits, whole grains, low-fat dairy products, and lean protein foods contain the nutrients you need without too many calories. Decrease your intake of foods high in solid fats, added sugars, and salt. Eat the right amount of calories for you.Get information about a proper diet from your health care Trishia Cuthrell, if necessary.  Regular physical exercise is one of the most important things you can do for your health. Most adults should get at least 150 minutes of moderate-intensity exercise (any activity that increases your heart rate and causes you to sweat) each week. In addition, most adults need muscle-strengthening exercises on 2 or more days a week.  Maintain a healthy weight. The body mass index (BMI) is a screening tool to identify possible weight problems. It provides an estimate of body fat based on height and weight. Your health care Marshella Tello can find your BMI and can help you achieve or maintain a healthy weight.For adults 20  years and older:  A BMI below 18.5 is considered underweight.  A BMI of 18.5 to 24.9 is normal.  A BMI of 25 to 29.9 is considered overweight.  A BMI of 30 and above is considered obese.  Maintain normal blood lipids and cholesterol levels by exercising and minimizing your intake of saturated fat. Eat a balanced diet with plenty of fruit and vegetables. Blood tests for lipids and cholesterol should begin at age 51 and be repeated every 5 years. If your lipid or cholesterol levels are high, you are over 50, or you are at high risk for heart disease, you may need your cholesterol levels checked more frequently.Ongoing high lipid and cholesterol levels should be treated with medicines if diet and exercise are not working.  If you smoke, find out from your health care Journii Nierman how to quit. If you do not use tobacco, do not start.  Lung cancer screening is recommended for adults aged 20-80 years who are at high risk for developing lung cancer because of a history of smoking. A yearly low-dose CT scan of the lungs is recommended for people who have at least a 30-pack-year history of smoking and are a current smoker or have quit within the past 15 years. A pack year of smoking is smoking an average of 1 pack of cigarettes a day for 1 year (for example: 1 pack a day for 30 years or 2 packs a day for 15 years). Yearly screening should continue until the smoker has stopped smoking for at least 15 years. Yearly screening should be stopped for people who develop a health  problem that would prevent them from having lung cancer treatment.  If you are pregnant, do not drink alcohol. If you are breastfeeding, be very cautious about drinking alcohol. If you are not pregnant and choose to drink alcohol, do not have more than 1 drink per day. One drink is considered to be 12 ounces (355 mL) of beer, 5 ounces (148 mL) of wine, or 1.5 ounces (44 mL) of liquor.  Avoid use of street drugs. Do not share needles with  anyone. Ask for help if you need support or instructions about stopping the use of drugs.  High blood pressure causes heart disease and increases the risk of stroke. Your blood pressure should be checked at least every 1 to 2 years. Ongoing high blood pressure should be treated with medicines if weight loss and exercise do not work.  If you are 36-89 years old, ask your health care Gurbani Figge if you should take aspirin to prevent strokes.  Diabetes screening involves taking a blood sample to check your fasting blood sugar level. This should be done once every 3 years, after age 87, if you are within normal weight and without risk factors for diabetes. Testing should be considered at a younger age or be carried out more frequently if you are overweight and have at least 1 risk factor for diabetes.  Breast cancer screening is essential preventive care for women. You should practice "breast self-awareness." This means understanding the normal appearance and feel of your breasts and may include breast self-examination. Any changes detected, no matter how small, should be reported to a health care Kelechi Astarita. Women in their 23s and 30s should have a clinical breast exam (CBE) by a health care Mariam Helbert as part of a regular health exam every 1 to 3 years. After age 74, women should have a CBE every year. Starting at age 3, women should consider having a mammogram (breast X-ray test) every year. Women who have a family history of breast cancer should talk to their health care Gianelle Mccaul about genetic screening. Women at a high risk of breast cancer should talk to their health care providers about having an MRI and a mammogram every year.  Breast cancer gene (BRCA)-related cancer risk assessment is recommended for women who have family members with BRCA-related cancers. BRCA-related cancers include breast, ovarian, tubal, and peritoneal cancers. Having family members with these cancers may be associated with an  increased risk for harmful changes (mutations) in the breast cancer genes BRCA1 and BRCA2. Results of the assessment will determine the need for genetic counseling and BRCA1 and BRCA2 testing.  Routine pelvic exams to screen for cancer are no longer recommended for nonpregnant women who are considered low risk for cancer of the pelvic organs (ovaries, uterus, and vagina) and who do not have symptoms. Ask your health care Akshaj Besancon if a screening pelvic exam is right for you.  If you have had past treatment for cervical cancer or a condition that could lead to cancer, you need Pap tests and screening for cancer for at least 20 years after your treatment. If Pap tests have been discontinued, your risk factors (such as having a new sexual partner) need to be reassessed to determine if screening should be resumed. Some women have medical problems that increase the chance of getting cervical cancer. In these cases, your health care Carlotta Telfair may recommend more frequent screening and Pap tests.  The HPV test is an additional test that may be used for cervical cancer screening. The HPV  test looks for the virus that can cause the cell changes on the cervix. The cells collected during the Pap test can be tested for HPV. The HPV test could be used to screen women aged 14 years and older, and should be used in women of any age who have unclear Pap test results. After the age of 7, women should have HPV testing at the same frequency as a Pap test.  Colorectal cancer can be detected and often prevented. Most routine colorectal cancer screening begins at the age of 32 years and continues through age 12 years. However, your health care Arnold Kester may recommend screening at an earlier age if you have risk factors for colon cancer. On a yearly basis, your health care Symphonie Schneiderman may provide home test kits to check for hidden blood in the stool. Use of a small camera at the end of a tube, to directly examine the colon  (sigmoidoscopy or colonoscopy), can detect the earliest forms of colorectal cancer. Talk to your health care Kippy Melena about this at age 88, when routine screening begins. Direct exam of the colon should be repeated every 5-10 years through age 52 years, unless early forms of pre-cancerous polyps or small growths are found.  People who are at an increased risk for hepatitis B should be screened for this virus. You are considered at high risk for hepatitis B if:  You were born in a country where hepatitis B occurs often. Talk with your health care Zakye Baby about which countries are considered high risk.  Your parents were born in a high-risk country and you have not received a shot to protect against hepatitis B (hepatitis B vaccine).  You have HIV or AIDS.  You use needles to inject street drugs.  You live with, or have sex with, someone who has hepatitis B.  You get hemodialysis treatment.  You take certain medicines for conditions like cancer, organ transplantation, and autoimmune conditions.  Hepatitis C blood testing is recommended for all people born from [birth year] through 1965 and any individual with known risks for hepatitis C.  Practice safe sex. Use condoms and avoid high-risk sexual practices to reduce the spread of sexually transmitted infections (STIs). STIs include gonorrhea, chlamydia, syphilis, trichomonas, herpes, HPV, and human immunodeficiency virus (HIV). Herpes, HIV, and HPV are viral illnesses that have no cure. They can result in disability, cancer, and death.  You should be screened for sexually transmitted illnesses (STIs) including gonorrhea and chlamydia if:  You are sexually active and are younger than 24 years.  You are older than 24 years and your health care Felisha Claytor tells you that you are at risk for this type of infection.  Your sexual activity has changed since you were last screened and you are at an increased risk for chlamydia or gonorrhea. Ask your health  care Yannis Gumbs if you are at risk.  If you are at risk of being infected with HIV, it is recommended that you take a prescription medicine daily to prevent HIV infection. This is called preexposure prophylaxis (PrEP). You are considered at risk if:  You are a heterosexual woman, are sexually active, and are at increased risk for HIV infection.  You take drugs by injection.  You are sexually active with a partner who has HIV.  Talk with your health care Temima Kutsch about whether you are at high risk of being infected with HIV. If you choose to begin PrEP, you should first be tested for HIV. You should then be tested every  3 months for as long as you are taking PrEP.  Osteoporosis is a disease in which the bones lose minerals and strength with aging. This can result in serious bone fractures or breaks. The risk of osteoporosis can be identified using a bone density scan. Women ages 46 years and over and women at risk for fractures or osteoporosis should discuss screening with their health care providers. Ask your health care Gaynel Schaafsma whether you should take a calcium supplement or vitamin D to reduce the rate of osteoporosis.  Menopause can be associated with physical symptoms and risks. Hormone replacement therapy is available to decrease symptoms and risks. You should talk to your health care Mardi Cannady about whether hormone replacement therapy is right for you.  Use sunscreen. Apply sunscreen liberally and repeatedly throughout the day. You should seek shade when your shadow is shorter than you. Protect yourself by wearing long sleeves, pants, a wide-brimmed hat, and sunglasses year round, whenever you are outdoors.  Once a month, do a whole body skin exam, using a mirror to look at the skin on your back. Tell your health care Kilian Schwartz of new moles, moles that have irregular borders, moles that are larger than a pencil eraser, or moles that have changed in shape or color.  Stay current with required  vaccines (immunizations).  Influenza vaccine. All adults should be immunized every year.  Tetanus, diphtheria, and acellular pertussis (Td, Tdap) vaccine. Pregnant women should receive 1 dose of Tdap vaccine during each pregnancy. The dose should be obtained regardless of the length of time since the last dose. Immunization is preferred during the 27th-36th week of gestation. An adult who has not previously received Tdap or who does not know her vaccine status should receive 1 dose of Tdap. This initial dose should be followed by tetanus and diphtheria toxoids (Td) booster doses every 10 years. Adults with an unknown or incomplete history of completing a 3-dose immunization series with Td-containing vaccines should begin or complete a primary immunization series including a Tdap dose. Adults should receive a Td booster every 10 years.  Varicella vaccine. An adult without evidence of immunity to varicella should receive 2 doses or a second dose if she has previously received 1 dose. Pregnant females who do not have evidence of immunity should receive the first dose after pregnancy. This first dose should be obtained before leaving the health care facility. The second dose should be obtained 4-8 weeks after the first dose.  Human papillomavirus (HPV) vaccine. Females aged 13-26 years who have not received the vaccine previously should obtain the 3-dose series. The vaccine is not recommended for use in pregnant females. However, pregnancy testing is not needed before receiving a dose. If a female is found to be pregnant after receiving a dose, no treatment is needed. In that case, the remaining doses should be delayed until after the pregnancy. Immunization is recommended for any person with an immunocompromised condition through the age of 82 years if she did not get any or all doses earlier. During the 3-dose series, the second dose should be obtained 4-8 weeks after the first dose. The third dose should be  obtained 24 weeks after the first dose and 16 weeks after the second dose.  Zoster vaccine. One dose is recommended for adults aged 68 years or older unless certain conditions are present.  Measles, mumps, and rubella (MMR) vaccine. Adults born before [birth year] generally are considered immune to measles and mumps. Adults born in [birth year] or later should have  1 or more doses of MMR vaccine unless there is a contraindication to the vaccine or there is laboratory evidence of immunity to each of the three diseases. A routine second dose of MMR vaccine should be obtained at least 28 days after the first dose for students attending postsecondary schools, health care workers, or international travelers. People who received inactivated measles vaccine or an unknown type of measles vaccine during 1963-1967 should receive 2 doses of MMR vaccine. People who received inactivated mumps vaccine or an unknown type of mumps vaccine before 1979 and are at high risk for mumps infection should consider immunization with 2 doses of MMR vaccine. For females of childbearing age, rubella immunity should be determined. If there is no evidence of immunity, females who are not pregnant should be vaccinated. If there is no evidence of immunity, females who are pregnant should delay immunization until after pregnancy. Unvaccinated health care workers born before [birth year] who lack laboratory evidence of measles, mumps, or rubella immunity or laboratory confirmation of disease should consider measles and mumps immunization with 2 doses of MMR vaccine or rubella immunization with 1 dose of MMR vaccine.  Pneumococcal 13-valent conjugate (PCV13) vaccine. When indicated, a person who is uncertain of her immunization history and has no record of immunization should receive the PCV13 vaccine. An adult aged 84 years or older who has certain medical conditions and has not been previously immunized should receive 1 dose of PCV13 vaccine. This PCV13 should be  followed with a dose of pneumococcal polysaccharide (PPSV23) vaccine. The PPSV23 vaccine dose should be obtained at least 8 weeks after the dose of PCV13 vaccine. An adult aged 37 years or older who has certain medical conditions and previously received 1 or more doses of PPSV23 vaccine should receive 1 dose of PCV13. The PCV13 vaccine dose should be obtained 1 or more years after the last PPSV23 vaccine dose.  Pneumococcal polysaccharide (PPSV23) vaccine. When PCV13 is also indicated, PCV13 should be obtained first. All adults aged 42 years and older should be immunized. An adult younger than age 48 years who has certain medical conditions should be immunized. Any person who resides in a nursing home or long-term care facility should be immunized. An adult smoker should be immunized. People with an immunocompromised condition and certain other conditions should receive both PCV13 and PPSV23 vaccines. People with human immunodeficiency virus (HIV) infection should be immunized as soon as possible after diagnosis. Immunization during chemotherapy or radiation therapy should be avoided. Routine use of PPSV23 vaccine is not recommended for American Indians, [place] Natives, or people younger than 65 years unless there are medical conditions that require PPSV23 vaccine. When indicated, people who have unknown immunization and have no record of immunization should receive PPSV23 vaccine. One-time revaccination 5 years after the first dose of PPSV23 is recommended for people aged 19-64 years who have chronic kidney failure, nephrotic syndrome, asplenia, or immunocompromised conditions. People who received 1-2 doses of PPSV23 before age 3 years should receive another dose of PPSV23 vaccine at age 54 years or later if at least 5 years have passed since the previous dose. Doses of PPSV23 are not needed for people immunized with PPSV23 at or after age 25 years.  Meningococcal vaccine. Adults with asplenia or persistent  complement component deficiencies should receive 2 doses of quadrivalent meningococcal conjugate (MenACWY-D) vaccine. The doses should be obtained at least 2 months apart. Microbiologists working with certain meningococcal bacteria, [employer] recruits, people at risk during an outbreak, and people  who travel to or live in countries with a high rate of meningitis should be immunized. A first-year college student up through age 68 years who is living in a residence hall should receive a dose if she did not receive a dose on or after her 16th birthday. Adults who have certain high-risk conditions should receive one or more doses of vaccine.  Hepatitis A vaccine. Adults who wish to be protected from this disease, have certain high-risk conditions, work with hepatitis A-infected animals, work in hepatitis A research labs, or travel to or work in countries with a high rate of hepatitis A should be immunized. Adults who were previously unvaccinated and who anticipate close contact with an international adoptee during the first 60 days after arrival in the [place] from a country with a high rate of hepatitis A should be immunized.  Hepatitis B vaccine. Adults who wish to be protected from this disease, have certain high-risk conditions, may be exposed to blood or other infectious body fluids, are household contacts or sex partners of hepatitis B positive people, are clients or workers in certain care facilities, or travel to or work in countries with a high rate of hepatitis B should be immunized.  Haemophilus influenzae type b (Hib) vaccine. A previously unvaccinated person with asplenia or sickle cell disease or having a scheduled splenectomy should receive 1 dose of Hib vaccine. Regardless of previous immunization, a recipient of a hematopoietic stem cell transplant should receive a 3-dose series 6-12 months after her successful transplant. Hib vaccine is not recommended for adults with HIV  infection. Preventive Services / Frequency Ages 52 to 19 years  Blood pressure check.** / Every 1 to 2 years.  Lipid and cholesterol check.** / Every 5 years beginning at age 12.  Clinical breast exam.** / Every 3 years for women in their 42s and 49s.  BRCA-related cancer risk assessment.** / For women who have family members with a BRCA-related cancer (breast, ovarian, tubal, or peritoneal cancers).  Pap test.** / Every 2 years from ages 44 through 45. Every 3 years starting at age 59 through age 57 or 55 with a history of 3 consecutive normal Pap tests.  HPV screening.** / Every 3 years from ages 57 through ages 73 to 41 with a history of 3 consecutive normal Pap tests.  Hepatitis C blood test.** / For any individual with known risks for hepatitis C.  Skin self-exam. / Monthly.  Influenza vaccine. / Every year.  Tetanus, diphtheria, and acellular pertussis (Tdap, Td) vaccine.** / Consult your health care Shey Bartmess. Pregnant women should receive 1 dose of Tdap vaccine during each pregnancy. 1 dose of Td every 10 years.  Varicella vaccine.** / Consult your health care Michelena Culmer. Pregnant females who do not have evidence of immunity should receive the first dose after pregnancy.  HPV vaccine. / 3 doses over 6 months, if 74 and younger. The vaccine is not recommended for use in pregnant females. However, pregnancy testing is not needed before receiving a dose.  Measles, mumps, rubella (MMR) vaccine.** / You need at least 1 dose of MMR if you were born in 1957 or later. You may also need a 2nd dose. For females of childbearing age, rubella immunity should be determined. If there is no evidence of immunity, females who are not pregnant should be vaccinated. If there is no evidence of immunity, females who are pregnant should delay immunization until after pregnancy.  Pneumococcal 13-valent conjugate (PCV13) vaccine.** / Consult your health care Perri Aragones.  Pneumococcal polysaccharide  (PPSV23) vaccine.** / 1 to 2 doses if you smoke cigarettes or if you have certain conditions.  Meningococcal vaccine.** / 1 dose if you are age 28 to 77 years and a Market researcher living in a residence hall, or have one of several medical conditions, you need to get vaccinated against meningococcal disease. You may also need additional booster doses.  Hepatitis A vaccine.** / Consult your health care Whitnie Deleon.  Hepatitis B vaccine.** / Consult your health care Hser Belanger.  Haemophilus influenzae type b (Hib) vaccine.** / Consult your health care Shyler Hamill. Ages 70 to 50 years  Blood pressure check.** / Every 1 to 2 years.  Lipid and cholesterol check.** / Every 5 years beginning at age 39 years.  Lung cancer screening. / Every year if you are aged 18-80 years and have a 30-pack-year history of smoking and currently smoke or have quit within the past 15 years. Yearly screening is stopped once you have quit smoking for at least 15 years or develop a health problem that would prevent you from having lung cancer treatment.  Clinical breast exam.** / Every year after age 52 years.  BRCA-related cancer risk assessment.** / For women who have family members with a BRCA-related cancer (breast, ovarian, tubal, or peritoneal cancers).  Mammogram.** / Every year beginning at age 77 years and continuing for as long as you are in good health. Consult with your health care Tenzin Pavon.  Pap test.** / Every 3 years starting at age 89 years through age 97 or 53 years with a history of 3 consecutive normal Pap tests.  HPV screening.** / Every 3 years from ages 49 years through ages 21 to 17 years with a history of 3 consecutive normal Pap tests.  Fecal occult blood test (FOBT) of stool. / Every year beginning at age 76 years and continuing until age 35 years. You may not need to do this test if you get a colonoscopy every 10 years.  Flexible sigmoidoscopy or colonoscopy.** / Every 5 years for a  flexible sigmoidoscopy or every 10 years for a colonoscopy beginning at age 73 years and continuing until age 93 years.  Hepatitis C blood test.** / For all people born from [birth year] through 1966 and any individual with known risks for hepatitis C.  Skin self-exam. / Monthly.  Influenza vaccine. / Every year.  Tetanus, diphtheria, and acellular pertussis (Tdap/Td) vaccine.** / Consult your health care Tyrek Lawhorn. Pregnant women should receive 1 dose of Tdap vaccine during each pregnancy. 1 dose of Td every 10 years.  Varicella vaccine.** / Consult your health care Dolph Tavano. Pregnant females who do not have evidence of immunity should receive the first dose after pregnancy.  Zoster vaccine.** / 1 dose for adults aged 65 years or older.  Measles, mumps, rubella (MMR) vaccine.** / You need at least 1 dose of MMR if you were born in 1957 or later. You may also need a 2nd dose. For females of childbearing age, rubella immunity should be determined. If there is no evidence of immunity, females who are not pregnant should be vaccinated. If there is no evidence of immunity, females who are pregnant should delay immunization until after pregnancy.  Pneumococcal 13-valent conjugate (PCV13) vaccine.** / Consult your health care Breyanna Valera.  Pneumococcal polysaccharide (PPSV23) vaccine.** / 1 to 2 doses if you smoke cigarettes or if you have certain conditions.  Meningococcal vaccine.** / Consult your health care Joyce Heitman.  Hepatitis A vaccine.** / Consult your health care Zaiyah Sottile.  Hepatitis  B vaccine.** / Consult your health care Giorgia Wahler.  Haemophilus influenzae type b (Hib) vaccine.** / Consult your health care Fuad Forget. Ages 79 years and over  Blood pressure check.** / Every 1 to 2 years.  Lipid and cholesterol check.** / Every 5 years beginning at age 59 years.  Lung cancer screening. / Every year if you are aged 49-80 years and have a 30-pack-year history of smoking and currently smoke or have  quit within the past 15 years. Yearly screening is stopped once you have quit smoking for at least 15 years or develop a health problem that would prevent you from having lung cancer treatment.  Clinical breast exam.** / Every year after age 90 years.  BRCA-related cancer risk assessment.** / For women who have family members with a BRCA-related cancer (breast, ovarian, tubal, or peritoneal cancers).  Mammogram.** / Every year beginning at age 14 years and continuing for as long as you are in good health. Consult with your health care Danajah Birdsell.  Pap test.** / Every 3 years starting at age 45 years through age 76 or 1 years with 3 consecutive normal Pap tests. Testing can be stopped between 65 and 70 years with 3 consecutive normal Pap tests and no abnormal Pap or HPV tests in the past 10 years.  HPV screening.** / Every 3 years from ages 59 years through ages 65 or 12 years with a history of 3 consecutive normal Pap tests. Testing can be stopped between 65 and 70 years with 3 consecutive normal Pap tests and no abnormal Pap or HPV tests in the past 10 years.  Fecal occult blood test (FOBT) of stool. / Every year beginning at age 80 years and continuing until age 1 years. You may not need to do this test if you get a colonoscopy every 10 years.  Flexible sigmoidoscopy or colonoscopy.** / Every 5 years for a flexible sigmoidoscopy or every 10 years for a colonoscopy beginning at age 29 years and continuing until age 4 years.  Hepatitis C blood test.** / For all people born from [birth year] through 1966 and any individual with known risks for hepatitis C.  Osteoporosis screening.** / A one-time screening for women ages 77 years and over and women at risk for fractures or osteoporosis.  Skin self-exam. / Monthly.  Influenza vaccine. / Every year.  Tetanus, diphtheria, and acellular pertussis (Tdap/Td) vaccine.** / 1 dose of Td every 10 years.  Varicella vaccine.** / Consult your health care  Greenly Rarick.  Zoster vaccine.** / 1 dose for adults aged 70 years or older.  Pneumococcal 13-valent conjugate (PCV13) vaccine.** / Consult your health care Fredy Gladu.  Pneumococcal polysaccharide (PPSV23) vaccine.** / 1 dose for all adults aged 9 years and older.  Meningococcal vaccine.** / Consult your health care Chakira Jachim.  Hepatitis A vaccine.** / Consult your health care Jmya Uliano.  Hepatitis B vaccine.** / Consult your health care Leonela Kivi.  Haemophilus influenzae type b (Hib) vaccine.** / Consult your health care Daneli Butkiewicz. ** Family history and personal history of risk and conditions may change your health care Mariem Skolnick's recommendations. Document Released: 09/10/2001 Document Revised: 11/29/2013 Document Reviewed: 12/10/2010 [facility] Patient Information 2015 [place], [place]. This information is not intended to replace advice given to you by your health care Sulema Braid. Make sure you discuss any questions you have with your health care Lucius Wise. ), at [facility] ask insurance if they will pay then ask Randell Loop what cash price

## 2015-04-25 NOTE — Progress Notes (Signed)
Pre visit review using our clinic review tool, if applicable. No additional management support is needed unless otherwise documented below in the visit note. 

## 2015-04-26 LAB — CBC
HEMATOCRIT: 35.7 % — AB (ref 36.0–46.0)
Hemoglobin: 12 g/dL (ref 12.0–15.0)
MCHC: 33.6 g/dL (ref 30.0–36.0)
MCV: 87.6 fl (ref 78.0–100.0)
PLATELETS: 195 10*3/uL (ref 150.0–400.0)
RBC: 4.07 Mil/uL (ref 3.87–5.11)
RDW: 12.7 % (ref 11.5–15.5)
WBC: 7.3 10*3/uL (ref 4.0–10.5)

## 2015-04-26 LAB — COMPREHENSIVE METABOLIC PANEL
ALBUMIN: 4.1 g/dL (ref 3.5–5.2)
ALK PHOS: 50 U/L (ref 39–117)
ALT: 13 U/L (ref 0–35)
AST: 17 U/L (ref 0–37)
BUN: 16 mg/dL (ref 6–23)
CHLORIDE: 105 meq/L (ref 96–112)
CO2: 31 mEq/L (ref 19–32)
Calcium: 9 mg/dL (ref 8.4–10.5)
Creatinine, Ser: 0.65 mg/dL (ref 0.40–1.20)
GFR: 104.59 mL/min (ref >60.00)
Glucose, Bld: 87 mg/dL (ref 70–99)
POTASSIUM: 4 meq/L (ref 3.5–5.1)
Sodium: 139 mEq/L (ref 135–145)
TOTAL PROTEIN: 6.9 g/dL (ref 6.0–8.3)
Total Bilirubin: 0.3 mg/dL (ref 0.2–1.2)

## 2015-04-26 LAB — SEDIMENTATION RATE: Sed Rate: 30 mm/hr — ABNORMAL HIGH (ref 0–22)

## 2015-04-26 LAB — VITAMIN D 25 HYDROXY (VIT D DEFICIENCY, FRACTURES): VITD: 49.03 ng/mL (ref 30.00–100.00)

## 2015-04-30 NOTE — Assessment & Plan Note (Signed)
Patient encouraged to maintain heart healthy diet, regular exercise, adequate sleep. Consider daily probiotics. Take medications as prescribed. Given and reviewed copy of ACP documents from [place] Secretary of State and encouraged to complete and return. Labs reviewed.  

## 2015-04-30 NOTE — Assessment & Plan Note (Signed)
Patient in need of surveillance MGM, is ordered today

## 2015-04-30 NOTE — Progress Notes (Signed)
Subjective:    Patient ID: Melinda Kirk, female    DOB: Jun 02, 1971, 44 y.o.   MRN: [id number]  Chief Complaint  Patient presents with  . Annual Exam    HPI Patient is in today for annual exam. She is struggling with abdominal discomfort and bloating. Has some dyspepsia. Denies fevers or chills or change in bowel habits. No other recent illness or acute concerns. Has been trying to maintain a heart healthy diet but has not been exercising regularly. Denies CP/palp/SOB/HA/congestion/fevers or GU c/o. Taking meds as prescribed  Past Medical History  Diagnosis Date  . GERD (gastroesophageal reflux disease)   . Breast calcification, left 07/29/04  . Thyroid nodule     s/p FNA- 4/09- benign  . Vitamin D deficiency 03/08/2011  . Headache(784.0) 01/03/2013  . Other and unspecified hyperlipidemia 02/08/2013  . Unspecified hypothyroidism 01/15/2011  . Preventative health care 01/22/2011  . Menorrhagia 03/07/2013  . Thyroid nodule 01/15/2011    H/o thyroid nodule   . Migraine headache with aura 01/03/2013    Past Surgical History  Procedure Laterality Date  . Biopsy thyroid  10/2007    benign biopsy of thyroid nodules.  Darden Dates without cardioversion N/A 02/10/2014    Procedure: TRANSESOPHAGEAL ECHOCARDIOGRAM (TEE);  Surgeon: [name], MD;  Location: [facility] ENDOSCOPY;  Service: Cardiovascular;  Laterality: N/A;    Family History  Problem Relation Age of Onset  . Stroke Mother   . Arthritis Mother   . Heart disease Father     CHF?    Social History   Social History  . Marital Status: Married    Spouse Name: N/A  . Number of Children: 3  . Years of Education: N/A   Occupational History  . Nurse    Social History Main Topics  . Smoking status: Former Smoker -- 5 years  . Smokeless tobacco: Never Used  . Alcohol Use: Yes     Comment: occasional  . Drug Use: No  . Sexual Activity: Not on file   Other Topics Concern  . Not on file   Social History Narrative   Regular  exercise:  No   Caffeine: 1-2 cups coffee / tea daily.   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    04/05/2015 last update       Outpatient Prescriptions Prior to Visit  Medication Sig Dispense Refill  . aspirin EC 81 MG tablet Take 81 mg by mouth daily.    . B Complex-Biotin-FA (VITAMIN B50 COMPLEX PO) Take 1 tablet by mouth daily.    . Calcium Carbonate-Vitamin D (CALTRATE 600+D PO) Take 1 tablet by mouth daily.    . ferrous sulfate 325 (65 FE) MG tablet Take 325 mg by mouth daily with breakfast.    . magnesium oxide (MAG-OX) 400 MG tablet Take 400 mg by mouth daily.    . Multiple Vitamins-Minerals (MULTIVITAMIN WITH MINERALS) tablet Take 1 tablet by mouth daily.    . naproxen sodium (ALEVE) 220 MG tablet Take 440 mg by mouth daily as needed (headache).    . nystatin (MYCOSTATIN) 100000 UNIT/ML suspension Take 5 mLs (500,000 Units total) by mouth 4 (four) times daily. Do not drink or eat for 30 mins after taking Use x 14 days 473 mL 0  . Omega-3 Fatty Acids (EQL OMEGA 3 FISH OIL) 1400 MG CAPS Take 1,400 mg by mouth daily.     [name] omeprazole (  PRILOSEC) 40 MG capsule Take 1 capsule (40 mg total) by mouth daily. 30 capsule 1  . Probiotic Product (PROBIOTIC ADVANCED) CAPS Take 1 capsule by mouth daily.    . ranitidine (ZANTAC) 300 MG tablet Take 1 tablet (300 mg total) by mouth at bedtime. 30 tablet 1  . sodium bicarbonate 325 MG tablet 1/2 tab po bid 30 tablet 3  . vitamin C (ASCORBIC ACID) 500 MG tablet Take 500 mg by mouth daily.    . vitamin E 400 UNIT capsule Take 400 Units by mouth daily.    . Cholecalciferol (VITAMIN D-3) 5000 UNITS TABS Take 2,000 Units by mouth daily.     . methocarbamol (ROBAXIN) 500 MG tablet Take 1 tablet (500 mg total) by mouth 3 (three) times daily. (Patient not taking: Reported on 04/11/2015) 60 tablet 0  . B Complex-C (SUPER B COMPLEX) TABS Take 1 tablet by mouth daily.     No facility-administered medications  prior to visit.    No Known Allergies  Review of Systems  Constitutional: Negative for fever, chills and malaise/fatigue.  HENT: Negative for congestion and hearing loss.   Eyes: Negative for discharge.  Respiratory: Negative for cough, sputum production and shortness of breath.   Cardiovascular: Negative for chest pain, palpitations and leg swelling.  Gastrointestinal: Positive for abdominal pain. Negative for heartburn, nausea, vomiting, diarrhea, constipation and blood in stool.  Genitourinary: Negative for dysuria, urgency, frequency and hematuria.  Musculoskeletal: Negative for myalgias, back pain and falls.  Skin: Negative for rash.  Neurological: Negative for dizziness, sensory change, loss of consciousness, weakness and headaches.  Endo/Heme/Allergies: Negative for environmental allergies. Does not bruise/bleed easily.  Psychiatric/Behavioral: Negative for depression and suicidal ideas. The patient is not nervous/anxious and does not have insomnia.        Objective:    Physical Exam  Constitutional: She is oriented to person, place, and time. She appears well-developed and well-nourished. No distress.  HENT:  Head: Normocephalic and atraumatic.  Eyes: Conjunctivae are normal.  Neck: Neck supple. No thyromegaly present.  Cardiovascular: Normal rate, regular rhythm and normal heart sounds.   No murmur heard. Pulmonary/Chest: Effort normal and breath sounds normal. No respiratory distress.  Abdominal: Soft. Bowel sounds are normal. She exhibits no distension and no mass. There is no tenderness.  Musculoskeletal: She exhibits no edema.  Lymphadenopathy:    She has no cervical adenopathy.  Neurological: She is alert and oriented to person, place, and time.  Skin: Skin is warm and dry.  Psychiatric: She has a normal mood and affect. Her behavior is normal.    BP 106/62 mmHg  Pulse 79  Temp(Src) 98.3 F (36.8 C) (Oral)  Ht 5' 3.5" (1.613 m)  Wt 127 lb 8 oz (57.834 kg)   BMI 22.23 kg/m2  SpO2 97%  LMP 03/23/2015 Wt Readings from Last 3 Encounters:  04/25/15 127 lb 8 oz (57.834 kg)  04/11/15 125 lb (56.7 kg)  04/05/15 125 lb 9.6 oz (56.972 kg)     Lab Results  Component Value Date   WBC 7.3 04/25/2015   HGB 12.0 04/25/2015   HCT 35.7* 04/25/2015   PLT 195.0 04/25/2015   GLUCOSE 87 04/25/2015   CHOL 186 01/26/2015   TRIG 77.0 01/26/2015   HDL 60.00 01/26/2015   LDLCALC 111* 01/26/2015   ALT 13 04/25/2015   AST 17 04/25/2015   NA 139 04/25/2015   K 4.0 04/25/2015   CL 105 04/25/2015   CREATININE 0.65 04/25/2015   BUN 16  04/25/2015   CO2 31 04/25/2015   TSH 0.44 01/26/2015   INR 0.9 02/04/2014    Lab Results  Component Value Date   TSH 0.44 01/26/2015   Lab Results  Component Value Date   WBC 7.3 04/25/2015   HGB 12.0 04/25/2015   HCT 35.7* 04/25/2015   MCV 87.6 04/25/2015   PLT 195.0 04/25/2015   Lab Results  Component Value Date   NA 139 04/25/2015   K 4.0 04/25/2015   CO2 31 04/25/2015   GLUCOSE 87 04/25/2015   BUN 16 04/25/2015   CREATININE 0.65 04/25/2015   BILITOT 0.3 04/25/2015   ALKPHOS 50 04/25/2015   AST 17 04/25/2015   ALT 13 04/25/2015   PROT 6.9 04/25/2015   ALBUMIN 4.1 04/25/2015   CALCIUM 9.0 04/25/2015   GFR 104.59 04/25/2015   Lab Results  Component Value Date   CHOL 186 01/26/2015   Lab Results  Component Value Date   HDL 60.00 01/26/2015   Lab Results  Component Value Date   LDLCALC 111* 01/26/2015   Lab Results  Component Value Date   TRIG 77.0 01/26/2015   Lab Results  Component Value Date   CHOLHDL 3 01/26/2015   No results found for: HGBA1C     Assessment & Plan:   Problem List Items Addressed This Visit    Vitamin D deficiency    Resolved with supplementation      Relevant Orders   Vitamin D (25 hydroxy) (Completed)   CBC (Completed)   Sed Rate (ESR) (Completed)   Comp Met (CMET) (Completed)   Preventative health care - Primary    Patient encouraged to maintain heart  healthy diet, regular exercise, adequate sleep. Consider daily probiotics. Take medications as prescribed. Given and reviewed copy of ACP documents from [employer] and encouraged to complete and return. Labs reviewed      Relevant Orders   Vitamin D (25 hydroxy) (Completed)   CBC (Completed)   Sed Rate (ESR) (Completed)   Comp Met (CMET) (Completed)   Hyperlipidemia, mixed    Encouraged heart healthy diet, increase exercise, avoid trans fats, consider a krill oil cap daily      GERD (gastroesophageal reflux disease)    Avoid offending foods, start probiotics. Do not eat large meals in late evening and consider raising head of bed.       Relevant Medications   hyoscyamine (LEVSIN SL) 0.125 MG SL tablet   Breast calcification, left    Patient in need of surveillance MGM, is ordered today       Other Visit Diagnoses    Breast mass in female        Relevant Orders    MM Digital Diagnostic Bilat    Vitamin D (25 hydroxy) (Completed)    CBC (Completed)    Sed Rate (ESR) (Completed)    Comp Met (CMET) (Completed)    Esophageal spasm        Relevant Medications    hyoscyamine (LEVSIN SL) 0.125 MG SL tablet    Other Relevant Orders    Vitamin D (25 hydroxy) (Completed)    CBC (Completed)    Sed Rate (ESR) (Completed)    Comp Met (CMET) (Completed)       I have discontinued Ms. Cech's Vitamin D-3 and SUPER B COMPLEX. I am also having her start on hyoscyamine. Additionally, I am having her maintain her Calcium Carbonate-Vitamin D (CALTRATE 600+D PO), aspirin EC, ferrous sulfate, vitamin C, EQL OMEGA 3 FISH  OIL, vitamin E, naproxen sodium, multivitamin with minerals, methocarbamol, sodium bicarbonate, ranitidine, B Complex-Biotin-FA (VITAMIN B50 COMPLEX PO), magnesium oxide, PROBIOTIC ADVANCED, omeprazole, Turmeric, and Collagen.  Meds ordered this encounter  Medications  . Turmeric 450 MG CAPS    Sig: Take by mouth daily.  . Collagen 500 MG CAPS    Sig: Take by  mouth daily.  . hyoscyamine (LEVSIN SL) 0.125 MG SL tablet    Sig: Place 1 tablet (0.125 mg total) under the tongue every 4 (four) hours as needed.    Dispense:  30 tablet    Refill:  1     [name], MD

## 2015-04-30 NOTE — Assessment & Plan Note (Signed)
Resolved with supplementation 

## 2015-04-30 NOTE — Assessment & Plan Note (Signed)
Encouraged heart healthy diet, increase exercise, avoid trans fats, consider a krill oil cap daily 

## 2015-04-30 NOTE — Assessment & Plan Note (Signed)
Avoid offending foods, start probiotics. Do not eat large meals in late evening and consider raising head of bed.  

## 2015-05-15 ENCOUNTER — Other Ambulatory Visit: Payer: Self-pay | Admitting: Family Medicine

## 2015-05-15 DIAGNOSIS — N63 Unspecified lump in unspecified breast: Secondary | ICD-10-CM

## 2015-05-25 ENCOUNTER — Ambulatory Visit
Admission: RE | Admit: 2015-05-25 | Discharge: 2015-05-25 | Disposition: A | Discharge status: Home / Self Care | Payer: 59 | Source: Ambulatory Visit | Attending: Family Medicine | Admitting: Family Medicine

## 2015-05-25 ENCOUNTER — Other Ambulatory Visit: Payer: Self-pay | Admitting: Family Medicine

## 2015-05-25 DIAGNOSIS — N63 Unspecified lump in unspecified breast: Secondary | ICD-10-CM

## 2015-06-20 ENCOUNTER — Other Ambulatory Visit: Payer: Self-pay | Admitting: Family Medicine

## 2015-06-27 ENCOUNTER — Telehealth: Payer: Self-pay | Admitting: Family Medicine

## 2015-06-27 ENCOUNTER — Ambulatory Visit: Payer: 59 | Admitting: Family Medicine

## 2015-07-04 ENCOUNTER — Ambulatory Visit (INDEPENDENT_AMBULATORY_CARE_PROVIDER_SITE_OTHER): Payer: 59 | Admitting: Family Medicine

## 2015-07-04 ENCOUNTER — Encounter: Payer: Self-pay | Admitting: Family Medicine

## 2015-07-04 ENCOUNTER — Other Ambulatory Visit (HOSPITAL_COMMUNITY)
Admission: RE | Admit: 2015-07-04 | Discharge: 2015-07-04 | Disposition: A | Discharge status: Home / Self Care | Payer: 59 | Source: Ambulatory Visit | Attending: Family Medicine | Admitting: Family Medicine

## 2015-07-04 VITALS — BP 102/72 | HR 70 | Temp 98.5°F | Ht 64.0 in | Wt 127.1 lb

## 2015-07-04 DIAGNOSIS — K219 Gastro-esophageal reflux disease without esophagitis: Secondary | ICD-10-CM | POA: Diagnosis not present

## 2015-07-04 DIAGNOSIS — E782 Mixed hyperlipidemia: Secondary | ICD-10-CM

## 2015-07-04 DIAGNOSIS — N760 Acute vaginitis: Secondary | ICD-10-CM | POA: Insufficient documentation

## 2015-07-04 DIAGNOSIS — D649 Anemia, unspecified: Secondary | ICD-10-CM

## 2015-07-04 DIAGNOSIS — Z113 Encounter for screening for infections with a predominantly sexual mode of transmission: Secondary | ICD-10-CM | POA: Insufficient documentation

## 2015-07-04 DIAGNOSIS — Z124 Encounter for screening for malignant neoplasm of cervix: Secondary | ICD-10-CM | POA: Diagnosis not present

## 2015-07-04 DIAGNOSIS — Z01419 Encounter for gynecological examination (general) (routine) without abnormal findings: Secondary | ICD-10-CM | POA: Insufficient documentation

## 2015-07-04 NOTE — Patient Instructions (Signed)
Anemia, Nonspecific Anemia is a condition in which the concentration of red blood cells or hemoglobin in the blood is below normal. Hemoglobin is a substance in red blood cells that carries oxygen to the tissues of the body. Anemia results in not enough oxygen reaching these tissues.  CAUSES  Common causes of anemia include:   Excessive bleeding. Bleeding may be internal or external. This includes excessive bleeding from periods (in women) or from the intestine.   Poor nutrition.   Chronic kidney, thyroid, and liver disease.  Bone marrow disorders that decrease red blood cell production.  Cancer and treatments for cancer.  HIV, AIDS, and their treatments.  Spleen problems that increase red blood cell destruction.  Blood disorders.  Excess destruction of red blood cells due to infection, medicines, and autoimmune disorders. SIGNS AND SYMPTOMS   Minor weakness.   Dizziness.   Headache.  Palpitations.   Shortness of breath, especially with exercise.   Paleness.  Cold sensitivity.  Indigestion.  Nausea.  Difficulty sleeping.  Difficulty concentrating. Symptoms may occur suddenly or they may develop slowly.  DIAGNOSIS  Additional blood tests are often needed. These help your health care provider determine the best treatment. Your health care provider will check your stool for blood and look for other causes of blood loss.  TREATMENT  Treatment varies depending on the cause of the anemia. Treatment can include:   Supplements of iron, vitamin B12, or folic acid.   Hormone medicines.   A blood transfusion. This may be needed if blood loss is severe.   Hospitalization. This may be needed if there is significant continual blood loss.   Dietary changes.  Spleen removal. HOME CARE INSTRUCTIONS Keep all follow-up appointments. It often takes many weeks to correct anemia, and having your health care provider check on your condition and your response to  treatment is very important. SEEK IMMEDIATE MEDICAL CARE IF:   You develop extreme weakness, shortness of breath, or chest pain.   You become dizzy or have trouble concentrating.  You develop heavy vaginal bleeding.   You develop a rash.   You have bloody or black, tarry stools.   You faint.   You vomit up blood.   You vomit repeatedly.   You have abdominal pain.  You have a fever or persistent symptoms for more than 2-3 days.   You have a fever and your symptoms suddenly get worse.   You are dehydrated.  MAKE SURE YOU:  Understand these instructions.  Will watch your condition.  Will get help right away if you are not doing well or get worse.   This information is not intended to replace advice given to you by your health care provider. Make sure you discuss any questions you have with your health care provider.   Document Released: 08/22/2004 Document Revised: 03/17/2013 Document Reviewed: 01/08/2013 Elsevier Interactive Patient Education 2016 Elsevier Inc[employer]  

## 2015-07-04 NOTE — Progress Notes (Signed)
Pre visit review using our clinic review tool, if applicable. No additional management support is needed unless otherwise documented below in the visit note. 

## 2015-07-05 LAB — URINE CYTOLOGY ANCILLARY ONLY
CHLAMYDIA, DNA PROBE: NEGATIVE
NEISSERIA GONORRHEA: NEGATIVE
TRICH (WINDOWPATH): NEGATIVE

## 2015-07-05 LAB — CYTOLOGY - PAP

## 2015-07-05 NOTE — Telephone Encounter (Signed)
Pt was no show 06/27/15 1:15pm appt for follow up, pt came in 07/04/15, charge or no charge?

## 2015-07-05 NOTE — Telephone Encounter (Signed)
No she came in

## 2015-07-06 ENCOUNTER — Encounter: Payer: Self-pay | Admitting: Family Medicine

## 2015-07-07 LAB — CERVICOVAGINAL ANCILLARY ONLY
BACTERIAL VAGINITIS: NEGATIVE
CANDIDA VAGINITIS: NEGATIVE

## 2015-07-08 ENCOUNTER — Encounter: Payer: Self-pay | Admitting: Family Medicine

## 2015-07-08 DIAGNOSIS — D649 Anemia, unspecified: Secondary | ICD-10-CM

## 2015-07-08 HISTORY — DX: Anemia, unspecified: D64.9

## 2015-07-08 NOTE — Assessment & Plan Note (Addendum)
Pap today, significant vaginal discharge. Ancillary testing is negative for any concerns.

## 2015-07-08 NOTE — Assessment & Plan Note (Signed)
Avoid offending foods, start probiotics. Do not eat large meals in late evening and consider raising head of bed.  

## 2015-07-08 NOTE — Assessment & Plan Note (Signed)
Increase leafy greens, consider increased lean red meat and using cast iron cookware. Continue to monitor, report any concerns 

## 2015-07-08 NOTE — Progress Notes (Signed)
Subjective:    Patient ID: Melinda Kirk, female    DOB: 1970/12/01, 44 y.o.   MRN: [id number]  Chief Complaint  Patient presents with  . Gynecologic Exam    HPI Patient is in today for Pap smear and follow-up. Feeling well. Has been noting increased vaginal bleeding with cycles and longer cycles. Cycles used to be 3-4 days and are now more like 7. 3 days are heavy changing pads 4-5 times a day. No fevers or chills and no other acute complaints noted today. Denies CP/palp/SOB/HA/congestion/fevers/GI or GU c/o. Taking meds as prescribed  Past Medical History  Diagnosis Date  . GERD (gastroesophageal reflux disease)   . Breast calcification, left 07/29/04  . Thyroid nodule     s/p FNA- 4/09- benign  . Vitamin D deficiency 03/08/2011  . Headache(784.0) 01/03/2013  . Other and unspecified hyperlipidemia 02/08/2013  . Unspecified hypothyroidism 01/15/2011  . Preventative health care 01/22/2011  . Menorrhagia 03/07/2013  . Thyroid nodule 01/15/2011    H/o thyroid nodule   . Migraine headache with aura 01/03/2013  . Anemia 07/08/2015    Past Surgical History  Procedure Laterality Date  . Biopsy thyroid  10/2007    benign biopsy of thyroid nodules.  Darden Dates without cardioversion N/A 02/10/2014    Procedure: TRANSESOPHAGEAL ECHOCARDIOGRAM (TEE);  Surgeon: [name], MD;  Location: [facility] ENDOSCOPY;  Service: Cardiovascular;  Laterality: N/A;    Family History  Problem Relation Age of Onset  . Stroke Mother   . Arthritis Mother   . Heart disease Father     CHF?    Social History   Social History  . Marital Status: Married    Spouse Name: N/A  . Number of Children: 3  . Years of Education: N/A   Occupational History  . Nurse    Social History Main Topics  . Smoking status: Former Smoker -- 5 years  . Smokeless tobacco: Never Used  . Alcohol Use: Yes     Comment: occasional  . Drug Use: No  . Sexual Activity: Not on file   Other Topics Concern  . Not on file   Social  History Narrative   Regular exercise:  No   Caffeine: 1-2 cups coffee / tea daily.   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    04/05/2015 last update       Outpatient Prescriptions Prior to Visit  Medication Sig Dispense Refill  . aspirin EC 81 MG tablet Take 81 mg by mouth daily.    . B Complex-Biotin-FA (VITAMIN B50 COMPLEX PO) Take 1 tablet by mouth daily.    . Calcium Carbonate-Vitamin D (CALTRATE 600+D PO) Take 1 tablet by mouth daily.    . Collagen 500 MG CAPS Take by mouth daily.    . ferrous sulfate 325 (65 FE) MG tablet Take 325 mg by mouth daily with breakfast.    . hyoscyamine (LEVSIN SL) 0.125 MG SL tablet Place 1 tablet (0.125 mg total) under the tongue every 4 (four) hours as needed. 30 tablet 1  . magnesium oxide (MAG-OX) 400 MG tablet Take 400 mg by mouth daily.    . methocarbamol (ROBAXIN) 500 MG tablet Take 1 tablet (500 mg total) by mouth 3 (three) times daily. 60 tablet 0  . Multiple Vitamins-Minerals (MULTIVITAMIN WITH MINERALS) tablet Take 1 tablet by mouth daily.    . naproxen sodium (ALEVE) 220 MG tablet Take  440 mg by mouth daily as needed (headache).    . Omega-3 Fatty Acids (EQL OMEGA 3 FISH OIL) 1400 MG CAPS Take 1,400 mg by mouth daily.     [name] omeprazole (PRILOSEC) 40 MG capsule TAKE 1 CAPSULE BY MOUTH DAILY. 30 capsule 4  . Probiotic Product (PROBIOTIC ADVANCED) CAPS Take 1 capsule by mouth daily.    . ranitidine (ZANTAC) 300 MG tablet Take 1 tablet (300 mg total) by mouth at bedtime. 30 tablet 1  . Turmeric 450 MG CAPS Take by mouth daily.    . vitamin C (ASCORBIC ACID) 500 MG tablet Take 500 mg by mouth daily.    . vitamin E 400 UNIT capsule Take 400 Units by mouth daily.    . sodium bicarbonate 325 MG tablet 1/2 tab po bid (Patient not taking: Reported on 07/04/2015) 30 tablet 3   No facility-administered medications prior to visit.    No Known Allergies  Review of Systems  Constitutional: Negative for  fever and malaise/fatigue.  HENT: Negative for congestion.   Eyes: Negative for discharge.  Respiratory: Negative for shortness of breath.   Cardiovascular: Negative for chest pain, palpitations and leg swelling.  Gastrointestinal: Negative for nausea and abdominal pain.  Genitourinary: Negative for dysuria.  Musculoskeletal: Negative for falls.  Skin: Negative for rash.  Neurological: Negative for loss of consciousness and headaches.  Endo/Heme/Allergies: Negative for environmental allergies.  Psychiatric/Behavioral: Negative for depression. The patient is not nervous/anxious.        Objective:    Physical Exam  Constitutional: She is oriented to person, place, and time. She appears well-developed and well-nourished. No distress.  HENT:  Head: Normocephalic and atraumatic.  Eyes: Conjunctivae are normal.  Neck: Neck supple. No thyromegaly present.  Cardiovascular: Normal rate, regular rhythm and normal heart sounds.   No murmur heard. Pulmonary/Chest: Effort normal and breath sounds normal. No respiratory distress.  Abdominal: Soft. Bowel sounds are normal. She exhibits no distension and no mass. There is no tenderness.  Genitourinary: Uterus normal. Vaginal discharge found.  Musculoskeletal: She exhibits no edema.  Lymphadenopathy:    She has no cervical adenopathy.  Neurological: She is alert and oriented to person, place, and time.  Skin: Skin is warm and dry.  Psychiatric: She has a normal mood and affect. Her behavior is normal.    BP 102/72 mmHg  Pulse 70  Temp(Src) 98.5 F (36.9 C) (Oral)  Ht 5\' 4"  (1.626 m)  Wt 127 lb 2 oz (57.664 kg)  BMI 21.81 kg/m2  SpO2 96% Wt Readings from Last 3 Encounters:  07/04/15 127 lb 2 oz (57.664 kg)  04/25/15 127 lb 8 oz (57.834 kg)  04/11/15 125 lb (56.7 kg)     Lab Results  Component Value Date   WBC 7.3 04/25/2015   HGB 12.0 04/25/2015   HCT 35.7* 04/25/2015   PLT 195.0 04/25/2015   GLUCOSE 87 04/25/2015   CHOL 186  01/26/2015   TRIG 77.0 01/26/2015   HDL 60.00 01/26/2015   LDLCALC 111* 01/26/2015   ALT 13 04/25/2015   AST 17 04/25/2015   NA 139 04/25/2015   K 4.0 04/25/2015   CL 105 04/25/2015   CREATININE 0.65 04/25/2015   BUN 16 04/25/2015   CO2 31 04/25/2015   TSH 0.44 01/26/2015   INR 0.9 02/04/2014    Lab Results  Component Value Date   TSH 0.44 01/26/2015   Lab Results  Component Value Date   WBC 7.3 04/25/2015   HGB 12.0 04/25/2015  HCT 35.7* 04/25/2015   MCV 87.6 04/25/2015   PLT 195.0 04/25/2015   Lab Results  Component Value Date   NA 139 04/25/2015   K 4.0 04/25/2015   CO2 31 04/25/2015   GLUCOSE 87 04/25/2015   BUN 16 04/25/2015   CREATININE 0.65 04/25/2015   BILITOT 0.3 04/25/2015   ALKPHOS 50 04/25/2015   AST 17 04/25/2015   ALT 13 04/25/2015   PROT 6.9 04/25/2015   ALBUMIN 4.1 04/25/2015   CALCIUM 9.0 04/25/2015   GFR 104.59 04/25/2015   Lab Results  Component Value Date   CHOL 186 01/26/2015   Lab Results  Component Value Date   HDL 60.00 01/26/2015   Lab Results  Component Value Date   LDLCALC 111* 01/26/2015   Lab Results  Component Value Date   TRIG 77.0 01/26/2015   Lab Results  Component Value Date   CHOLHDL 3 01/26/2015   No results found for: HGBA1C     Assessment & Plan:   Problem List Items Addressed This Visit    Anemia    Increase leafy greens, consider increased lean red meat and using cast iron cookware. Continue to monitor, report any concerns      Cervical cancer screening - Primary    Pap today, significant vaginal discharge. Ancillary testing is negative for any concerns.      Relevant Orders   Cytology - PAP (Completed)   Urine cytology ancillary only (Completed)   GERD (gastroesophageal reflux disease)    Avoid offending foods, start probiotics. Do not eat large meals in late evening and consider raising head of bed.       Hyperlipidemia, mixed    Encouraged heart healthy diet, increase exercise, avoid  trans fats, consider a krill oil cap daily       Other Visit Diagnoses    Vaginitis and vulvovaginitis        Relevant Orders    Urine cytology ancillary only (Completed)       I am having Ms. Rooker maintain her Calcium Carbonate-Vitamin D (CALTRATE 600+D PO), aspirin EC, ferrous sulfate, vitamin C, EQL OMEGA 3 FISH OIL, vitamin E, naproxen sodium, multivitamin with minerals, methocarbamol, sodium bicarbonate, ranitidine, B Complex-Biotin-FA (VITAMIN B50 COMPLEX PO), magnesium oxide, PROBIOTIC ADVANCED, Turmeric, Collagen, hyoscyamine, omeprazole, and Cholecalciferol.  Meds ordered this encounter  Medications  . Cholecalciferol 2000 UNITS CAPS    Sig: Take by mouth daily.     [name], MD

## 2015-07-08 NOTE — Assessment & Plan Note (Signed)
Encouraged heart healthy diet, increase exercise, avoid trans fats, consider a krill oil cap daily 

## 2015-07-17 ENCOUNTER — Other Ambulatory Visit: Payer: Self-pay | Admitting: *Deleted

## 2015-07-17 NOTE — Patient Outreach (Signed)
[facility]) Care Management  07/17/2015  Melinda Kirk Oct 14, 1970 [id number]   UM[employer] member contact the [facility] office inquiring on a benefit exception completed earlier this year. Based upon the requesting information RN attempted to contact pt today with this update however pt not available and RN only able to leave a voice message requesting a call back. Will update pt accordingly concerning her concerns of the benefit exception approved.   Raina Mina, RN Care Management Coordinator [facility] Main Office [phone]

## 2015-07-18 ENCOUNTER — Other Ambulatory Visit: Payer: Self-pay | Admitting: *Deleted

## 2015-07-18 NOTE — Patient Outreach (Signed)
[facility]) Care Management  07/18/2015  Melinda Kirk 02-09-1971 [id number]   RN attempedt to reach pt to update on her requested benefit exception however only able to leave a voice message requesting a call back to provide pt the requested information concerning coverage. Will await call and continue another outreach attempt this week.  Raina Mina, RN Care Management Coordinator [facility] Main Office [phone]

## 2015-07-19 ENCOUNTER — Other Ambulatory Visit: Payer: Self-pay | Admitting: *Deleted

## 2015-07-19 NOTE — Patient Outreach (Signed)
[facility]) Care Management  07/19/2015  COVE IGLEHEART 12-18-1970 [id number]   RN spoke with pt today in detail concerning her approved benefit exception concerning 60% on physician charges and fee and 80% on the specified admission for [facility] on 01/23/14. RN encouraged pt to review her UMR Explanation of benefits form and contact the [place] office at the number noted for any discrepancies. Pt verbalized an understanding and grateful for the follow up and information provided. Case will be closed once again.  Raina Mina, RN Care Management Coordinator [facility] Main Office [phone]

## 2015-10-18 MED FILL — OMEPRAZOLE DR 40 MG CAPSULE: 40 | 60 days supply | Qty: 60 | Fill #1

## 2016-01-02 ENCOUNTER — Other Ambulatory Visit: Payer: Self-pay | Admitting: Family Medicine

## 2016-01-02 MED FILL — OMEPRAZOLE DR 40 MG CAPSULE: 40 | 30 days supply | Qty: 30 | Fill #0

## 2016-02-23 DIAGNOSIS — F458 Other somatoform disorders: Secondary | ICD-10-CM | POA: Diagnosis not present

## 2016-02-23 DIAGNOSIS — R0789 Other chest pain: Secondary | ICD-10-CM | POA: Diagnosis not present

## 2016-02-23 DIAGNOSIS — R29818 Other symptoms and signs involving the nervous system: Secondary | ICD-10-CM | POA: Diagnosis not present

## 2016-02-23 DIAGNOSIS — R0602 Shortness of breath: Secondary | ICD-10-CM | POA: Diagnosis not present

## 2016-02-28 ENCOUNTER — Telehealth: Payer: Self-pay | Admitting: *Deleted

## 2016-02-28 NOTE — Telephone Encounter (Signed)
[employer] note received via fax  Call:   Date: 02/27/16 Time: 1748   Caller: Self Return number: [phone], [phone]  Nurse: Susanne Greenhouse, RN  Chief Complaint: Breathing-Shortness of Breath or sounds breathless  Reason for call: Caller states BP was 168/105 was feeling dizzy and short of breath, took a xanax and lisinopril and BP 135/82, dizziness and shortness of breath. Was in [place] now and has been [place] for 2 weeks. Is pale of finger tips last Friday was in ED tingling of finger tips.  Related visit to physician within the last 2 weeks: Yes  Guideline: High Blood Pressure; BP #160/100 AND cardiac or neurologic symptoms (e.g., chest pain, difficulty breathing, unsteady gait, blurred vision)  Disposition:  Go to ED Now  **Pt seen in ED at [facility] 02/27/16, notes in Care Everywhere**

## 2016-03-01 ENCOUNTER — Ambulatory Visit (INDEPENDENT_AMBULATORY_CARE_PROVIDER_SITE_OTHER): Payer: 59 | Admitting: Family

## 2016-03-01 ENCOUNTER — Encounter: Payer: Self-pay | Admitting: Family

## 2016-03-01 VITALS — BP 122/74 | HR 77 | Temp 97.9°F

## 2016-03-01 DIAGNOSIS — R000 Tachycardia, unspecified: Secondary | ICD-10-CM | POA: Diagnosis not present

## 2016-03-01 DIAGNOSIS — Q2112 Patent foramen ovale: Secondary | ICD-10-CM

## 2016-03-01 DIAGNOSIS — Q211 Atrial septal defect: Secondary | ICD-10-CM

## 2016-03-01 MED ORDER — MECLIZINE HCL 25 MG PO TABS: 25.0000 mg | ORAL_TABLET | Freq: Three times a day (TID) | ORAL | 0 refills | Status: DC | PRN

## 2016-03-01 MED FILL — OMEPRAZOLE DR 40 MG CAPSULE: 40 | 90 days supply | Qty: 90 | Fill #1

## 2016-03-01 NOTE — Progress Notes (Signed)
Subjective:    Patient ID: Melinda Kirk, female    DOB: 04/21/1971, 45 y.o.   MRN: [id number]  HPI  Melinda Kirk is a 45 yr old female who presents today for ER follow up.  She apparently developed SOB while on vacation at [facility].  She went to the ER.  On admission BP was reportedly very high (237/130) and she was hypoxic (oxygen 82%).  She ended up leaving AM.   She went [place] up over 12000 feet on a gondola.  [place] she got [place] she felt ok, but on the way down she developed shortness of breath.  She saw the first aid team. HR was 120-130's and BP 237/130.  Saturation was only 82%.  BP 237/130, fingers were pale and tongue was tingling.  Declined ambulance (signed ama papers) and instead Went to the ER by car at [facility]. Was diagnosed with acute hyperventilation.  She was told EKG, blood work OK, had ABG which was ok.  She also was given ativan.  She then returned home.  The next day she had some mild dizziness which occurred after BM HR went up to [id number], BP [id number] systolic. She did have a chest x ray and a CT head and a D dimer which was unremarkable.    Today she reports ongoing dizziness.    Review of Systems See HPI  Past Medical History:  Diagnosis Date  . Anemia 07/08/2015  . Breast calcification, left 07/29/04  . GERD (gastroesophageal reflux disease)   . Headache(784.0) 01/03/2013  . Menorrhagia 03/07/2013  . Migraine headache with aura 01/03/2013  . Other and unspecified hyperlipidemia 02/08/2013  . Preventative health care 01/22/2011  . Thyroid nodule    s/p FNA- 4/09- benign  . Thyroid nodule 01/15/2011   H/o thyroid nodule   . Unspecified hypothyroidism 01/15/2011  . Vitamin D deficiency 03/08/2011     Social History   Social History  . Marital status: Married    Spouse name: N/A  . Number of children: 3  . Years of education: N/A   Occupational History  . Nurse    Social History Main Topics  . Smoking status: Former Smoker    Years: 5.00  .  Smokeless tobacco: Never Used  . Alcohol use Yes     Comment: occasional  . Drug use: No  . Sexual activity: Not on file   Other Topics Concern  . Not on file   Social History Narrative   Regular exercise:  No   Caffeine: 1-2 cups coffee / tea daily.   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    04/05/2015 last update       Past Surgical History:  Procedure Laterality Date  . BIOPSY THYROID  10/2007   benign biopsy of thyroid nodules.  . TEE WITHOUT CARDIOVERSION N/A 02/10/2014   Procedure: TRANSESOPHAGEAL ECHOCARDIOGRAM (TEE);  Surgeon: [name], MD;  Location: [facility] ENDOSCOPY;  Service: Cardiovascular;  Laterality: N/A;    Family History  Problem Relation Age of Onset  . Stroke Mother   . Arthritis Mother   . Heart disease Father     CHF?    No Known Allergies  Current Outpatient Prescriptions on File Prior to Visit  Medication Sig Dispense Refill  . aspirin EC 81 MG tablet Take 81 mg by mouth daily.    . B Complex-Biotin-FA (VITAMIN B50  COMPLEX PO) Take 1 tablet by mouth daily.    . Calcium Carbonate-Vitamin D (CALTRATE 600+D PO) Take 1 tablet by mouth daily.    . Cholecalciferol 2000 UNITS CAPS Take by mouth daily.    . Collagen 500 MG CAPS Take by mouth daily.    . ferrous sulfate 325 (65 FE) MG tablet Take 325 mg by mouth daily with breakfast.    . hyoscyamine (LEVSIN SL) 0.125 MG SL tablet Place 1 tablet (0.125 mg total) under the tongue every 4 (four) hours as needed. 30 tablet 1  . magnesium oxide (MAG-OX) 400 MG tablet Take 400 mg by mouth daily.    . methocarbamol (ROBAXIN) 500 MG tablet Take 1 tablet (500 mg total) by mouth 3 (three) times daily. 60 tablet 0  . Multiple Vitamins-Minerals (MULTIVITAMIN WITH MINERALS) tablet Take 1 tablet by mouth daily.    . naproxen sodium (ALEVE) 220 MG tablet Take 440 mg by mouth daily as needed (headache).    . Omega-3 Fatty Acids (EQL OMEGA 3 FISH OIL) 1400 MG CAPS Take  1,400 mg by mouth daily.     [name] omeprazole (PRILOSEC) 40 MG capsule TAKE 1 CAPSULE BY MOUTH DAILY. 30 capsule 4  . Probiotic Product (PROBIOTIC ADVANCED) CAPS Take 1 capsule by mouth daily.    . ranitidine (ZANTAC) 300 MG tablet Take 1 tablet (300 mg total) by mouth at bedtime. 30 tablet 1  . sodium bicarbonate 325 MG tablet 1/2 tab po bid 30 tablet 3  . Turmeric 450 MG CAPS Take by mouth daily.    . vitamin C (ASCORBIC ACID) 500 MG tablet Take 500 mg by mouth daily.    . vitamin E 400 UNIT capsule Take 400 Units by mouth daily.     No current facility-administered medications on file prior to visit.     BP 122/74   Pulse 77   Temp 97.9 F (36.6 C) (Oral)   LMP 11/30/2015   SpO2 98% Comment: room air      Objective:   Physical Exam  Constitutional: She is oriented to person, place, and time. She appears well-developed and well-nourished.  HENT:  Head: Normocephalic and atraumatic.  Cardiovascular: Normal rate, regular rhythm and normal heart sounds.   No murmur heard. Pulmonary/Chest: Effort normal and breath sounds normal. No respiratory distress. She has no wheezes.  Neurological: She is alert and oriented to person, place, and time.  Skin: Skin is warm and dry.  Psychiatric: She has a normal mood and affect. Her behavior is normal. Judgment and thought content normal.          Assessment & Plan:  EKG tracing is personally reviewed.  EKG notes NSR.  No acute changes.  SOB/Tachycardia-  I suspect that this was related to the elevation when she was in [place].  She has had an extensive work up in the ER.  I will repeat her echo. She already has an appointment scheduled with her cardiologist (Dr. Burt Knack).  Advised her to keep this appointment.  She is concerned that her PFO is contributing to these issues.  Will defer further work up to cardiology.  We have requested her records from her ED admission in [place].

## 2016-03-01 NOTE — Patient Instructions (Addendum)
We will work on getting your records from [place]. We will arrange an appointment with Dr. Burt Knack and scheduling your echo. For dizziness, you may try over the counter meclizine.  Please call if new/worsening symptoms or if symptoms do not continue to improve.

## 2016-03-01 NOTE — Progress Notes (Signed)
Pre visit review using our clinic review tool, if applicable. No additional management support is needed unless otherwise documented below in the visit note. 

## 2016-03-05 ENCOUNTER — Encounter (HOSPITAL_COMMUNITY): Payer: Self-pay | Admitting: Vascular Surgery

## 2016-03-05 ENCOUNTER — Emergency Department (HOSPITAL_COMMUNITY): Payer: 59

## 2016-03-05 ENCOUNTER — Emergency Department (HOSPITAL_COMMUNITY)
Admission: EM | Admit: 2016-03-05 | Discharge: 2016-03-05 | Disposition: A | Discharge status: Home / Self Care | Payer: 59 | Attending: Emergency Medicine | Admitting: Emergency Medicine

## 2016-03-05 DIAGNOSIS — R42 Dizziness and giddiness: Secondary | ICD-10-CM | POA: Diagnosis not present

## 2016-03-05 DIAGNOSIS — R0602 Shortness of breath: Secondary | ICD-10-CM | POA: Insufficient documentation

## 2016-03-05 DIAGNOSIS — Z87891 Personal history of nicotine dependence: Secondary | ICD-10-CM | POA: Insufficient documentation

## 2016-03-05 DIAGNOSIS — Z8673 Personal history of transient ischemic attack (TIA), and cerebral infarction without residual deficits: Secondary | ICD-10-CM | POA: Insufficient documentation

## 2016-03-05 DIAGNOSIS — E039 Hypothyroidism, unspecified: Secondary | ICD-10-CM | POA: Insufficient documentation

## 2016-03-05 DIAGNOSIS — Z7982 Long term (current) use of aspirin: Secondary | ICD-10-CM | POA: Insufficient documentation

## 2016-03-05 LAB — BASIC METABOLIC PANEL
Anion gap: 8 (ref 5–15)
BUN: 10 mg/dL (ref 6–20)
CHLORIDE: 102 mmol/L (ref 101–111)
CO2: 25 mmol/L (ref 22–32)
CREATININE: 0.52 mg/dL (ref 0.44–1.00)
Calcium: 9.7 mg/dL (ref 8.9–10.3)
GFR calc Af Amer: >60 mL/min (ref >60)
GFR calc non Af Amer: >60 mL/min (ref >60)
GLUCOSE: 88 mg/dL (ref 65–99)
Potassium: 3.7 mmol/L (ref 3.5–5.1)
Sodium: 135 mmol/L (ref 135–145)

## 2016-03-05 LAB — CBC
HCT: 43.7 % (ref 36.0–46.0)
Hemoglobin: 14.2 g/dL (ref 12.0–15.0)
MCH: 29.3 pg (ref 26.0–34.0)
MCHC: 32.5 g/dL (ref 30.0–36.0)
MCV: 90.3 fL (ref 78.0–100.0)
PLATELETS: 216 10*3/uL (ref 150–400)
RBC: 4.84 MIL/uL (ref 3.87–5.11)
RDW: 12.4 % (ref 11.5–15.5)
WBC: 6.6 10*3/uL (ref 4.0–10.5)

## 2016-03-05 LAB — I-STAT TROPONIN, ED: Troponin i, poc: 0 ng/mL (ref 0.00–0.08)

## 2016-03-05 NOTE — ED Notes (Signed)
Ambulated Pt. Initial SpO2 was 99% and pulse 80 bpm. Pt's SpO2 fluctuated between 99-100% and pulse increased to 91 bpm. Pt's final SpO2 was 100% and pulse 83 bpm. Pt reported feeling "good".

## 2016-03-05 NOTE — ED Triage Notes (Signed)
Pt reports to the ED for eval of dizziness. She also reports that with the dizziness her nail beds became white and she was SOB. She has hx of PFO and wants to see Dr. Burt Knack. She reports she has been having problems with this since she went on vacation to [place] which was very [place]. Pt A&Ox4, resp e/u at this time, and skin warm and dry.

## 2016-03-05 NOTE — ED Provider Notes (Signed)
[facility] DEPT Provider Note   CSN: [id number] Arrival date & time: 03/05/16  1504  First Provider Contact:  None       History   Chief Complaint Chief Complaint  Patient presents with  . Shortness of Breath  . Dizziness    HPI Melinda Kirk is a 45 y.o. female.  Patient with history of PFO, nursing background -- presents with ongoing intermittent episodes of lightheadedness with associated shortness of breath over the past one month. When these occur, she sees her nailbeds turn white. Symptoms started while she was in [place] at high altitude. She had an episode that was improved with oxygen. She went to an emergency department there and had multiple tests performed, including CT scan of her head, d-dimer and ABG. These were reportedly negative. Patient was treated with anxiolytics and discharged. She had another emergency department visit that did not result in further workup. Patient has continued to have symptoms. She has seen her PCP who has ordered repeat echocardiogram on 03/12/16. She is due to see her cardiologist on 03/13/16. She feels generally weak and has not been able to work because she feels like she gets more short of breath with exertion. She is concerned that this is related to her PFO and is requesting echocardiogram here.       Past Medical History:  Diagnosis Date  . Anemia 07/08/2015  . Breast calcification, left 07/29/04  . GERD (gastroesophageal reflux disease)   . Headache(784.0) 01/03/2013  . Menorrhagia 03/07/2013  . Migraine headache with aura 01/03/2013  . Other and unspecified hyperlipidemia 02/08/2013  . Preventative health care 01/22/2011  . Thyroid nodule    s/p FNA- 4/09- benign  . Thyroid nodule 01/15/2011   H/o thyroid nodule   . Unspecified hypothyroidism 01/15/2011  . Vitamin D deficiency 03/08/2011    Patient Active Problem List   Diagnosis Date Noted  . Anemia 07/08/2015  . Cervical cancer screening 07/04/2015  . Dysphagia 04/05/2015    . Low back pain 11/21/2014  . Muscle spasm of back 04/11/2014  . CVA (cerebral infarction) 01/27/2014  . Patent foramen ovale with right to left shunt 01/23/2014  . Menorrhagia 03/07/2013  . Hyperlipidemia, mixed 02/08/2013  . GERD (gastroesophageal reflux disease) 02/08/2013  . Migraine headache with aura 01/03/2013  . Conjunctival hemorrhage of left eye 09/23/2011  . Vitamin D deficiency 03/08/2011  . Preventative health care 01/22/2011  . 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 Thyroid nodule 01/15/2011  . Nevus 01/15/2011  . Breast calcification, left 01/15/2011    Past Surgical History:  Procedure Laterality Date  . BIOPSY THYROID  10/2007   benign biopsy of thyroid nodules.  . TEE WITHOUT CARDIOVERSION N/A 02/10/2014   Procedure: TRANSESOPHAGEAL ECHOCARDIOGRAM (TEE);  Surgeon: [name], MD;  Location: [facility] ENDOSCOPY;  Service: Cardiovascular;  Laterality: N/A;    OB History    No data available       Home Medications    Prior to Admission medications   Medication Sig Start Date End Date Taking? Authorizing Provider  aspirin EC 81 MG tablet Take 81 mg by mouth daily.    Historical Provider, MD  B Complex-Biotin-FA (VITAMIN B50 COMPLEX PO) Take 1 tablet by mouth daily.    Historical Provider, MD  Calcium Carbonate-Vitamin D (CALTRATE 600+D PO) Take 1 tablet by mouth daily.    Historical Provider, MD  Cholecalciferol 2000 UNITS CAPS Take by mouth daily.    Historical Provider, MD  Collagen 500  MG CAPS Take by mouth daily.    Historical Provider, MD  ferrous sulfate 325 (65 FE) MG tablet Take 325 mg by mouth daily with breakfast.    Historical Provider, MD  hyoscyamine (LEVSIN SL) 0.125 MG SL tablet Place 1 tablet (0.125 mg total) under the tongue every 4 (four) hours as needed. 04/25/15   Mosie Lukes, MD  magnesium oxide (MAG-OX) 400 MG tablet Take 400 mg by mouth daily.    Historical Provider, MD  meclizine (ANTIVERT) 25 MG tablet Take 1 tablet (25  mg total) by mouth 3 (three) times daily as needed for dizziness. 03/01/16   Debbrah Alar, NP  methocarbamol (ROBAXIN) 500 MG tablet Take 1 tablet (500 mg total) by mouth 3 (three) times daily. 04/11/14   Brunetta Jeans, PA-C  Multiple Vitamins-Minerals (MULTIVITAMIN WITH MINERALS) tablet Take 1 tablet by mouth daily.    Historical Provider, MD  naproxen sodium (ALEVE) 220 MG tablet Take 440 mg by mouth daily as needed (headache).    Historical Provider, MD  Omega-3 Fatty Acids (EQL OMEGA 3 FISH OIL) 1400 MG CAPS Take 1,400 mg by mouth daily.     Historical Provider, MD  omeprazole (PRILOSEC) 40 MG capsule TAKE 1 CAPSULE BY MOUTH DAILY. 01/02/16   Mosie Lukes, MD  Probiotic Product (PROBIOTIC ADVANCED) CAPS Take 1 capsule by mouth daily.    Historical Provider, MD  ranitidine (ZANTAC) 300 MG tablet Take 1 tablet (300 mg total) by mouth at bedtime. 01/26/15   Mosie Lukes, MD  sodium bicarbonate 325 MG tablet 1/2 tab po bid 01/26/15   Mosie Lukes, MD  Turmeric 450 MG CAPS Take by mouth daily.    Historical Provider, MD  vitamin C (ASCORBIC ACID) 500 MG tablet Take 500 mg by mouth daily.    Historical Provider, MD  vitamin E 400 UNIT capsule Take 400 Units by mouth daily.    Historical Provider, MD    Family History Family History  Problem Relation Age of Onset  . Stroke Mother   . Arthritis Mother   . Heart disease Father     CHF?    Social History Social History  Substance Use Topics  . Smoking status: Former Smoker    Years: 5.00  . Smokeless tobacco: Never Used  . Alcohol use Yes     Comment: occasional     Allergies   Review of patient's allergies indicates no known allergies.   Review of Systems Review of Systems  Constitutional: Negative for diaphoresis and fever.  Eyes: Negative for redness.  Respiratory: Positive for shortness of breath. Negative for cough.   Cardiovascular: Negative for chest pain, palpitations and leg swelling.  Gastrointestinal: Negative  for abdominal pain, nausea and vomiting.  Genitourinary: Negative for dysuria.  Musculoskeletal: Negative for back pain and neck pain.  Skin: Negative for rash.  Neurological: Positive for light-headedness. Negative for syncope.  Psychiatric/Behavioral: The patient is nervous/anxious.      Physical Exam Updated Vital Signs BP 115/76   Pulse 69   Temp 98 F (36.7 C) (Oral)   Resp 17   LMP 11/30/2015   SpO2 98%   Physical Exam  Constitutional: She appears well-developed and well-nourished.  HENT:  Head: Normocephalic and atraumatic.  Mouth/Throat: Mucous membranes are normal. Mucous membranes are not dry.  Eyes: Conjunctivae are normal.  Neck: Trachea normal and normal range of motion. Neck supple. Normal carotid pulses and no JVD present. No muscular tenderness present. Carotid bruit is not  present. No tracheal deviation present.  Cardiovascular: Normal rate, regular rhythm, S1 normal, S2 normal, normal heart sounds and intact distal pulses.  Exam reveals no decreased pulses.   No murmur heard. Pulmonary/Chest: Effort normal. No respiratory distress. She has no wheezes. She exhibits no tenderness.  Abdominal: Soft. Normal aorta and bowel sounds are normal. There is no tenderness. There is no rebound and no guarding.  Musculoskeletal: Normal range of motion.  Neurological: She is alert.  Skin: Skin is warm and dry. She is not diaphoretic. No cyanosis. No pallor.  Psychiatric: Her mood appears anxious.  Nursing note and vitals reviewed.    ED Treatments / Results  Labs (all labs ordered are listed, but only abnormal results are displayed) Labs Reviewed  [place], ED    ED ECG REPORT   Date: 03/05/2016  Rate: 78  Rhythm: normal sinus rhythm  QRS Axis: normal  Intervals: normal  ST/T Wave abnormalities: normal  Conduction Disutrbances:none  Narrative Interpretation:   Old EKG Reviewed: none available  I have personally reviewed  the EKG tracing and agree with the computerized printout as noted.  Radiology Dg Chest 2 View  Result Date: 03/05/2016 CLINICAL DATA:  Shortness of breath for 1 week, history GERD, former smoker EXAM: CHEST  2 VIEW COMPARISON:  07/19/2013 FINDINGS: Normal heart size, mediastinal contours, and pulmonary vascularity. Lungs clear. No pleural effusion or pneumothorax. Bones unremarkable. IMPRESSION: Normal exam. Electronically Signed   By: Lavonia Dana M.D.   On: 03/05/2016 15:40    Procedures Procedures (including critical care time)  Medications Ordered in ED Medications - No data to display   Initial Impression / Assessment and Plan / ED Course  I have reviewed the triage vital signs and the nursing notes.  Pertinent labs & imaging results that were available during my care of the patient were reviewed by me and considered in my medical decision making (see chart for details).  Discussed with Dr. Lita Mains.   Spoke with on-call cardiology Dr. Debara Pickett. Agrees no need for emergent ECHO. Encourages follow-up as planned.   Patient updated and she understands. She is comfortable with discharge to home.  Patient was counseled to return with severe chest pain, especially if the pain is crushing or pressure-like and spreads to the arms, back, neck, or jaw, or if they have sweating, nausea, or shortness of breath with the pain. They were encouraged to call 911 with these symptoms.   They were also told to return if their chest pain gets worse and does not go away with rest, they have an attack of chest pain lasting longer than usual despite rest and treatment with the medications their caregiver has prescribed, if they wake from sleep with chest pain or shortness of breath, if they feel dizzy or faint, if they have chest pain not typical of their usual pain, or if they have any other emergent concerns regarding their health.  The patient verbalized understanding and agreed.    Final Clinical  Impressions(s) / ED Diagnoses   Final diagnoses:  Episodic lightheadedness  Shortness of breath   Patients with shortness of breath and lightheadedness as described above. Patient has had previous workup at outside hospital. Repeat testing today is reassuring. EKG shows normal sinus rhythm in the 70s. No indications for admission, emergent echocardiogram, or emergent cardiology consultation. Patient has appropriate outpatient workup. Return instructions as above. No concerning clinical symptoms for PE. No murmur noted on exam. Possible anxiety component.  New Prescriptions Discharge Medication List as of 03/05/2016  8:35 PM       Carlisle Cater, PA-C 03/05/16 2209    Julianne Rice, MD 03/07/16 [phone]

## 2016-03-05 NOTE — Discharge Instructions (Signed)
Please read and follow all provided instructions.  Your diagnoses today include:  1. Episodic lightheadedness   2. Shortness of breath     Tests performed today include:  An EKG of your heart  A chest x-ray  Cardiac enzymes - a blood test for heart muscle damage  Blood counts and electrolytes  Vital signs. See below for your results today.   Medications prescribed:   None  Take any prescribed medications only as directed.  Follow-up instructions: Please follow-up with your primary care provider and cardiologist as planned.    Return instructions:  SEEK IMMEDIATE MEDICAL ATTENTION IF:  You have severe chest pain, especially if the pain is crushing or pressure-like and spreads to the arms, back, neck, or jaw, or if you have sweating, nausea (feeling sick to your stomach), or shortness of breath. THIS IS AN EMERGENCY. Don't wait to see if the pain will go away. Get medical help at once. Call 911 or 0 (operator). DO NOT drive yourself to the hospital.   Your chest pain gets worse and does not go away with rest.   You have an attack of chest pain lasting longer than usual, despite rest and treatment with the medications your caregiver has prescribed.   You wake from sleep with chest pain or shortness of breath.  You have a fainting spell.  You have chest pain not typical of your usual pain for which you originally saw your caregiver.   You have any other emergent concerns regarding your health.  Additional Information: Chest pain comes from many different causes. Your caregiver has diagnosed you as having chest pain that is not specific for one problem, but does not require admission.  You are at low risk for an acute heart condition or other serious illness.   Your vital signs today were: BP 115/76    Pulse 69    Temp 98 F (36.7 C) (Oral)    Resp 17    LMP 11/30/2015    SpO2 98%  If your blood pressure (BP) was elevated above 135/85 this visit, please have this  repeated by your doctor within one month. --------------

## 2016-03-12 ENCOUNTER — Ambulatory Visit (HOSPITAL_COMMUNITY)
Admission: RE | Admit: 2016-03-12 | Discharge: 2016-03-12 | Disposition: A | Discharge status: Home / Self Care | Payer: 59 | Source: Ambulatory Visit | Attending: Family | Admitting: Family

## 2016-03-12 DIAGNOSIS — Q2112 Patent foramen ovale: Secondary | ICD-10-CM

## 2016-03-12 DIAGNOSIS — Q211 Atrial septal defect: Secondary | ICD-10-CM | POA: Diagnosis not present

## 2016-03-12 DIAGNOSIS — I340 Nonrheumatic mitral (valve) insufficiency: Secondary | ICD-10-CM | POA: Insufficient documentation

## 2016-03-12 DIAGNOSIS — R0602 Shortness of breath: Secondary | ICD-10-CM | POA: Diagnosis present

## 2016-03-12 NOTE — Progress Notes (Signed)
Echocardiogram 2D Echocardiogram has been performed.  Melinda Kirk 03/12/2016, 1:57 PM

## 2016-03-13 ENCOUNTER — Encounter: Payer: Self-pay | Admitting: Cardiology

## 2016-03-13 ENCOUNTER — Ambulatory Visit (INDEPENDENT_AMBULATORY_CARE_PROVIDER_SITE_OTHER): Payer: 59 | Admitting: Cardiology

## 2016-03-13 ENCOUNTER — Other Ambulatory Visit (HOSPITAL_BASED_OUTPATIENT_CLINIC_OR_DEPARTMENT_OTHER): Payer: 59

## 2016-03-13 VITALS — BP 110/70 | HR 82 | Ht 63.5 in | Wt 132.0 lb

## 2016-03-13 DIAGNOSIS — R0600 Dyspnea, unspecified: Secondary | ICD-10-CM | POA: Diagnosis not present

## 2016-03-13 DIAGNOSIS — R002 Palpitations: Secondary | ICD-10-CM

## 2016-03-13 DIAGNOSIS — R42 Dizziness and giddiness: Secondary | ICD-10-CM | POA: Diagnosis not present

## 2016-03-13 LAB — TSH: TSH: 0.89 mIU/L

## 2016-03-13 NOTE — Progress Notes (Signed)
03/13/2016 [facility]   1970/12/20  [id number]  Primary Physician [name], MD Primary Cardiologist: Dr. Burt Knack   Reason for Visit/CC: Dyspnea and High Blood Pressure  HPI:  45 y/o female, followed by Dr. Burt Knack, who presents to clinic today for follow-up. 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  She has a h/o TIA in 2015. She had presented to [facility] on January 22, 2014 with sudden onset of left arm tingling, facial numbness, and dysarthria and underwent inpatient stroke evaluation. This included a hypercoagulable panel, an echocardiogram, CT of the brain, CTA, and MRI. She has no traditional risk factors for stroke. Pertinent findings included interatrial septal aneurysm and strongly positive agitated saline study with right-to-left intracardiac shunting. CTA demonstrated no evidence of extracranial arterial disease. 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 Cardiology evaluation was recommended for consideration of PFO closure. She underwent TEE by Dr. Debara Pickett 02/10/14. She was found to have a small PFO with very few microbubbles crossing from right to left with provocation. Given the small size and intermittent nature of the PFO, closure was not recommended. There was questions as to whether her symptoms may have been complicated migraine versus TIA. LVEF was normal by TEE at 55-60%. No atrial or ventricular thrombi or masses.   She has not been seen in our office since 2015. She presents back with a complaint of dyspnea and palpitations. Recently, she was vacationing at [facility]. She was at higher elevations and spending a great deal outdoors. It was at this time that she developed an episode of acute onset of dyspnea. She also had palpitations and felt lightheaded and dizzy. Her nail beds were "white". EMT was called and on arrival she was noted to  be hypoxic with O2 sats in the 80s. This improved with supplemental oxygen. It was also felt that she was hyperventilating, complicating matters. She was taken to the ED there and had a normal workup, including negative CT scan of her head, d-dimer and ABG.  Since returning from her vacation, she has continued to have intermittent episodes of tachy palpitations that occur off and on. This leads to mild dyspnea. She denies any chest discomfort. She also develops symptoms of lightheadedness/near-syncope when she bears down and have a bowel movement, likely vasovagal. She was seen in the [facility] ED for another episode of dyspnea and dizziness 03/05/16 and had a negative w/u.     Current Outpatient Prescriptions  Medication Sig Dispense Refill  . aspirin EC 81 MG tablet Take 81 mg by mouth daily.    . B Complex-Biotin-FA (VITAMIN B50 COMPLEX PO) Take 1 tablet by mouth daily.    . Calcium Carbonate-Vitamin D (CALTRATE 600+D PO) Take 1 tablet by mouth daily.    . Cholecalciferol 2000 UNITS CAPS Take by mouth daily.    . Collagen 500 MG CAPS Take by mouth daily.    . ferrous sulfate 325 (65 FE) MG tablet Take 325 mg by mouth daily with breakfast.    . hyoscyamine (LEVSIN SL) 0.125 MG SL tablet Place 1 tablet (0.125 mg total) under the tongue every 4 (four) hours as needed. 30 tablet 1  . magnesium oxide (MAG-OX) 400 MG tablet Take 400 mg by mouth daily.    . meclizine (ANTIVERT) 25 MG tablet Take 1 tablet (25 mg total) by mouth 3 (three) times daily as needed  for dizziness. 30 tablet 0  . methocarbamol (ROBAXIN) 500 MG tablet Take 1 tablet (500 mg total) by mouth 3 (three) times daily. 60 tablet 0  . Multiple Vitamins-Minerals (MULTIVITAMIN WITH MINERALS) tablet Take 1 tablet by mouth daily.    . naproxen sodium (ALEVE) 220 MG tablet Take 440 mg by mouth daily as needed (headache).    . Omega-3 Fatty Acids (EQL OMEGA 3 FISH OIL) 1400 MG CAPS Take 1,400 mg by mouth daily.     [name] omeprazole (PRILOSEC) 40 MG  capsule TAKE 1 CAPSULE BY MOUTH DAILY. 30 capsule 4  . Probiotic Product (PROBIOTIC ADVANCED) CAPS Take 1 capsule by mouth daily.    . ranitidine (ZANTAC) 300 MG tablet Take 1 tablet (300 mg total) by mouth at bedtime. 30 tablet 1  . sodium bicarbonate 325 MG tablet 1/2 tab po bid 30 tablet 3  . Turmeric 450 MG CAPS Take by mouth daily.    . vitamin C (ASCORBIC ACID) 500 MG tablet Take 500 mg by mouth daily.    . vitamin E 400 UNIT capsule Take 400 Units by mouth daily.     No current facility-administered medications for this visit.     No Known Allergies  Social History   Social History  . Marital status: Married    Spouse name: N/A  . Number of children: 3  . Years of education: N/A   Occupational History  . Nurse    Social History Main Topics  . Smoking status: Former Smoker    Years: 5.00  . Smokeless tobacco: Never Used  . Alcohol use Yes     Comment: occasional  . Drug use: No  . Sexual activity: Not on file   Other Topics Concern  . Not on file   Social History Narrative   Regular exercise:  No   Caffeine: 1-2 cups coffee / tea daily.   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    04/05/2015 last update        Review of Systems: General: negative for chills, fever, night sweats or weight changes.  Cardiovascular: negative for chest pain, dyspnea on exertion, edema, orthopnea, palpitations, paroxysmal nocturnal dyspnea or shortness of breath Dermatological: negative for rash Respiratory: negative for cough or wheezing Urologic: negative for hematuria Abdominal: negative for nausea, vomiting, diarrhea, bright red blood per rectum, melena, or hematemesis Neurologic: negative for visual changes, syncope, or dizziness All other systems reviewed and are otherwise negative except as noted above.    Last menstrual period 11/30/2015.  General appearance: alert, cooperative and no distress Neck: no carotid bruit and  no JVD Lungs: clear to auscultation bilaterally Heart: regular rate and rhythm, S1, S2 normal, no murmur, click, rub or gallop Extremities: no LEE Pulses: 2+ and symmetric Skin: warm and dry Neurologic: Grossly normal  EKG NSR.   ASSESSMENT AND PLAN:   1. Palpations + Dyspnea: occur off and on. Currently asymptomatic. EKG an exam unremarkable. Recent ED w/u in [place] (CT of head, d-dimer and ABG) + w/u in [facility] normal. We will check a TSH and 30 day monitor.   2. PFO: ? TIA vs complicated migraine in the past. TEE showed small PFO, too small for closure. She denies any recurrent symptoms of stroke/ TIA.   PLAN  F/u in 1 month after monitor.   Lyda Jester [facility] 03/13/2016 7:56 AM

## 2016-03-13 NOTE — Patient Instructions (Addendum)
Medication Instructions:   Your physician recommends that you continue on your current medications as directed. Please refer to the Current Medication list given to you today.    If you need a refill on your cardiac medications before your next appointment, please call your pharmacy.  Labwork:  TSH TODAY    Testing/Procedures:  Your physician has recommended that you wear an event monitor. Event monitors are medical devices that record the heart's electrical activity. Doctors most often [place] these monitors to diagnose arrhythmias. Arrhythmias are problems with the speed or rhythm of the heartbeat. The monitor is a small, portable device. You can wear one while you do your normal daily activities. This is usually used to diagnose what is causing palpitations/syncope (passing out).   Follow-Up:  WITH DR Burt Knack OR AVAIBLE APP IN ONE MONTH    Any Other Special Instructions Will Be Listed Below (If Applicable).

## 2016-03-14 ENCOUNTER — Ambulatory Visit (INDEPENDENT_AMBULATORY_CARE_PROVIDER_SITE_OTHER): Payer: 59

## 2016-03-14 ENCOUNTER — Encounter: Payer: Self-pay | Admitting: Family

## 2016-03-14 DIAGNOSIS — R0600 Dyspnea, unspecified: Secondary | ICD-10-CM

## 2016-03-14 DIAGNOSIS — R42 Dizziness and giddiness: Secondary | ICD-10-CM

## 2016-03-14 DIAGNOSIS — R002 Palpitations: Secondary | ICD-10-CM | POA: Diagnosis not present

## 2016-03-15 ENCOUNTER — Telehealth: Payer: Self-pay | Admitting: *Deleted

## 2016-03-15 NOTE — Telephone Encounter (Signed)
SPOKE TO PATIENTS ABOUT LAB RESULTS AND VERBALIZED UNDERSTANDING

## 2016-03-15 NOTE — Telephone Encounter (Signed)
-----   Message from Consuelo Pandy, [place] sent at 03/13/2016  5:47 PM EDT ----- TSH is normal.

## 2016-03-28 DIAGNOSIS — N926 Irregular menstruation, unspecified: Secondary | ICD-10-CM | POA: Diagnosis not present

## 2016-03-29 ENCOUNTER — Encounter: Payer: Self-pay | Admitting: Cardiovascular Disease

## 2016-04-02 ENCOUNTER — Encounter: Payer: Self-pay | Admitting: Family Medicine

## 2016-04-02 ENCOUNTER — Ambulatory Visit (INDEPENDENT_AMBULATORY_CARE_PROVIDER_SITE_OTHER): Payer: 59 | Admitting: Family Medicine

## 2016-04-02 ENCOUNTER — Telehealth: Payer: Self-pay | Admitting: Physician Assistant

## 2016-04-02 ENCOUNTER — Other Ambulatory Visit: Payer: Self-pay | Admitting: Family Medicine

## 2016-04-02 VITALS — BP 106/64 | HR 67 | Temp 98.1°F | Ht 64.0 in | Wt 130.1 lb

## 2016-04-02 DIAGNOSIS — R002 Palpitations: Secondary | ICD-10-CM | POA: Diagnosis not present

## 2016-04-02 DIAGNOSIS — E559 Vitamin D deficiency, unspecified: Secondary | ICD-10-CM

## 2016-04-02 DIAGNOSIS — E785 Hyperlipidemia, unspecified: Secondary | ICD-10-CM

## 2016-04-02 DIAGNOSIS — E782 Mixed hyperlipidemia: Secondary | ICD-10-CM

## 2016-04-02 DIAGNOSIS — Q2112 Patent foramen ovale: Secondary | ICD-10-CM

## 2016-04-02 DIAGNOSIS — Q211 Atrial septal defect: Secondary | ICD-10-CM

## 2016-04-02 DIAGNOSIS — K219 Gastro-esophageal reflux disease without esophagitis: Secondary | ICD-10-CM

## 2016-04-02 DIAGNOSIS — R0602 Shortness of breath: Secondary | ICD-10-CM | POA: Diagnosis not present

## 2016-04-02 LAB — CBC
HCT: 37.8 % (ref 36.0–46.0)
HEMOGLOBIN: 12.8 g/dL (ref 12.0–15.0)
MCHC: 34 g/dL (ref 30.0–36.0)
MCV: 87.1 fl (ref 78.0–100.0)
PLATELETS: 184 10*3/uL (ref 150.0–400.0)
RBC: 4.33 Mil/uL (ref 3.87–5.11)
RDW: 13 % (ref 11.5–15.5)
WBC: 7.1 10*3/uL (ref 4.0–10.5)

## 2016-04-02 LAB — COMPREHENSIVE METABOLIC PANEL
ALBUMIN: 4.5 g/dL (ref 3.5–5.2)
ALK PHOS: 52 U/L (ref 39–117)
ALT: 14 U/L (ref 0–35)
AST: 17 U/L (ref 0–37)
BILIRUBIN TOTAL: 0.5 mg/dL (ref 0.2–1.2)
BUN: 12 mg/dL (ref 6–23)
CALCIUM: 9.4 mg/dL (ref 8.4–10.5)
CO2: 29 meq/L (ref 19–32)
CREATININE: 0.67 mg/dL (ref 0.40–1.20)
Chloride: 105 mEq/L (ref 96–112)
GFR: 100.58 mL/min (ref >60.00)
Glucose, Bld: 91 mg/dL (ref 70–99)
Potassium: 3.9 mEq/L (ref 3.5–5.1)
Sodium: 140 mEq/L (ref 135–145)
TOTAL PROTEIN: 7.6 g/dL (ref 6.0–8.3)

## 2016-04-02 LAB — D-DIMER, QUANTITATIVE: D-Dimer, Quant: 0.39 mcg/mL FEU (ref <0.50)

## 2016-04-02 NOTE — Telephone Encounter (Signed)
Patient informed of results/instructions. She is going to think about it, not sure she will do it.

## 2016-04-02 NOTE — Progress Notes (Signed)
Pre visit review using our clinic review tool, if applicable. No additional management support is needed unless otherwise documented below in the visit note. 

## 2016-04-02 NOTE — Assessment & Plan Note (Signed)
While in [facility] recently when she went to 10,800 feet she had sudden onset of SOB, fingers tingling, weakness feeling dizzy with elevated BP and tachycardia. Went to [facility] then ultimately to ER twice in [place]. They diagnosed her with acute

## 2016-04-02 NOTE — Patient Instructions (Signed)

## 2016-04-02 NOTE — Telephone Encounter (Signed)
Received STAT d-dimer result at 0.39. Is normal -- lowers likelihood of clot. Dr. Charlett Blake had ordered CTA. Is she proceeding with this?

## 2016-04-14 DIAGNOSIS — R0602 Shortness of breath: Secondary | ICD-10-CM | POA: Insufficient documentation

## 2016-04-14 NOTE — Assessment & Plan Note (Signed)
Encouraged heart healthy diet, increase exercise, avoid trans fats, consider a krill oil cap daily 

## 2016-04-14 NOTE — Assessment & Plan Note (Signed)
Encouraged to get adequate exercise, calcium and vitamin d intake 

## 2016-04-14 NOTE — Assessment & Plan Note (Signed)
Avoid offending foods, start probiotics. Do not eat large meals in late evening and consider raising head of bed.  

## 2016-04-14 NOTE — Progress Notes (Signed)
Patient ID: Melinda Kirk, female   DOB: Dec 17, 1970, 45 y.o.   MRN: [id number]   Subjective:    Patient ID: Melinda Kirk, female    DOB: 1971-01-31, 45 y.o.   MRN: [id number]  Chief Complaint  Patient presents with  . Follow-up    HPI Patient is in today for to evaluate for episodes of SOB and dizziness since returning from [facility]. She is not having any symptoms today but she has been worried. She notes some tingling in her feet and hands when the episodes hit. Some palpitations but no chet pain. No fevers or illness. Denies CP/HA/congestion. Taking meds as prescribed  Past Medical History:  Diagnosis Date  . Anemia 07/08/2015  . Breast calcification, left 07/29/04  . GERD (gastroesophageal reflux disease)   . Headache(784.0) 01/03/2013  . Menorrhagia 03/07/2013  . Migraine headache with aura 01/03/2013  . Other and unspecified hyperlipidemia 02/08/2013  . Palpitations 04/02/2016  . Preventative health care 01/22/2011  . Thyroid nodule    s/p FNA- 4/09- benign  . Thyroid nodule 01/15/2011   H/o thyroid nodule   . Unspecified hypothyroidism 01/15/2011  . Vitamin D deficiency 03/08/2011    Past Surgical History:  Procedure Laterality Date  . BIOPSY THYROID  10/2007   benign biopsy of thyroid nodules.  . TEE WITHOUT CARDIOVERSION N/A 02/10/2014   Procedure: TRANSESOPHAGEAL ECHOCARDIOGRAM (TEE);  Surgeon: [name], MD;  Location: [facility] ENDOSCOPY;  Service: Cardiovascular;  Laterality: N/A;    Family History  Problem Relation Age of Onset  . Stroke Mother   . Arthritis Mother   . Heart disease Father     CHF?    Social History   Social History  . Marital status: Married    Spouse name: N/A  . Number of children: 3  . Years of education: N/A   Occupational History  . Nurse    Social History Main Topics  . Smoking status: Former Smoker    Years: 5.00  . Smokeless tobacco: Never Used  . Alcohol use Yes     Comment: occasional  . Drug use: No  . Sexual activity: Not  on file   Other Topics Concern  . Not on file   Social History Narrative   Regular exercise:  No   Caffeine: 1-2 cups coffee / tea daily.   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    04/05/2015 last update       Outpatient Medications Prior to Visit  Medication Sig Dispense Refill  . aspirin EC 81 MG tablet Take 81 mg by mouth daily.    . B Complex-Biotin-FA (VITAMIN B50 COMPLEX PO) Take 1 tablet by mouth daily.    . Calcium Carbonate-Vitamin D (CALTRATE 600+D PO) Take 1 tablet by mouth daily.    . Cholecalciferol 2000 UNITS CAPS Take by mouth daily.    . Collagen 500 MG CAPS Take by mouth daily.    . ferrous sulfate 325 (65 FE) MG tablet Take 325 mg by mouth daily with breakfast.    . hyoscyamine (LEVSIN SL) 0.125 MG SL tablet Place 1 tablet (0.125 mg total) under the tongue every 4 (four) hours as needed. 30 tablet 1  . magnesium oxide (MAG-OX) 400 MG tablet Take 400 mg by mouth daily.    . meclizine (ANTIVERT) 25 MG tablet Take 1 tablet (25 mg total) by mouth 3 (three) times daily as needed for  dizziness. 30 tablet 0  . Multiple Vitamins-Minerals (MULTIVITAMIN WITH MINERALS) tablet Take 1 tablet by mouth daily.    . naproxen sodium (ALEVE) 220 MG tablet Take 440 mg by mouth daily as needed (headache).    . Omega-3 Fatty Acids (EQL OMEGA 3 FISH OIL) 1400 MG CAPS Take 1,400 mg by mouth daily.     [name] omeprazole (PRILOSEC) 40 MG capsule TAKE 1 CAPSULE BY MOUTH DAILY. 30 capsule 4  . Probiotic Product (PROBIOTIC ADVANCED) CAPS Take 1 capsule by mouth daily.    . ranitidine (ZANTAC) 300 MG tablet Take 1 tablet (300 mg total) by mouth at bedtime. 30 tablet 1  . Turmeric 450 MG CAPS Take by mouth daily.    . vitamin C (ASCORBIC ACID) 500 MG tablet Take 500 mg by mouth daily.    . vitamin E 400 UNIT capsule Take 400 Units by mouth daily.     No facility-administered medications prior to visit.     No Known Allergies  Review of Systems    Constitutional: Negative for fever and malaise/fatigue.  HENT: Negative for congestion.   Eyes: Negative for blurred vision.  Respiratory: Positive for shortness of breath.   Cardiovascular: Negative for chest pain, palpitations and leg swelling.  Gastrointestinal: Negative for abdominal pain, blood in stool and nausea.  Genitourinary: Negative for dysuria and frequency.  Musculoskeletal: Negative for falls.  Skin: Negative for rash.  Neurological: Positive for dizziness. Negative for loss of consciousness and headaches.  Endo/Heme/Allergies: Negative for environmental allergies.  Psychiatric/Behavioral: Negative for depression. The patient is not nervous/anxious.        Objective:    Physical Exam  BP 106/64 (BP Location: Left Arm, Patient Position: Sitting, Cuff Size: Normal)   Pulse 67   Temp 98.1 F (36.7 C) (Oral)   Ht 5\' 4"  (1.626 m)   Wt 130 lb 2 oz (59 kg)   SpO2 93%   BMI 22.34 kg/m  Wt Readings from Last 3 Encounters:  04/02/16 130 lb 2 oz (59 kg)  03/13/16 132 lb (59.9 kg)  07/04/15 127 lb 2 oz (57.7 kg)     Lab Results  Component Value Date   WBC 7.1 04/02/2016   HGB 12.8 04/02/2016   HCT 37.8 04/02/2016   PLT 184.0 04/02/2016   GLUCOSE 91 04/02/2016   CHOL 186 01/26/2015   TRIG 77.0 01/26/2015   HDL 60.00 01/26/2015   LDLCALC 111 (H) 01/26/2015   ALT 14 04/02/2016   AST 17 04/02/2016   NA 140 04/02/2016   K 3.9 04/02/2016   CL 105 04/02/2016   CREATININE 0.67 04/02/2016   BUN 12 04/02/2016   CO2 29 04/02/2016   TSH 0.89 03/13/2016   INR 0.9 02/04/2014    Lab Results  Component Value Date   TSH 0.89 03/13/2016   Lab Results  Component Value Date   WBC 7.1 04/02/2016   HGB 12.8 04/02/2016   HCT 37.8 04/02/2016   MCV 87.1 04/02/2016   PLT 184.0 04/02/2016   Lab Results  Component Value Date   NA 140 04/02/2016   K 3.9 04/02/2016   CO2 29 04/02/2016   GLUCOSE 91 04/02/2016   BUN 12 04/02/2016   CREATININE 0.67 04/02/2016    BILITOT 0.5 04/02/2016   ALKPHOS 52 04/02/2016   AST 17 04/02/2016   ALT 14 04/02/2016   PROT 7.6 04/02/2016   ALBUMIN 4.5 04/02/2016   CALCIUM 9.4 04/02/2016   ANIONGAP 8 03/05/2016   GFR 100.58 04/02/2016  Lab Results  Component Value Date   CHOL 186 01/26/2015   Lab Results  Component Value Date   HDL 60.00 01/26/2015   Lab Results  Component Value Date   LDLCALC 111 (H) 01/26/2015   Lab Results  Component Value Date   TRIG 77.0 01/26/2015   Lab Results  Component Value Date   CHOLHDL 3 01/26/2015   No results found for: HGBA1C     Assessment & Plan:   Problem List Items Addressed This Visit    Vitamin D deficiency    Encouraged to get adequate exercise, calcium and vitamin d intake      Hyperlipidemia, mixed    Encouraged heart healthy diet, increase exercise, avoid trans fats, consider a krill oil cap daily      GERD (gastroesophageal reflux disease)    Avoid offending foods, start probiotics. Do not eat large meals in late evening and consider raising head of bed.       PFO (patent foramen ovale)    Patient very stressed about her SOB and dizziness at times after a recent trip to [facility], will proceed with CT scan and patient to consider possibility of anxiety as a contributing factor      Relevant Orders   CT Angio Chest W/Cm &/Or Wo Cm   CBC (Completed)   Comprehensive metabolic panel (Completed)   D-Dimer, Quantitative (Completed)   Palpitations    While in [facility] recently when she went to 10,800 feet she had sudden onset of SOB, fingers tingling, weakness feeling dizzy with elevated BP and tachycardia. Went to [facility] then ultimately to ER twice in [place]. They diagnosed her with acute       Relevant Orders   CT Angio Chest W/Cm &/Or Wo Cm   CBC (Completed)   Comprehensive metabolic panel (Completed)   D-Dimer, Quantitative (Completed)   SOB (shortness of breath) - Primary   Relevant Orders   CT Angio Chest W/Cm &/Or Wo Cm   CBC  (Completed)   Comprehensive metabolic panel (Completed)   D-Dimer, Quantitative (Completed)    Other Visit Diagnoses    Hyperlipidemia, mild       Relevant Orders   Lipid panel      I am having Ms. Borchert maintain her Calcium Carbonate-Vitamin D (CALTRATE 600+D PO), aspirin EC, ferrous sulfate, vitamin C, EQL OMEGA 3 FISH OIL, vitamin E, naproxen sodium, multivitamin with minerals, ranitidine, B Complex-Biotin-FA (VITAMIN B50 COMPLEX PO), magnesium oxide, PROBIOTIC ADVANCED, Turmeric, Collagen, hyoscyamine, Cholecalciferol, omeprazole, and meclizine.  No orders of the defined types were placed in this encounter.    [name], MD

## 2016-04-14 NOTE — Assessment & Plan Note (Addendum)
Patient very stressed about her SOB and dizziness at times after a recent trip to [facility], will proceed with CT scan and patient to consider possibility of anxiety as a contributing factor

## 2016-04-15 ENCOUNTER — Ambulatory Visit (INDEPENDENT_AMBULATORY_CARE_PROVIDER_SITE_OTHER): Payer: 59 | Admitting: Cardiovascular Disease

## 2016-04-15 VITALS — BP 104/62 | HR 70 | Ht 62.5 in | Wt 132.8 lb

## 2016-04-15 DIAGNOSIS — Q211 Atrial septal defect: Secondary | ICD-10-CM | POA: Diagnosis not present

## 2016-04-15 DIAGNOSIS — Q2112 Patent foramen ovale: Secondary | ICD-10-CM

## 2016-04-15 DIAGNOSIS — R002 Palpitations: Secondary | ICD-10-CM | POA: Diagnosis not present

## 2016-04-15 NOTE — Progress Notes (Signed)
Cardiology Office Note Date:  04/15/2016   ID:  [name], DOB 03-13-1971, MRN [id number]  PCP:  Melinda Homans, MD  Cardiologist:  Melinda Mocha, MD    Chief Complaint  Patient presents with  . Shortness of Breath    f/u cardiac monitor and discuss PFO     History of Present Illness: Melinda Kirk is a 45 y.o. female who presents for Follow-up evaluation. The patient has a history of cryptogenic stroke and she was found to have a PFO on TEE back in 2015. She was evaluated at that time and medical therapy was recommended. Otherwise her left ventricular function was normal. She was recently seen after experiencing acute dyspnea, hypertension, and heart palpitations that occurred at high altitude. At that time she was evaluated in the emergency department and was noted to be hypoxemic with oxygen saturations in the 80s. Symptoms improved with supplemental oxygen.  The patient is here with her mother today. She continues to have episodic shortness of breath, dyspnea, and heart palpitations. She's had no recent chest pain. She has a great deal of anxiety about her symptoms, recent episode at high altitude, and concerns about her PFO. 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 She was out of work for several weeks because of her symptoms.   Past Medical History:  Diagnosis Date  . Anemia 07/08/2015  . Breast calcification, left 07/29/04  . GERD (gastroesophageal reflux disease)   . Headache(784.0) 01/03/2013  . Menorrhagia 03/07/2013  . Migraine headache with aura 01/03/2013  . Other and unspecified hyperlipidemia 02/08/2013  . Palpitations 04/02/2016  . Preventative health care 01/22/2011  . Thyroid nodule    s/p FNA- 4/09- benign  . Thyroid nodule 01/15/2011   H/o thyroid nodule   . Unspecified hypothyroidism 01/15/2011  . Vitamin D deficiency 03/08/2011    Past Surgical History:  Procedure Laterality Date  . BIOPSY THYROID  10/2007   benign biopsy of thyroid nodules.  . TEE WITHOUT  CARDIOVERSION N/A 02/10/2014   Procedure: TRANSESOPHAGEAL ECHOCARDIOGRAM (TEE);  Surgeon: Melinda Casino, MD;  [facility] ENDOSCOPY;  Service: Cardiovascular;  Laterality: N/A;    Current Outpatient Prescriptions  Medication Sig Dispense Refill  . aspirin EC 81 MG tablet Take 81 mg by mouth daily.    . B Complex-Biotin-FA (VITAMIN B50 COMPLEX PO) Take 1 tablet by mouth daily.    . Calcium Carbonate-Vitamin D (CALTRATE 600+D PO) Take 1 tablet by mouth daily.    . Cholecalciferol 2000 UNITS CAPS Take 1 capsule by mouth daily.     . Collagen 500 MG CAPS Take 500 mg by mouth daily.     . ferrous sulfate 325 (65 FE) MG tablet Take 325 mg by mouth daily with breakfast.    . hyoscyamine (LEVSIN SL) 0.125 MG SL tablet Place 1 tablet (0.125 mg total) under the tongue every 4 (four) hours as needed. 30 tablet 1  . magnesium oxide (MAG-OX) 400 MG tablet Take 400 mg by mouth daily.    . meclizine (ANTIVERT) 25 MG tablet Take 1 tablet (25 mg total) by mouth 3 (three) times daily as needed for dizziness. 30 tablet 0  . Multiple Vitamins-Minerals (MULTIVITAMIN WITH MINERALS) tablet Take 1 tablet by mouth daily.    . naproxen sodium (ALEVE) 220 MG tablet Take 440 mg by mouth daily as needed (headache).    . Omega-3 Fatty Acids (EQL OMEGA 3 FISH OIL) 1400 MG CAPS Take 1,400 mg by mouth daily.     [name]  omeprazole (PRILOSEC) 40 MG capsule TAKE 1 CAPSULE BY MOUTH DAILY. 30 capsule 4  . Probiotic Product (PROBIOTIC ADVANCED) CAPS Take 1 capsule by mouth daily.    . ranitidine (ZANTAC) 300 MG tablet Take 1 tablet (300 mg total) by mouth at bedtime. 30 tablet 1  . Turmeric 450 MG CAPS Take 1 capsule by mouth daily.     . vitamin C (ASCORBIC ACID) 500 MG tablet Take 500 mg by mouth daily.    . vitamin E 400 UNIT capsule Take 400 Units by mouth daily.     No current facility-administered medications for this visit.     Allergies:   Review of patient's allergies indicates no known allergies.   Social History:   The patient  reports that she has quit smoking. She quit after 5.00 years of use. She has never used smokeless tobacco. She reports that she drinks alcohol. She reports that she does not use drugs.   Family History:  The patient's  family history includes Arthritis in her mother; Heart disease in her father; Stroke in her mother.    ROS:  Please see the history of present illness.  Otherwise, review of systems is positive for back pain, dizziness.  All other systems are reviewed and negative.    PHYSICAL EXAM: VS:  BP 104/62   Pulse 70   Ht 5' 2.5" (1.588 m)   Wt 132 lb 12.8 oz (60.2 kg)   SpO2 98%   BMI 23.90 kg/m  , BMI Body mass index is 23.9 kg/m. GEN: Well nourished, well developed, in no acute distress  HEENT: normal  Neck: no JVD, no masses. No carotid bruits Cardiac: RRR without murmur or gallop                Respiratory:  clear to auscultation bilaterally, normal work of breathing GI: soft, nontender, nondistended, + BS MS: no deformity or atrophy  Ext: no pretibial edema, pedal pulses 2+= bilaterally Skin: warm and dry, no rash Neuro:  Strength and sensation are intact Psych: euthymic mood, full affect  EKG:  EKG is not ordered today.  Recent Labs: 03/13/2016: TSH 0.89 04/02/2016: ALT 14; BUN 12; Creatinine, Ser 0.67; Hemoglobin 12.8; Platelets 184.0; Potassium 3.9; Sodium 140   Lipid Panel     Component Value Date/Time   CHOL 186 01/26/2015 1356   TRIG 77.0 01/26/2015 1356   HDL 60.00 01/26/2015 1356   CHOLHDL 3 01/26/2015 1356   VLDL 15.4 01/26/2015 1356   LDLCALC 111 (H) 01/26/2015 1356      Wt Readings from Last 3 Encounters:  04/15/16 132 lb 12.8 oz (60.2 kg)  04/02/16 130 lb 2 oz (59 kg)  03/13/16 132 lb (59.9 kg)     Cardiac Studies Reviewed: 2D Echo 03-12-2016: Study Conclusions  - Left ventricle: The cavity size was normal. Wall thickness was   normal. Systolic function was normal. The estimated ejection   fraction was in the range of 60%  to 65%. Wall motion was normal;   there were no regional wall motion abnormalities. Doppler   parameters are consistent with abnormal left ventricular   relaxation (grade 1 diastolic dysfunction). - Mitral valve: There was mild regurgitation.  ASSESSMENT AND PLAN: PFO: I have previously reviewed her TEE images. I do think her PFO is amenable to transcatheter closure if indicated. It is unclear to me whether it has anything to do with her current symptoms. She has just completed and 30 day event monitor and will review this  as soon as it is available. It is possible that she has or 30 orthodeoxia related to the PFO which could occur at high altitude. I have recommended an exercise tolerance test with O2 pulse oximetry to evaluate for exercise induced hypoxemia. She has no history of lung disease or other explanation for this. It may have simply occurred with high altitude and altitude sickness. Further plans pending treadmill results.   Would be reasonable to consider PFO closure for secondary risk reduction of stroke but I doubt it would impact her other symptoms.   Current medicines are reviewed with the patient today.  The patient does not have concerns regarding medicines.  Labs/ tests ordered today include:  No orders of the defined types were placed in this encounter.   Disposition:   FU after exercise stress test  Signed, Melinda Mocha, MD  04/15/2016 3:14 PM    [facility] [address], [place], [place]  [address] Phone: [phone]; Fax: [phone]

## 2016-04-15 NOTE — Patient Instructions (Signed)
Medication Instructions:  Your physician recommends that you continue on your current medications as directed. Please refer to the Current Medication list given to you today.  Labwork: No new orders.   Testing/Procedures: Your physician has requested that you have an exercise tolerance test with pulse oximetry. For further information please visit [email or web address]. Please also follow instruction sheet, as given.  Follow-Up: We will arrange further follow-up after reviewing treadmill results.   Any Other Special Instructions Will Be Listed Below (If Applicable).     If you need a refill on your cardiac medications before your next appointment, please call your pharmacy.

## 2016-04-17 ENCOUNTER — Other Ambulatory Visit: Payer: Self-pay | Admitting: Family Medicine

## 2016-04-17 ENCOUNTER — Telehealth: Payer: Self-pay | Admitting: Cardiovascular Disease

## 2016-04-17 MED FILL — raNITIdine HCL 300 MG TABS: 300 | 30 days supply | Qty: 30 | Fill #0

## 2016-04-17 NOTE — Telephone Encounter (Signed)
New message    Pt calling about the FMLA papers being filled out and be faxed to [phone] and phone # [phone] ext. [phone].

## 2016-04-17 NOTE — Telephone Encounter (Signed)
Will forward to medical records to follow up.

## 2016-04-18 ENCOUNTER — Encounter: Payer: Self-pay | Admitting: Cardiovascular Disease

## 2016-04-22 ENCOUNTER — Telehealth: Payer: Self-pay | Admitting: Family Medicine

## 2016-04-22 NOTE — Telephone Encounter (Signed)
Pt is calling regarding getting a prescription for Xanax .25 as needed. Please advise  Patient Phone: [phone] Patient Relation: Self

## 2016-04-23 NOTE — Telephone Encounter (Signed)
I agree I do not see it in her list. What does she feel she needs it for?

## 2016-04-23 NOTE — Telephone Encounter (Signed)
Last appt. Was on 04/02/16 I do not see alprazolam on her list.

## 2016-04-24 NOTE — Telephone Encounter (Signed)
Am willing to write her the Alprazolam 0.25 mg tabs, 1 tab po tid prn anxiety, insomnia. Disp #30 with 1 rf but let her know I have to see her every 6 months and have her sign a contract if she is going to want refills intermittently

## 2016-04-24 NOTE — Telephone Encounter (Signed)
Pt states she takes PRN for sleep (when her reflux is bothering her) and anxiety. Was [place]. Previous sig for 0.25 mg TID PRN, but pt states she usually only takes 1 every 2-3 days. Please advise.

## 2016-04-25 ENCOUNTER — Ambulatory Visit (INDEPENDENT_AMBULATORY_CARE_PROVIDER_SITE_OTHER): Payer: 59

## 2016-04-25 DIAGNOSIS — Q2112 Patent foramen ovale: Secondary | ICD-10-CM

## 2016-04-25 DIAGNOSIS — R002 Palpitations: Secondary | ICD-10-CM | POA: Diagnosis not present

## 2016-04-25 DIAGNOSIS — Q211 Atrial septal defect: Secondary | ICD-10-CM | POA: Diagnosis not present

## 2016-04-25 LAB — EXERCISE TOLERANCE TEST
CHL CUP STRESS STAGE 1 DBP: 87 mmHg
CHL CUP STRESS STAGE 1 GRADE: 0 %
CHL CUP STRESS STAGE 1 SBP: 130 mmHg
CHL CUP STRESS STAGE 1 SPEED: 0 mph
CHL CUP STRESS STAGE 2 HR: 88 {beats}/min
CHL CUP STRESS STAGE 3 SPEED: 1 mph
CHL CUP STRESS STAGE 4 SPEED: 1 mph
CHL CUP STRESS STAGE 5 DBP: 76 mmHg
CHL CUP STRESS STAGE 5 HR: 127 {beats}/min
CHL CUP STRESS STAGE 5 SBP: 158 mmHg
CHL CUP STRESS STAGE 5 SPEED: 1.7 mph
CHL CUP STRESS STAGE 6 DBP: 77 mmHg
CHL CUP STRESS STAGE 6 HR: 144 {beats}/min
CHL CUP STRESS STAGE 6 SBP: 156 mmHg
CHL CUP STRESS STAGE 7 SPEED: 3.4 mph
CHL CUP STRESS STAGE 8 GRADE: 0 %
CHL CUP STRESS STAGE 8 SPEED: 0 mph
CHL CUP STRESS STAGE 9 DBP: 77 mmHg
CHL CUP STRESS STAGE 9 GRADE: 0 %
CHL CUP STRESS STAGE 9 SPEED: 0 mph
Estimated workload: 8.5 METS
Exercise duration (min): 7 min
Exercise duration (sec): 0 s
MPHR: 174 {beats}/min
Peak HR: 151 {beats}/min
Percent HR: 90 %
Percent of predicted max HR: 86 %
RPE: 8.5
Rest HR: 70 {beats}/min
Stage 1 HR: 89 {beats}/min
Stage 2 Grade: 0 %
Stage 2 Speed: 0 mph
Stage 3 Grade: 0 %
Stage 3 HR: 82 {beats}/min
Stage 4 Grade: 0 %
Stage 4 HR: 82 {beats}/min
Stage 5 Grade: 10 %
Stage 6 Grade: 12 %
Stage 6 Speed: 2.5 mph
Stage 7 Grade: 14 %
Stage 7 HR: 151 {beats}/min
Stage 8 DBP: 75 mmHg
Stage 8 HR: 116 {beats}/min
Stage 8 SBP: 144 mmHg
Stage 9 HR: 88 {beats}/min
Stage 9 SBP: 133 mmHg

## 2016-04-25 MED ORDER — ALPRAZOLAM 0.25 MG PO TABS: 0.2500 mg | ORAL_TABLET | Freq: Three times a day (TID) | ORAL | 1 refills | Status: DC | PRN

## 2016-04-25 MED FILL — ALPRAZolam 0.25 MG TABS: 0.25 | 10 days supply | Qty: 30 | Fill #0

## 2016-04-25 NOTE — Telephone Encounter (Signed)
Called the patient informed of prescription instructions

## 2016-04-25 NOTE — Telephone Encounter (Signed)
Printed and on counter for signature. Faxed to [facility].

## 2016-04-29 NOTE — Telephone Encounter (Signed)
Routing to Dr. Antionette Char nurse

## 2016-04-29 NOTE — Telephone Encounter (Signed)
Patient called 9/22-FMLA completed, faxed and she is aware ready for her pick up.

## 2016-06-05 ENCOUNTER — Encounter: Payer: Self-pay | Admitting: Cardiovascular Disease

## 2016-06-05 NOTE — Progress Notes (Signed)
Reviewed TEE. PFO is anatomically appropriate for transcatheter closure if patient would like to proceed. I called and left msg on her voicemail. Would like to arrange a FU office visit to review the procedural details, risks, and expectations. Advised to call back to schedule appt if she would like to pursue this.  [name] 06/05/2016 12:08 PM

## 2016-06-07 ENCOUNTER — Telehealth: Payer: Self-pay | Admitting: Cardiovascular Disease

## 2016-06-07 NOTE — Telephone Encounter (Signed)
Progress Notes by [name], MD at 06/05/2016 12:06 PM   Author: [name], MD Author Type: Physician Filed: 06/05/2016 12:08 PM  Note Status: Signed Cosign: Cosign Not Required Encounter Date: 06/05/2016 12:06 PM  Editor: [name], MD (Physician)    Reviewed TEE. PFO is anatomically appropriate for transcatheter closure if patient would like to proceed. I called and left msg on her voicemail. Would like to arrange a FU office visit to review the procedural details, risks, and expectations. Advised to call back to schedule appt if she would like to pursue this.  [name] 06/05/2016 12:08 PM     Advised Ander Purpura is out of the office and will contact her back to discuss scheduling this appointment.

## 2016-06-07 NOTE — Telephone Encounter (Signed)
Left message advising that Dr. Antionette Char nurse will call her back.  Dr. Burt Knack left patient a message regarding a follow up office visit to discuss transcatheter closure of PFO (TEE results)

## 2016-06-07 NOTE — Telephone Encounter (Signed)
Follow Up:   Pt called and said Dr Copper told her to call and talked to [place].

## 2016-06-11 NOTE — Telephone Encounter (Signed)
I left the pt a message to contact the office to arrange follow-up office visit with Dr Burt Knack to discuss PFO closure.

## 2016-06-12 NOTE — Telephone Encounter (Signed)
Left message on pt's voicemail to contact the office and arrange appointment tomorrow (06/13/16) with Dr Burt Knack.

## 2016-06-13 ENCOUNTER — Encounter (INDEPENDENT_AMBULATORY_CARE_PROVIDER_SITE_OTHER): Payer: Self-pay

## 2016-06-13 ENCOUNTER — Ambulatory Visit (INDEPENDENT_AMBULATORY_CARE_PROVIDER_SITE_OTHER): Payer: 59 | Admitting: Cardiovascular Disease

## 2016-06-13 ENCOUNTER — Encounter: Payer: Self-pay | Admitting: Cardiovascular Disease

## 2016-06-13 VITALS — BP 102/64 | HR 83 | Ht 63.5 in | Wt 131.8 lb

## 2016-06-13 DIAGNOSIS — Q211 Atrial septal defect: Secondary | ICD-10-CM | POA: Diagnosis not present

## 2016-06-13 DIAGNOSIS — Q2112 Patent foramen ovale: Secondary | ICD-10-CM

## 2016-06-13 MED ORDER — CLOPIDOGREL BISULFATE 75 MG PO TABS: 75.0000 mg | ORAL_TABLET | Freq: Every day | ORAL | 3 refills | Status: DC

## 2016-06-13 MED FILL — CLOPIDOGREL 75 MG TABLET: 75 | 90 days supply | Qty: 90 | Fill #0

## 2016-06-13 NOTE — Progress Notes (Signed)
Cardiology Office Note Date:  06/13/2016   ID:  SUMA ROCCAFORTE, DOB July 28, 1971, MRN [id number]  PCP:  [name], MD  Cardiologist:  [name], MD    Chief Complaint  Patient presents with  . PFO    History of Present Illness: Melinda Kirk is a 45 y.o. female who presents for follow-up evaluation. The patient has a history of cryptogenic stroke and she was found to have a PFO on TEE back in 2015. She was evaluated at that time and medical therapy was recommended. Otherwise her left ventricular function was normal. She was recently seen after experiencing acute dyspnea, hypertension, and heart palpitations that occurred at high altitude. At that time she was evaluated in the emergency department and was noted to be hypoxemic with oxygen saturations in the 80s. Symptoms improved with supplemental oxygen.  She is here today to further discuss consideration of PFO closure. The patient has had a strong desire to undergo PFO closure since her initial evaluation. At the time the procedure was not FDA approved, but after new data demonstrated improved secondary stroke risk reduction with PFO closure versus medical therapy, transcatheter PFO closure was improved last year. She was just seen in September after experiencing shortness of breath and hypoxemia at high altitude. The 30 day event monitor demonstrated no sustained arrhythmias. An exercise treadmill study demonstrated no evidence of hypoxemia.  She has no new complaints. No chest pain. Still with occasional palpitations and shortness of breath. No new neurologic symptoms.   Past Medical History:  Diagnosis Date  . Anemia 07/08/2015  . Breast calcification, left 07/29/04  . GERD (gastroesophageal reflux disease)   . Headache(784.0) 01/03/2013  . Menorrhagia 03/07/2013  . Migraine headache with aura 01/03/2013  . Other and unspecified hyperlipidemia 02/08/2013  . Palpitations 04/02/2016  . Preventative health care 01/22/2011  . Thyroid  nodule    s/p FNA- 4/09- benign  . Thyroid nodule 01/15/2011   H/o thyroid nodule   . Unspecified hypothyroidism 01/15/2011  . Vitamin D deficiency 03/08/2011    Past Surgical History:  Procedure Laterality Date  . BIOPSY THYROID  10/2007   benign biopsy of thyroid nodules.  . TEE WITHOUT CARDIOVERSION N/A 02/10/2014   Procedure: TRANSESOPHAGEAL ECHOCARDIOGRAM (TEE);  Surgeon: [name], MD;  Location: [facility] ENDOSCOPY;  Service: Cardiovascular;  Laterality: N/A;    Current Outpatient Prescriptions  Medication Sig Dispense Refill  . ALPRAZolam (XANAX) 0.25 MG tablet Take 1 tablet (0.25 mg total) by mouth 3 (three) times daily as needed for anxiety or sleep. 30 tablet 1  . aspirin EC 81 MG tablet Take 81 mg by mouth daily.    . B Complex-Biotin-FA (VITAMIN B50 COMPLEX PO) Take 1 tablet by mouth daily.    . Calcium Carbonate-Vitamin D (CALTRATE 600+D PO) Take 1 tablet by mouth daily.    . Cholecalciferol 2000 UNITS CAPS Take 1 capsule by mouth daily.     . ferrous sulfate 325 (65 FE) MG tablet Take 325 mg by mouth daily with breakfast.    . hyoscyamine (LEVSIN SL) 0.125 MG SL tablet Place 0.125 mg under the tongue every 4 (four) hours as needed (acid reflux).    . meclizine (ANTIVERT) 25 MG tablet Take 1 tablet (25 mg total) by mouth 3 (three) times daily as needed for dizziness. 30 tablet 0  . Multiple Vitamins-Minerals (MULTIVITAMIN WITH MINERALS) tablet Take 1 tablet by mouth daily.    . naproxen sodium (ALEVE) 220 MG tablet Take 440 mg  by mouth daily as needed (headache).    . Omega-3 Fatty Acids (EQL OMEGA 3 FISH OIL) 1400 MG CAPS Take 1,400 mg by mouth daily.     . Probiotic Product (PROBIOTIC ADVANCED) CAPS Take 1 capsule by mouth daily.    . ranitidine (ZANTAC) 300 MG tablet TAKE 1 TABLET (300 MG TOTAL) BY MOUTH AT BEDTIME. 30 tablet 1  . Turmeric 450 MG CAPS Take 1 capsule by mouth daily.     . vitamin C (ASCORBIC ACID) 500 MG tablet Take 500 mg by mouth daily.    . clopidogrel  (PLAVIX) 75 MG tablet Take 1 tablet (75 mg total) by mouth daily. 90 tablet 3   No current facility-administered medications for this visit.     Allergies:   Patient has no known allergies.   Social History:  The patient  reports that she has quit smoking. She quit after 5.00 years of use. She has never used smokeless tobacco. She reports that she drinks alcohol. She reports that she does not use drugs.   Family History:  The patient's  family history includes Arthritis in her mother; Heart disease in her father; Stroke in her mother.    ROS:  Please see the history of present illness.  Otherwise, review of systems is positive for Back pain, muscle pain.  All other systems are reviewed and negative.    PHYSICAL EXAM: VS:  BP 102/64   Pulse 83   Ht 5' 3.5" (1.613 m)   Wt 131 lb 12.8 oz (59.8 kg)   BMI 22.98 kg/m  , BMI Body mass index is 22.98 kg/m. GEN: Well nourished, well developed, in no acute distress  Exam deferred as our time was spent in discussion and she recently underwent full exam 04-15-2016.  EKG:  EKG is not ordered today.  Recent Labs: 03/13/2016: TSH 0.89 04/02/2016: ALT 14; BUN 12; Creatinine, Ser 0.67; Hemoglobin 12.8; Platelets 184.0; Potassium 3.9; Sodium 140   Lipid Panel     Component Value Date/Time   CHOL 186 01/26/2015 1356   TRIG 77.0 01/26/2015 1356   HDL 60.00 01/26/2015 1356   CHOLHDL 3 01/26/2015 1356   VLDL 15.4 01/26/2015 1356   LDLCALC 111 (H) 01/26/2015 1356      Wt Readings from Last 3 Encounters:  06/13/16 131 lb 12.8 oz (59.8 kg)  04/15/16 132 lb 12.8 oz (60.2 kg)  04/02/16 130 lb 2 oz (59 kg)     Cardiac Studies Reviewed: Event monitor 03/14/2016: Study Highlights   Sinus rhythm throughout  No arrhythmia detected   GXT 04-25-2016: Study Highlights     Blood pressure demonstrated a normal response to exercise.  There was no ST segment deviation noted during stress.  No T wave inversion was noted during stress.      Echo 03-12-2016: Study Conclusions  - Left ventricle: The cavity size was normal. Wall thickness was   normal. Systolic function was normal. The estimated ejection   fraction was in the range of 60% to 65%. Wall motion was normal;   there were no regional wall motion abnormalities. Doppler   parameters are consistent with abnormal left ventricular   relaxation (grade 1 diastolic dysfunction). - Mitral valve: There was mild regurgitation.  ASSESSMENT AND PLAN: PFO with cryptogenic stroke: Again reviewed potential treatment options including continued medical therapy versus transcatheter closure. Available randomized control trial data reviewed with the patient. She strongly desires to proceed with PFO closure. Her TEE from 2015 is reviewed and demonstrates a  moderate size PFO with right-to-left shunt.  I reviewed the risks, indications, and alternatives to transcatheter PFO closure with the patient. Specific risks include bleeding, infection, device embolization, stroke, cardiac perforation, tamponade, arrhythmia, MI, and late device erosion. She understands these serious risks occur at low incidence of < 1%. Also discussed risk of atrial fibrillation up to 5% of the time and this generally is transient. She will start plavix prior to the procedure and understands the need to continue DAPT with ASA and plavix for a period of 6 months.   Current medicines are reviewed with the patient today.  The patient does not have concerns regarding medicines.  Labs/ tests ordered today include:   Orders Placed This Encounter  Procedures  . ECHOCARDIOGRAM LIMITED   Disposition:  Plans for transcatheter PFO closure as above  Signed, [name], MD  06/13/2016 1:41 PM    [facility] [address], [place], [place]  [address] Phone: [phone]; Fax: [phone]

## 2016-06-13 NOTE — Patient Instructions (Addendum)
Medication Instructions:  Your physician recommends that you continue on your current medications as directed. Please refer to the Current Medication list given to you today.  Please start plavix (clopidogrel) 75mg  on 06/28/2016 take one tablet by mouth daily.  Labwork: No new orders.   Testing/Procedures: Your physician has recommended PFO closure.  Any Other Special Instructions Will Be Listed Below (If Applicable).  Your physician recommends that you schedule a follow-up appointment in: 1 MONTH after PFO closure with Limited echocardiogram same day     If you need a refill on your cardiac medications before your next appointment, please call your pharmacy.

## 2016-06-13 NOTE — Telephone Encounter (Signed)
Pt scheduled to see Dr Burt Knack 06/13/16.

## 2016-06-18 ENCOUNTER — Ambulatory Visit (INDEPENDENT_AMBULATORY_CARE_PROVIDER_SITE_OTHER): Payer: 59 | Admitting: Family Medicine

## 2016-06-18 VITALS — BP 106/70 | HR 77 | Temp 98.4°F | Wt 132.8 lb

## 2016-06-18 DIAGNOSIS — K219 Gastro-esophageal reflux disease without esophagitis: Secondary | ICD-10-CM | POA: Diagnosis not present

## 2016-06-18 DIAGNOSIS — M25512 Pain in left shoulder: Secondary | ICD-10-CM | POA: Diagnosis not present

## 2016-06-18 DIAGNOSIS — R1310 Dysphagia, unspecified: Secondary | ICD-10-CM

## 2016-06-18 DIAGNOSIS — M545 Low back pain: Secondary | ICD-10-CM | POA: Diagnosis not present

## 2016-06-18 DIAGNOSIS — G43109 Migraine with aura, not intractable, without status migrainosus: Secondary | ICD-10-CM

## 2016-06-18 DIAGNOSIS — G8929 Other chronic pain: Secondary | ICD-10-CM

## 2016-06-18 DIAGNOSIS — Q211 Atrial septal defect: Secondary | ICD-10-CM

## 2016-06-18 DIAGNOSIS — Q2112 Patent foramen ovale: Secondary | ICD-10-CM

## 2016-06-18 DIAGNOSIS — E782 Mixed hyperlipidemia: Secondary | ICD-10-CM | POA: Diagnosis not present

## 2016-06-18 MED ORDER — SUCRALFATE 1 GM/10ML PO SUSP: 1.0000 g | Freq: Three times a day (TID) | ORAL | 0 refills | Status: DC

## 2016-06-18 MED ORDER — OMEPRAZOLE-SODIUM BICARBONATE 40-1100 MG PO CAPS: 1.0000 | ORAL_CAPSULE | Freq: Every day | ORAL | 2 refills | Status: DC

## 2016-06-18 MED ORDER — RABEPRAZOLE SODIUM 20 MG PO TBEC: 20.0000 mg | DELAYED_RELEASE_TABLET | Freq: Every day | ORAL | 3 refills | Status: DC

## 2016-06-18 MED FILL — RABEPRAZOLE SOD DR 20 MG TA: 20 | 30 days supply | Qty: 30 | Fill #0

## 2016-06-18 MED FILL — CARAFATE 1 GM/10 ML SUSP: 1 | 11 days supply | Qty: 420 | Fill #0

## 2016-06-18 NOTE — Patient Instructions (Signed)
Food Choices for Gastroesophageal Reflux Disease, Adult When you have gastroesophageal reflux disease (GERD), the foods you eat and your eating habits are very important. Choosing the right foods can help ease your discomfort. What guidelines do I need to follow?  Choose fruits, vegetables, whole grains, and low-fat dairy products.  Choose low-fat meat, fish, and poultry.  Limit fats such as oils, salad dressings, butter, nuts, and avocado.  Keep a food diary. This helps you identify foods that cause symptoms.  Avoid foods that cause symptoms. These may be different for everyone.  Eat small meals often instead of 3 large meals a day.  Eat your meals slowly, in a place where you are relaxed.  Limit fried foods.  Cook foods using methods other than frying.  Avoid drinking alcohol.  Avoid drinking large amounts of liquids with your meals.  Avoid bending over or lying down until 2-3 hours after eating. What foods are not recommended? These are some foods and drinks that may make your symptoms worse: Vegetables  Tomatoes. Tomato juice. Tomato and spaghetti sauce. Chili peppers. Onion and garlic. Horseradish. Fruits  Oranges, grapefruit, and lemon (fruit and juice). Meats  High-fat meats, fish, and poultry. This includes hot dogs, ribs, ham, sausage, salami, and bacon. Dairy  Whole milk and chocolate milk. Sour cream. Cream. Butter. Ice cream. Cream cheese. Drinks  Coffee and tea. Bubbly (carbonated) drinks or energy drinks. Condiments  Hot sauce. Barbecue sauce. Sweets/Desserts  Chocolate and cocoa. Donuts. Peppermint and spearmint. Fats and Oils  High-fat foods. This includes French fries and potato chips. Other  Vinegar. Strong spices. This includes black pepper, white pepper, red pepper, cayenne, curry powder, cloves, ginger, and chili powder. The items listed above may not be a complete list of foods and drinks to avoid. Contact your dietitian for more information.    This information is not intended to replace advice given to you by your health care provider. Make sure you discuss any questions you have with your health care provider. Document Released: 01/14/2012 Document Revised: 12/21/2015 Document Reviewed: 05/19/2013 Elsevier Interactive Patient Education  2017 Elsevier Inc.  

## 2016-06-18 NOTE — Progress Notes (Signed)
Pre visit review using our clinic review tool, if applicable. No additional management support is needed unless otherwise documented below in the visit note. 

## 2016-06-21 ENCOUNTER — Other Ambulatory Visit: Payer: Self-pay | Admitting: Cardiovascular Disease

## 2016-06-21 DIAGNOSIS — Q2112 Patent foramen ovale: Secondary | ICD-10-CM

## 2016-06-21 DIAGNOSIS — Q211 Atrial septal defect: Secondary | ICD-10-CM

## 2016-06-24 ENCOUNTER — Ambulatory Visit: Payer: 59 | Admitting: Family Medicine

## 2016-06-27 ENCOUNTER — Ambulatory Visit (INDEPENDENT_AMBULATORY_CARE_PROVIDER_SITE_OTHER): Payer: 59 | Admitting: Family Medicine

## 2016-06-27 ENCOUNTER — Ambulatory Visit: Payer: 59 | Admitting: Family Medicine

## 2016-06-27 ENCOUNTER — Encounter: Payer: Self-pay | Admitting: Family Medicine

## 2016-06-27 DIAGNOSIS — G8929 Other chronic pain: Secondary | ICD-10-CM | POA: Diagnosis not present

## 2016-06-27 DIAGNOSIS — M545 Low back pain, unspecified: Secondary | ICD-10-CM

## 2016-06-27 DIAGNOSIS — M25512 Pain in left shoulder: Secondary | ICD-10-CM | POA: Diagnosis not present

## 2016-06-27 NOTE — Assessment & Plan Note (Signed)
2/2 arthritis, lumbar spasms/strain.  Discussed tylenol, glucosamine, topical medications.  Encouraged home exercises, use of back brace.  Consider physical therapy again.  Do not think repeating imaging would be beneficial.

## 2016-06-27 NOTE — Progress Notes (Signed)
PCP and consultation requested by: [name], MD  Subjective:   HPI: Patient is a 45 y.o. female here for left shoulder, low back pain.  Patient reports she's had 2-3 months of pain in left upper arm pain. Worse with reaching, overhead motions. + night pain. Tried massage, heat, topical medication. No radiation. No numbness or tingling. No neck pain. Also has several year history of low back pain.  Did physical therapy, anti-inflammatories. Had MRI 2 years ago showing mild spondylosis but no evidence stenosis, significant discogenic issues. She uses a back brace which helps some. No bowel/bladder dysfunction. No radiation into legs. No numbness or tingling.  Past Medical History:  Diagnosis Date  . Anemia 07/08/2015  . Breast calcification, left 07/29/04  . GERD (gastroesophageal reflux disease)   . Headache(784.0) 01/03/2013  . Menorrhagia 03/07/2013  . Migraine headache with aura 01/03/2013  . Other and unspecified hyperlipidemia 02/08/2013  . Palpitations 04/02/2016  . Preventative health care 01/22/2011  . Thyroid nodule    s/p FNA- 4/09- benign  . Thyroid nodule 01/15/2011   H/o thyroid nodule   . Unspecified hypothyroidism 01/15/2011  . Vitamin D deficiency 03/08/2011    Current Outpatient Prescriptions on File Prior to Visit  Medication Sig Dispense Refill  . ALPRAZolam (XANAX) 0.25 MG tablet Take 1 tablet (0.25 mg total) by mouth 3 (three) times daily as needed for anxiety or sleep. 30 tablet 1  . aspirin EC 81 MG tablet Take 81 mg by mouth daily.    . B Complex-Biotin-FA (VITAMIN B50 COMPLEX PO) Take 1 tablet by mouth daily.    . Calcium Carbonate-Vitamin D (CALTRATE 600+D PO) Take 1 tablet by mouth daily.    . Cholecalciferol 2000 UNITS CAPS Take 1 capsule by mouth daily.     . clopidogrel (PLAVIX) 75 MG tablet Take 1 tablet (75 mg total) by mouth daily. 90 tablet 3  . ferrous sulfate 325 (65 FE) MG tablet Take 325 mg by mouth daily with breakfast.    . hyoscyamine  (LEVSIN SL) 0.125 MG SL tablet Place 0.125 mg under the tongue every 4 (four) hours as needed (acid reflux).    . meclizine (ANTIVERT) 25 MG tablet Take 1 tablet (25 mg total) by mouth 3 (three) times daily as needed for dizziness. 30 tablet 0  . Multiple Vitamins-Minerals (MULTIVITAMIN WITH MINERALS) tablet Take 1 tablet by mouth daily.    . naproxen sodium (ALEVE) 220 MG tablet Take 440 mg by mouth daily as needed (headache).    . Omega-3 Fatty Acids (EQL OMEGA 3 FISH OIL) 1400 MG CAPS Take 1,400 mg by mouth daily.     . Probiotic Product (PROBIOTIC ADVANCED) CAPS Take 1 capsule by mouth daily.    . RABEprazole (ACIPHEX) 20 MG tablet Take 1 tablet (20 mg total) by mouth daily. 30 tablet 3  . ranitidine (ZANTAC) 300 MG tablet TAKE 1 TABLET (300 MG TOTAL) BY MOUTH AT BEDTIME. 30 tablet 1  . sucralfate (CARAFATE) 1 GM/10ML suspension Take 10 mLs (1 g total) by mouth 4 (four) times daily -  with meals and at bedtime. 420 mL 0  . Turmeric 450 MG CAPS Take 1 capsule by mouth daily.     . vitamin C (ASCORBIC ACID) 500 MG tablet Take 500 mg by mouth daily.     No current facility-administered medications on file prior to visit.     Past Surgical History:  Procedure Laterality Date  . BIOPSY THYROID  10/2007   benign biopsy of  thyroid nodules.  . TEE WITHOUT CARDIOVERSION N/A 02/10/2014   Procedure: TRANSESOPHAGEAL ECHOCARDIOGRAM (TEE);  Surgeon: [name], MD;  Location: [facility] ENDOSCOPY;  Service: Cardiovascular;  Laterality: N/A;    No Known Allergies  Social History   Social History  . Marital status: Married    Spouse name: N/A  . Number of children: 3  . Years of education: N/A   Occupational History  . Nurse    Social History Main Topics  . Smoking status: Former Smoker    Years: 5.00  . Smokeless tobacco: Never Used  . Alcohol use Yes     Comment: occasional  . Drug use: No  . Sexual activity: Not on file   Other Topics Concern  . Not on file   Social History  Narrative   Regular exercise:  No   Caffeine: 1-2 cups coffee / tea daily.   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    04/05/2015 last update       Family History  Problem Relation Age of Onset  . Stroke Mother   . Arthritis Mother   . Heart disease Father     CHF?    Ht 5\' 3"  (1.6 m)   Wt 131 lb (59.4 kg)   BMI 23.21 kg/m   Review of Systems: See HPI above.     Objective:  Physical Exam:  Gen: NAD, comfortable in exam room  Left shoulder: No swelling, ecchymoses.  No gross deformity. No TTP. FROM with painful arc. Positive Hawkins, Neers. Negative Yergasons. Strength 5/5 with empty can and resisted internal/external rotation.  Pain empty can. Negative apprehension. NV intact distally.  Right shoulder: FROM without pain.  Back: No gross deformity, scoliosis. TTP mildly bilateral paraspinal lumbar regions.  No midline or bony TTP. FROM. Strength LEs 5/5 all muscle groups.   Trace MSRs in patellar and 2+ achilles tendons, equal bilaterally. Negative SLRs. Sensation intact to light touch bilaterally. Negative logroll bilateral hips Negative fabers and piriformis stretches.   Assessment & Plan:  1. Left shoulder pain - 2/2 rotator cuff impingement.  Shown home exercises to do daily.  She will use topical medications - she will be taking plavix and aspirin leading up to PFO closure in a week so will avoid NSAIDs.  Consider physical therapy, injection, nitro patches if not improving.  F/u in 6 weeks.  2. Low back pain - 2/2 arthritis, lumbar spasms/strain.  Discussed tylenol, glucosamine, topical medications.  Encouraged home exercises, use of back brace.  Consider physical therapy again.  Do not think repeating imaging would be beneficial.

## 2016-06-27 NOTE — Assessment & Plan Note (Signed)
2/2 rotator cuff impingement.  Shown home exercises to do daily.  She will use topical medications - she will be taking plavix and aspirin leading up to PFO closure in a week so will avoid NSAIDs.  Consider physical therapy, injection, nitro patches if not improving.  F/u in 6 weeks.

## 2016-06-27 NOTE — Patient Instructions (Signed)
Your back pain is due to arthritis and lumbar spasms. These are the different medicines you can take for this: Tylenol 500mg  1-2 tabs three times a day for pain. Glucosamine sulfate 750mg  twice a day is a supplement that may help. Capsaicin, aspercreme, or biofreeze topically up to four times a day may also help with pain. It's important that you continue to stay active. Back brace for support especially at work. Consider repeating physical therapy to strengthen muscles - this usually reduces pain and spasms.   Heat or ice 15 minutes at a time 3-4 times a day as needed to help with pain.  You have rotator cuff impingement Try to avoid painful activities (overhead activities, lifting with extended arm) as much as possible. Use topical medication as you have been. Can take tylenol in addition to this. Subacromial injection may be beneficial to help with pain and to decrease inflammation. Consider physical therapy with transition to home exercise program. Do home exercise program with theraband and scapular stabilization exercises daily - these are very important for long term relief even if an injection was given.  3 sets of 10 once a day If not improving at follow-up we will consider further imaging, injection, physical therapy, and/or nitro patches.  Follow up with me in 6 weeks.

## 2016-06-30 ENCOUNTER — Encounter: Payer: Self-pay | Admitting: Family Medicine

## 2016-06-30 DIAGNOSIS — M7502 Adhesive capsulitis of left shoulder: Secondary | ICD-10-CM | POA: Insufficient documentation

## 2016-06-30 NOTE — Assessment & Plan Note (Signed)
Flared the last few days. Will try Aciphex and Zantac, given Carafate to use prn. Avoid offending foods, is starting Plavix so needs a medication that does not interact

## 2016-06-30 NOTE — Assessment & Plan Note (Signed)
Is scheduled to have it closed on 12/6 and has not started her Plavix, she agrees to after discussion today

## 2016-06-30 NOTE — Assessment & Plan Note (Signed)
Avoid offending foods, start probiotics. Do not eat large meals in late evening and consider raising head of bed.  

## 2016-06-30 NOTE — Assessment & Plan Note (Addendum)
Encouraged moist heat as tolerated. May try NSAIDs and prescription meds as directed and report if symptoms worsen or seek immediate care. Topical treatments and referred to sports med for further treatment. She has been using CBD oil

## 2016-06-30 NOTE — Progress Notes (Signed)
Patient ID: Melinda Kirk, female   DOB: 31-Aug-1970, 45 y.o.   MRN: [id number]    Subjective:    Patient ID: Melinda Kirk, female    DOB: 07-20-1971, 45 y.o.   MRN: [id number]  Chief Complaint  Patient presents with  . Heartburn    HPI Patient is in today for follow up. She is noting a significant flare of her heartburn symptoms lately. She stopped her PPI in preparation for starting her Plavix. She needs the Plavix because she is scheduled to have her PFO repaired on 12/6. Since stopping it she has epigsatric discomfort, nausea, dyspepsia and even dysphagia. No fevers or chills. No change in bowel habits. She has been using CBD oil for her chronic pain including her left shoulder pain which continues to bother her. She also notes other joint pains and back pain, no falls or recent injury. Denies CP/palp/SOB/HA/congestion/fevers or GU c/o. Taking meds as prescribed  Past Medical History:  Diagnosis Date  . Anemia 07/08/2015  . Breast calcification, left 07/29/04  . GERD (gastroesophageal reflux disease)   . Headache(784.0) 01/03/2013  . Menorrhagia 03/07/2013  . Migraine 01/03/2013  . Migraine headache with aura 01/03/2013  . Other and unspecified hyperlipidemia 02/08/2013  . Palpitations 04/02/2016  . Preventative health care 01/22/2011  . Thyroid nodule    s/p FNA- 4/09- benign  . Thyroid nodule 01/15/2011   H/o thyroid nodule   . Unspecified hypothyroidism 01/15/2011  . Vitamin D deficiency 03/08/2011    Past Surgical History:  Procedure Laterality Date  . BIOPSY THYROID  10/2007   benign biopsy of thyroid nodules.  . TEE WITHOUT CARDIOVERSION N/A 02/10/2014   Procedure: TRANSESOPHAGEAL ECHOCARDIOGRAM (TEE);  Surgeon: [name], MD;  Location: [facility] ENDOSCOPY;  Service: Cardiovascular;  Laterality: N/A;    Family History  Problem Relation Age of Onset  . Stroke Mother   . Arthritis Mother   . Heart disease Father     CHF?    Social History   Social History  . Marital status:  Married    Spouse name: N/A  . Number of children: 3  . Years of education: N/A   Occupational History  . Nurse    Social History Main Topics  . Smoking status: Former Smoker    Years: 5.00  . Smokeless tobacco: Never Used  . Alcohol use Yes     Comment: occasional  . Drug use: No  . Sexual activity: Not on file   Other Topics Concern  . Not on file   Social History Narrative   Regular exercise:  No   Caffeine: 1-2 cups coffee / tea daily.   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    04/05/2015 last update       Outpatient Medications Prior to Visit  Medication Sig Dispense Refill  . ALPRAZolam (XANAX) 0.25 MG tablet Take 1 tablet (0.25 mg total) by mouth 3 (three) times daily as needed for anxiety or sleep. 30 tablet 1  . aspirin EC 81 MG tablet Take 81 mg by mouth daily.    . B Complex-Biotin-FA (VITAMIN B50 COMPLEX PO) Take 1 tablet by mouth daily.    . Calcium Carbonate-Vitamin D (CALTRATE 600+D PO) Take 1 tablet by mouth daily.    . Cholecalciferol 2000 UNITS CAPS Take 1 capsule by mouth daily.     . clopidogrel (PLAVIX) 75 MG tablet Take 1 tablet (75 mg  total) by mouth daily. 90 tablet 3  . ferrous sulfate 325 (65 FE) MG tablet Take 325 mg by mouth daily with breakfast.    . hyoscyamine (LEVSIN SL) 0.125 MG SL tablet Place 0.125 mg under the tongue every 4 (four) hours as needed (acid reflux).    . meclizine (ANTIVERT) 25 MG tablet Take 1 tablet (25 mg total) by mouth 3 (three) times daily as needed for dizziness. 30 tablet 0  . Multiple Vitamins-Minerals (MULTIVITAMIN WITH MINERALS) tablet Take 1 tablet by mouth daily.    . naproxen sodium (ALEVE) 220 MG tablet Take 440 mg by mouth daily as needed (headache).    . Omega-3 Fatty Acids (EQL OMEGA 3 FISH OIL) 1400 MG CAPS Take 1,400 mg by mouth daily.     . Probiotic Product (PROBIOTIC ADVANCED) CAPS Take 1 capsule by mouth daily.    . ranitidine (ZANTAC) 300 MG tablet TAKE  1 TABLET (300 MG TOTAL) BY MOUTH AT BEDTIME. 30 tablet 1  . Turmeric 450 MG CAPS Take 1 capsule by mouth daily.     . vitamin C (ASCORBIC ACID) 500 MG tablet Take 500 mg by mouth daily.     No facility-administered medications prior to visit.     No Known Allergies  Review of Systems  Constitutional: Negative for fever and malaise/fatigue.  HENT: Negative for congestion.   Eyes: Negative for blurred vision.  Respiratory: Negative for shortness of breath.   Cardiovascular: Negative for chest pain, palpitations and leg swelling.  Gastrointestinal: Positive for abdominal pain, heartburn and nausea. Negative for blood in stool, constipation, diarrhea and melena.  Genitourinary: Negative for dysuria and frequency.  Musculoskeletal: Positive for back pain and joint pain. Negative for falls.  Skin: Negative for rash.  Neurological: Positive for headaches. Negative for dizziness and loss of consciousness.  Endo/Heme/Allergies: Negative for environmental allergies.  Psychiatric/Behavioral: Negative for depression. The patient is not nervous/anxious.        Objective:    Physical Exam  BP 106/70 (BP Location: Left Arm, Patient Position: Sitting, Cuff Size: Normal)   Pulse 77   Temp 98.4 F (36.9 C) (Oral)   Wt 132 lb 12.8 oz (60.2 kg)   SpO2 97%   BMI 23.16 kg/m  Wt Readings from Last 3 Encounters:  06/27/16 131 lb (59.4 kg)  06/18/16 132 lb 12.8 oz (60.2 kg)  06/13/16 131 lb 12.8 oz (59.8 kg)     Lab Results  Component Value Date   WBC 7.1 04/02/2016   HGB 12.8 04/02/2016   HCT 37.8 04/02/2016   PLT 184.0 04/02/2016   GLUCOSE 91 04/02/2016   CHOL 186 01/26/2015   TRIG 77.0 01/26/2015   HDL 60.00 01/26/2015   LDLCALC 111 (H) 01/26/2015   ALT 14 04/02/2016   AST 17 04/02/2016   NA 140 04/02/2016   K 3.9 04/02/2016   CL 105 04/02/2016   CREATININE 0.67 04/02/2016   BUN 12 04/02/2016   CO2 29 04/02/2016   TSH 0.89 03/13/2016   INR 0.9 02/04/2014    Lab Results    Component Value Date   TSH 0.89 03/13/2016   Lab Results  Component Value Date   WBC 7.1 04/02/2016   HGB 12.8 04/02/2016   HCT 37.8 04/02/2016   MCV 87.1 04/02/2016   PLT 184.0 04/02/2016   Lab Results  Component Value Date   NA 140 04/02/2016   K 3.9 04/02/2016   CO2 29 04/02/2016   GLUCOSE 91 04/02/2016   BUN 12  04/02/2016   CREATININE 0.67 04/02/2016   BILITOT 0.5 04/02/2016   ALKPHOS 52 04/02/2016   AST 17 04/02/2016   ALT 14 04/02/2016   PROT 7.6 04/02/2016   ALBUMIN 4.5 04/02/2016   CALCIUM 9.4 04/02/2016   ANIONGAP 8 03/05/2016   GFR 100.58 04/02/2016   Lab Results  Component Value Date   CHOL 186 01/26/2015   Lab Results  Component Value Date   HDL 60.00 01/26/2015   Lab Results  Component Value Date   LDLCALC 111 (H) 01/26/2015   Lab Results  Component Value Date   TRIG 77.0 01/26/2015   Lab Results  Component Value Date   CHOLHDL 3 01/26/2015   No results found for: HGBA1C     Assessment & Plan:   Problem List Items Addressed This Visit    Migraine    Encouraged increased hydration, 64 ounces of clear fluids daily. Minimize alcohol and caffeine. Eat small frequent meals with lean proteins and complex carbs. Avoid high and low blood sugars. Get adequate sleep, 7-8 hours a night. Needs exercise daily preferably in the morning.      Hyperlipidemia, mixed   Relevant Orders   Lipid panel   COMPLETE METABOLIC PANEL WITH GFR   GERD (gastroesophageal reflux disease)    Avoid offending foods, start probiotics. Do not eat large meals in late evening and consider raising head of bed.       Relevant Medications   sucralfate (CARAFATE) 1 GM/10ML suspension   RABEprazole (ACIPHEX) 20 MG tablet   PFO (patent foramen ovale)    Is scheduled to have it closed on 12/6 and has not started her Plavix, she agrees to after discussion today      Low back pain   Relevant Orders   Ambulatory referral to Sports Medicine   Dysphagia    Flared the last  few days. Will try Aciphex and Zantac, given Carafate to use prn. Avoid offending foods, is starting Plavix so needs a medication that does not interact      Pain in joint of left shoulder - Primary    Encouraged moist heat as tolerated. May try NSAIDs and prescription meds as directed and report if symptoms worsen or seek immediate care. Topical treatments and referred to sports med for further treatment. She has been using CBD oil      Relevant Orders   Ambulatory referral to Sports Medicine      I have discontinued Ms. Ruan's omeprazole-sodium bicarbonate. I am also having her start on sucralfate and RABEprazole. Additionally, I am having her maintain her Calcium Carbonate-Vitamin D (CALTRATE 600+D PO), aspirin EC, ferrous sulfate, vitamin C, EQL OMEGA 3 FISH OIL, naproxen sodium, multivitamin with minerals, B Complex-Biotin-FA (VITAMIN B50 COMPLEX PO), PROBIOTIC ADVANCED, Turmeric, Cholecalciferol, meclizine, ranitidine, ALPRAZolam, hyoscyamine, and clopidogrel.  Meds ordered this encounter  Medications  . DISCONTD: omeprazole-sodium bicarbonate (ZEGERID) 40-1100 MG capsule    Sig: Take 1 capsule by mouth daily before breakfast.    Dispense:  30 capsule    Refill:  2  . sucralfate (CARAFATE) 1 GM/10ML suspension    Sig: Take 10 mLs (1 g total) by mouth 4 (four) times daily -  with meals and at bedtime.    Dispense:  420 mL    Refill:  0  . RABEprazole (ACIPHEX) 20 MG tablet    Sig: Take 1 tablet (20 mg total) by mouth daily.    Dispense:  30 tablet    Refill:  3  [name], MD

## 2016-06-30 NOTE — Assessment & Plan Note (Signed)
Encouraged increased hydration, 64 ounces of clear fluids daily. Minimize alcohol and caffeine. Eat small frequent meals with lean proteins and complex carbs. Avoid high and low blood sugars. Get adequate sleep, 7-8 hours a night. Needs exercise daily preferably in the morning.  

## 2016-07-03 ENCOUNTER — Ambulatory Visit (HOSPITAL_COMMUNITY)
Admission: RE | Admit: 2016-07-03 | Discharge: 2016-07-04 | Disposition: A | Discharge status: Home / Self Care | Payer: 59 | Source: Ambulatory Visit | Attending: Cardiovascular Disease | Admitting: Cardiovascular Disease

## 2016-07-03 ENCOUNTER — Encounter (HOSPITAL_COMMUNITY)
Admission: RE | Disposition: A | Discharge status: Home / Self Care | Payer: Self-pay | Source: Ambulatory Visit | Attending: Cardiovascular Disease

## 2016-07-03 ENCOUNTER — Encounter (HOSPITAL_COMMUNITY): Payer: Self-pay | Admitting: General Practice

## 2016-07-03 DIAGNOSIS — Z7982 Long term (current) use of aspirin: Secondary | ICD-10-CM | POA: Insufficient documentation

## 2016-07-03 DIAGNOSIS — E785 Hyperlipidemia, unspecified: Secondary | ICD-10-CM | POA: Insufficient documentation

## 2016-07-03 DIAGNOSIS — Z8774 Personal history of (corrected) congenital malformations of heart and circulatory system: Secondary | ICD-10-CM | POA: Diagnosis not present

## 2016-07-03 DIAGNOSIS — E039 Hypothyroidism, unspecified: Secondary | ICD-10-CM | POA: Insufficient documentation

## 2016-07-03 DIAGNOSIS — G43109 Migraine with aura, not intractable, without status migrainosus: Secondary | ICD-10-CM | POA: Diagnosis not present

## 2016-07-03 DIAGNOSIS — Z87891 Personal history of nicotine dependence: Secondary | ICD-10-CM | POA: Insufficient documentation

## 2016-07-03 DIAGNOSIS — Z8249 Family history of ischemic heart disease and other diseases of the circulatory system: Secondary | ICD-10-CM | POA: Diagnosis not present

## 2016-07-03 DIAGNOSIS — Q211 Atrial septal defect: Secondary | ICD-10-CM | POA: Insufficient documentation

## 2016-07-03 DIAGNOSIS — R002 Palpitations: Secondary | ICD-10-CM | POA: Insufficient documentation

## 2016-07-03 DIAGNOSIS — D649 Anemia, unspecified: Secondary | ICD-10-CM | POA: Insufficient documentation

## 2016-07-03 DIAGNOSIS — E559 Vitamin D deficiency, unspecified: Secondary | ICD-10-CM | POA: Diagnosis not present

## 2016-07-03 DIAGNOSIS — Z823 Family history of stroke: Secondary | ICD-10-CM | POA: Insufficient documentation

## 2016-07-03 DIAGNOSIS — K219 Gastro-esophageal reflux disease without esophagitis: Secondary | ICD-10-CM | POA: Diagnosis not present

## 2016-07-03 DIAGNOSIS — Z8673 Personal history of transient ischemic attack (TIA), and cerebral infarction without residual deficits: Secondary | ICD-10-CM | POA: Diagnosis not present

## 2016-07-03 DIAGNOSIS — Q2112 Patent foramen ovale: Secondary | ICD-10-CM

## 2016-07-03 DIAGNOSIS — Z7902 Long term (current) use of antithrombotics/antiplatelets: Secondary | ICD-10-CM | POA: Insufficient documentation

## 2016-07-03 DIAGNOSIS — I10 Essential (primary) hypertension: Secondary | ICD-10-CM | POA: Insufficient documentation

## 2016-07-03 DIAGNOSIS — I639 Cerebral infarction, unspecified: Secondary | ICD-10-CM | POA: Diagnosis present

## 2016-07-03 HISTORY — DX: Atrial septal defect: Q21.1

## 2016-07-03 HISTORY — DX: Cerebral infarction, unspecified: I63.9

## 2016-07-03 HISTORY — PX: PATENT FORAMEN OVALE CLOSURE: SHX2181

## 2016-07-03 HISTORY — DX: Patent foramen ovale: Q21.12

## 2016-07-03 LAB — BASIC METABOLIC PANEL
ANION GAP: 5 (ref 5–15)
BUN: 9 mg/dL (ref 6–20)
CALCIUM: 9.5 mg/dL (ref 8.9–10.3)
CO2: 28 mmol/L (ref 22–32)
Chloride: 106 mmol/L (ref 101–111)
Creatinine, Ser: 0.47 mg/dL (ref 0.44–1.00)
GLUCOSE: 83 mg/dL (ref 65–99)
POTASSIUM: 4 mmol/L (ref 3.5–5.1)
Sodium: 139 mmol/L (ref 135–145)

## 2016-07-03 LAB — PROTIME-INR
INR: 0.98
PROTHROMBIN TIME: 13 s (ref 11.4–15.2)

## 2016-07-03 LAB — PREGNANCY, URINE: PREG TEST UR: NEGATIVE

## 2016-07-03 LAB — CBC
HCT: 39.2 % (ref 36.0–46.0)
Hemoglobin: 13 g/dL (ref 12.0–15.0)
MCH: 29.8 pg (ref 26.0–34.0)
MCHC: 33.2 g/dL (ref 30.0–36.0)
MCV: 89.9 fL (ref 78.0–100.0)
PLATELETS: 188 10*3/uL (ref 150–400)
RBC: 4.36 MIL/uL (ref 3.87–5.11)
RDW: 12.6 % (ref 11.5–15.5)
WBC: 6.2 10*3/uL (ref 4.0–10.5)

## 2016-07-03 LAB — POCT ACTIVATED CLOTTING TIME
ACTIVATED CLOTTING TIME: 230 s
ACTIVATED CLOTTING TIME: 175 s
Activated Clotting Time: 241 seconds

## 2016-07-03 SURGERY — PATENT FORAMEN OVALE (PFO) CLOSURE

## 2016-07-03 MED ORDER — SODIUM CHLORIDE 0.9% FLUSH: 3.0000 mL | Freq: Two times a day (BID) | INTRAVENOUS | Status: DC

## 2016-07-03 MED ORDER — SODIUM CHLORIDE 0.9% FLUSH
3.0000 mL | Freq: Two times a day (BID) | INTRAVENOUS | Status: DC
Start: 2016-07-03 — End: 2016-07-03

## 2016-07-03 MED ORDER — OXYCODONE-ACETAMINOPHEN 5-325 MG PO TABS: 1.0000 | ORAL_TABLET | ORAL | Status: DC | PRN

## 2016-07-03 MED ORDER — MIDAZOLAM HCL 2 MG/2ML IJ SOLN
INTRAMUSCULAR | Status: AC
  Filled 2016-07-03: qty 2

## 2016-07-03 MED ORDER — CLOPIDOGREL BISULFATE 75 MG PO TABS
75.0000 mg | ORAL_TABLET | Freq: Every day | ORAL | Status: DC
  Administered 2016-07-04: 10:00:00 75 mg via ORAL
  Filled 2016-07-03: qty 1

## 2016-07-03 MED ORDER — ASPIRIN 81 MG PO CHEW: 81.0000 mg | CHEWABLE_TABLET | ORAL | Status: DC

## 2016-07-03 MED ORDER — SODIUM CHLORIDE 0.9% FLUSH: 3.0000 mL | INTRAVENOUS | Status: DC | PRN

## 2016-07-03 MED ORDER — FENTANYL CITRATE (PF) 100 MCG/2ML IJ SOLN
INTRAMUSCULAR | Status: AC
Start: 2016-07-03 — End: 2016-07-03
  Filled 2016-07-03: qty 2

## 2016-07-03 MED ORDER — SODIUM CHLORIDE 0.9 % WEIGHT BASED INFUSION
3.0000 mL/kg/h | INTRAVENOUS | Status: DC
  Administered 2016-07-03: 3 mL/kg/h via INTRAVENOUS

## 2016-07-03 MED ORDER — TURMERIC 450 MG PO CAPS
450.0000 mg | ORAL_CAPSULE | Freq: Every day | ORAL | Status: DC
Start: 2016-07-03 — End: 2016-07-03

## 2016-07-03 MED ORDER — FAMOTIDINE 20 MG PO TABS
20.0000 mg | ORAL_TABLET | Freq: Every day | ORAL | Status: DC
  Administered 2016-07-03: 20 mg via ORAL
  Filled 2016-07-03: qty 1

## 2016-07-03 MED ORDER — SUCRALFATE 1 GM/10ML PO SUSP
1.0000 g | Freq: Three times a day (TID) | ORAL | Status: DC
  Administered 2016-07-03 (×2): 1 g via ORAL
  Filled 2016-07-03 (×4): qty 10

## 2016-07-03 MED ORDER — MECLIZINE HCL 25 MG PO TABS
25.0000 mg | ORAL_TABLET | Freq: Three times a day (TID) | ORAL | Status: DC | PRN
  Filled 2016-07-03: qty 1

## 2016-07-03 MED ORDER — CEFAZOLIN IN D5W 1 GM/50ML IV SOLN
1.0000 g | INTRAVENOUS | Status: AC
  Administered 2016-07-03: 1 g via INTRAVENOUS
  Filled 2016-07-03: qty 50

## 2016-07-03 MED ORDER — SODIUM CHLORIDE 0.9 % IV SOLN: 250.0000 mL | INTRAVENOUS | Status: DC | PRN

## 2016-07-03 MED ORDER — HEPARIN SODIUM (PORCINE) 1000 UNIT/ML IJ SOLN
INTRAMUSCULAR | Status: DC | PRN
  Administered 2016-07-03: 2000 [IU] via INTRAVENOUS
  Administered 2016-07-03: 5000 [IU] via INTRAVENOUS

## 2016-07-03 MED ORDER — SODIUM CHLORIDE 0.9 % WEIGHT BASED INFUSION: 1.0000 mL/kg/h | INTRAVENOUS | Status: DC

## 2016-07-03 MED ORDER — HEPARIN SODIUM (PORCINE) 1000 UNIT/ML IJ SOLN
INTRAMUSCULAR | Status: AC
  Filled 2016-07-03: qty 1

## 2016-07-03 MED ORDER — FENTANYL CITRATE (PF) 100 MCG/2ML IJ SOLN
INTRAMUSCULAR | Status: DC | PRN
  Administered 2016-07-03 (×4): 25 ug via INTRAVENOUS

## 2016-07-03 MED ORDER — SODIUM CHLORIDE 0.9 % IV SOLN
INTRAVENOUS | Status: AC
  Administered 2016-07-03: 15:00:00 via INTRAVENOUS

## 2016-07-03 MED ORDER — PROBIOTIC ADVANCED PO CAPS: 1.0000 | ORAL_CAPSULE | Freq: Every day | ORAL | Status: DC

## 2016-07-03 MED ORDER — RISAQUAD PO CAPS
1.0000 | ORAL_CAPSULE | Freq: Every day | ORAL | Status: DC
  Filled 2016-07-03: qty 1

## 2016-07-03 MED ORDER — HYOSCYAMINE SULFATE 0.125 MG SL SUBL
0.1250 mg | SUBLINGUAL_TABLET | SUBLINGUAL | Status: DC | PRN
  Filled 2016-07-03: qty 1

## 2016-07-03 MED ORDER — LIDOCAINE HCL (PF) 1 % IJ SOLN
INTRAMUSCULAR | Status: AC
  Filled 2016-07-03: qty 30

## 2016-07-03 MED ORDER — FERROUS SULFATE 325 (65 FE) MG PO TABS
325.0000 mg | ORAL_TABLET | Freq: Every day | ORAL | Status: DC
  Filled 2016-07-03: qty 1

## 2016-07-03 MED ORDER — MIDAZOLAM HCL 2 MG/2ML IJ SOLN
INTRAMUSCULAR | Status: DC | PRN
  Administered 2016-07-03 (×2): 1 mg via INTRAVENOUS
  Administered 2016-07-03: 2 mg via INTRAVENOUS
  Administered 2016-07-03: 1 mg via INTRAVENOUS

## 2016-07-03 MED ORDER — MULTI-VITAMIN/MINERALS PO TABS: 1.0000 | ORAL_TABLET | Freq: Every day | ORAL | Status: DC

## 2016-07-03 MED ORDER — PANTOPRAZOLE SODIUM 40 MG PO TBEC
40.0000 mg | DELAYED_RELEASE_TABLET | Freq: Every day | ORAL | Status: DC
  Filled 2016-07-03: qty 1

## 2016-07-03 MED ORDER — CHOLECALCIFEROL 50 MCG (2000 UT) PO CAPS: 2000.0000 [IU] | ORAL_CAPSULE | Freq: Every day | ORAL | Status: DC

## 2016-07-03 MED ORDER — ASPIRIN EC 81 MG PO TBEC
81.0000 mg | DELAYED_RELEASE_TABLET | Freq: Every day | ORAL | Status: DC
  Administered 2016-07-04: 10:00:00 81 mg via ORAL
  Filled 2016-07-03: qty 1

## 2016-07-03 MED ORDER — ONDANSETRON HCL 4 MG/2ML IJ SOLN: 4.0000 mg | Freq: Four times a day (QID) | INTRAMUSCULAR | Status: DC | PRN

## 2016-07-03 MED ORDER — HEPARIN (PORCINE) IN NACL 2-0.9 UNIT/ML-% IJ SOLN
INTRAMUSCULAR | Status: DC | PRN
  Administered 2016-07-03: 1000 mL

## 2016-07-03 MED ORDER — ACETAMINOPHEN 325 MG PO TABS
650.0000 mg | ORAL_TABLET | ORAL | Status: DC | PRN
  Administered 2016-07-03: 18:00:00 650 mg via ORAL
  Filled 2016-07-03: qty 2

## 2016-07-03 MED ORDER — LIDOCAINE HCL (PF) 1 % IJ SOLN
INTRAMUSCULAR | Status: DC | PRN
  Administered 2016-07-03: 20 mL via INTRADERMAL

## 2016-07-03 MED ORDER — VITAMIN D 1000 UNITS PO TABS
2000.0000 [IU] | ORAL_TABLET | Freq: Every day | ORAL | Status: DC
  Filled 2016-07-03: qty 2

## 2016-07-03 MED ORDER — ALPRAZOLAM 0.25 MG PO TABS: 0.2500 mg | ORAL_TABLET | Freq: Three times a day (TID) | ORAL | Status: DC | PRN

## 2016-07-03 MED ORDER — HEPARIN (PORCINE) IN NACL 2-0.9 UNIT/ML-% IJ SOLN
INTRAMUSCULAR | Status: AC
  Filled 2016-07-03: qty 1000

## 2016-07-03 MED ORDER — ADULT MULTIVITAMIN W/MINERALS CH
1.0000 | ORAL_TABLET | Freq: Every day | ORAL | Status: DC
  Filled 2016-07-03: qty 1

## 2016-07-03 MED ORDER — SODIUM CHLORIDE 0.9 % IV SOLN
INTRAVENOUS | Status: DC | PRN
  Administered 2016-07-03: 999 mL via INTRAVENOUS

## 2016-07-03 SURGICAL SUPPLY — 14 items
CATH ACUNAV 8FR 90CM (CATHETERS) ×2 IMPLANT
CATH SITESEER 5F MULTI A 2 (CATHETERS) ×2 IMPLANT
COVER PRB 48X5XTLSCP FOLD TPE (BAG) ×1 IMPLANT
COVER PROBE 5X48 (BAG) ×1
COVER SWIFTLINK CONNECTOR (BAG) ×2 IMPLANT
GUIDEWIRE AMPLATZER 1.5JX260 (WIRE) ×2 IMPLANT
GUIDEWIRE ANGLED .035X260CM (WIRE) ×2 IMPLANT
OCCLUDER AMPLATZER PFO 25MM (Prosthesis & Implant Heart) ×2 IMPLANT
PACK CARDIAC CATHETERIZATION (CUSTOM PROCEDURE TRAY) ×2 IMPLANT
SHEATH INTROD W/O MIN 9FR 25CM (SHEATH) ×2 IMPLANT
SHEATH PINNACLE 6F 10CM (SHEATH) ×2 IMPLANT
SHEATH PINNACLE 8F 10CM (SHEATH) ×2 IMPLANT
SYSTEM DELIVERY AMPLATZER 8FR (SHEATH) ×2 IMPLANT
WIRE EMERALD 3MM-J .035X150CM (WIRE) ×2 IMPLANT

## 2016-07-03 NOTE — Care Management Note (Signed)
Case Management Note  Patient Details  Name: Melinda Kirk MRN: [id number] Date of Birth: 12-05-1970  Subjective/Objective:     S/p pfo closure, NCM will cont to follow for dc needs.               Action/Plan:   Expected Discharge Date:                  Expected Discharge Plan:  Home/Self Care  In-House Referral:     Discharge planning Services  CM Consult  Post Acute Care Choice:    Choice offered to:     DME Arranged:    DME Agency:     HH Arranged:    HH Agency:     Status of Service:  In process, will continue to follow  If discussed at Long Length of Stay Meetings, dates discussed:    Additional Comments:  Zenon Mayo, RN 07/03/2016, 3:38 PM

## 2016-07-03 NOTE — Interval H&P Note (Signed)
History and Physical Interval Note:  07/03/2016 12:41 PM  Melinda Kirk  has presented today for surgery, with the diagnosis of pfo  The various methods of treatment have been discussed with the patient and family. After consideration of risks, benefits and other options for treatment, the patient has consented to  Procedure(s): PFO  Closure (N/A) as a surgical intervention .  The patient's history has been reviewed, patient examined, no change in status, stable for surgery.  I have reviewed the patient's chart and labs.  Questions were answered to the patient's satisfaction.     [name]

## 2016-07-03 NOTE — Progress Notes (Signed)
Site area: right groin  Site Prior to Removal:  Level 0  Pressure Applied For 25 MINUTES    Minutes Beginning at 1635  Manual:   Yes.    Patient Status During Pull:  stable  Post Pull Groin Site:  Level 0  Post Pull Instructions Given:  Yes.    Post Pull Pulses Present:  Yes.    Dressing Applied:  Yes.    Comments:  Rechecked with no change in assessment noted.

## 2016-07-03 NOTE — H&P (View-Only) (Signed)
Cardiology Office Note Date:  06/13/2016   ID:  Melinda Kirk, DOB 1971-07-15, MRN [id number]  PCP:  [name], MD  Cardiologist:  [name], MD    Chief Complaint  Patient presents with  . PFO    History of Present Illness: Melinda Kirk is a 45 y.o. female who presents for follow-up evaluation. The patient has a history of cryptogenic stroke and she was found to have a PFO on TEE back in 2015. She was evaluated at that time and medical therapy was recommended. Otherwise her left ventricular function was normal. She was recently seen after experiencing acute dyspnea, hypertension, and heart palpitations that occurred at high altitude. At that time she was evaluated in the emergency department and was noted to be hypoxemic with oxygen saturations in the 80s. Symptoms improved with supplemental oxygen.  She is here today to further discuss consideration of PFO closure. The patient has had a strong desire to undergo PFO closure since her initial evaluation. At the time the procedure was not FDA approved, but after new data demonstrated improved secondary stroke risk reduction with PFO closure versus medical therapy, transcatheter PFO closure was improved last year. She was just seen in September after experiencing shortness of breath and hypoxemia at high altitude. The 30 day event monitor demonstrated no sustained arrhythmias. An exercise treadmill study demonstrated no evidence of hypoxemia.  She has no new complaints. No chest pain. Still with occasional palpitations and shortness of breath. No new neurologic symptoms.   Past Medical History:  Diagnosis Date  . Anemia 07/08/2015  . Breast calcification, left 07/29/04  . GERD (gastroesophageal reflux disease)   . Headache(784.0) 01/03/2013  . Menorrhagia 03/07/2013  . Migraine headache with aura 01/03/2013  . Other and unspecified hyperlipidemia 02/08/2013  . Palpitations 04/02/2016  . Preventative health care 01/22/2011  . Thyroid  nodule    s/p FNA- 4/09- benign  . Thyroid nodule 01/15/2011   H/o thyroid nodule   . Unspecified hypothyroidism 01/15/2011  . Vitamin D deficiency 03/08/2011    Past Surgical History:  Procedure Laterality Date  . BIOPSY THYROID  10/2007   benign biopsy of thyroid nodules.  . TEE WITHOUT CARDIOVERSION N/A 02/10/2014   Procedure: TRANSESOPHAGEAL ECHOCARDIOGRAM (TEE);  Surgeon: [name], MD;  Location: [facility] ENDOSCOPY;  Service: Cardiovascular;  Laterality: N/A;    Current Outpatient Prescriptions  Medication Sig Dispense Refill  . ALPRAZolam (XANAX) 0.25 MG tablet Take 1 tablet (0.25 mg total) by mouth 3 (three) times daily as needed for anxiety or sleep. 30 tablet 1  . aspirin EC 81 MG tablet Take 81 mg by mouth daily.    . B Complex-Biotin-FA (VITAMIN B50 COMPLEX PO) Take 1 tablet by mouth daily.    . Calcium Carbonate-Vitamin D (CALTRATE 600+D PO) Take 1 tablet by mouth daily.    . Cholecalciferol 2000 UNITS CAPS Take 1 capsule by mouth daily.     . ferrous sulfate 325 (65 FE) MG tablet Take 325 mg by mouth daily with breakfast.    . hyoscyamine (LEVSIN SL) 0.125 MG SL tablet Place 0.125 mg under the tongue every 4 (four) hours as needed (acid reflux).    . meclizine (ANTIVERT) 25 MG tablet Take 1 tablet (25 mg total) by mouth 3 (three) times daily as needed for dizziness. 30 tablet 0  . Multiple Vitamins-Minerals (MULTIVITAMIN WITH MINERALS) tablet Take 1 tablet by mouth daily.    . naproxen sodium (ALEVE) 220 MG tablet Take 440 mg  by mouth daily as needed (headache).    . Omega-3 Fatty Acids (EQL OMEGA 3 FISH OIL) 1400 MG CAPS Take 1,400 mg by mouth daily.     . Probiotic Product (PROBIOTIC ADVANCED) CAPS Take 1 capsule by mouth daily.    . ranitidine (ZANTAC) 300 MG tablet TAKE 1 TABLET (300 MG TOTAL) BY MOUTH AT BEDTIME. 30 tablet 1  . Turmeric 450 MG CAPS Take 1 capsule by mouth daily.     . vitamin C (ASCORBIC ACID) 500 MG tablet Take 500 mg by mouth daily.    . clopidogrel  (PLAVIX) 75 MG tablet Take 1 tablet (75 mg total) by mouth daily. 90 tablet 3   No current facility-administered medications for this visit.     Allergies:   Patient has no known allergies.   Social History:  The patient  reports that she has quit smoking. She quit after 5.00 years of use. She has never used smokeless tobacco. She reports that she drinks alcohol. She reports that she does not use drugs.   Family History:  The patient's  family history includes Arthritis in her mother; Heart disease in her father; Stroke in her mother.    ROS:  Please see the history of present illness.  Otherwise, review of systems is positive for Back pain, muscle pain.  All other systems are reviewed and negative.    PHYSICAL EXAM: VS:  BP 102/64   Pulse 83   Ht 5' 3.5" (1.613 m)   Wt 131 lb 12.8 oz (59.8 kg)   BMI 22.98 kg/m  , BMI Body mass index is 22.98 kg/m. GEN: Well nourished, well developed, in no acute distress  Exam deferred as our time was spent in discussion and she recently underwent full exam 04-15-2016.  EKG:  EKG is not ordered today.  Recent Labs: 03/13/2016: TSH 0.89 04/02/2016: ALT 14; BUN 12; Creatinine, Ser 0.67; Hemoglobin 12.8; Platelets 184.0; Potassium 3.9; Sodium 140   Lipid Panel     Component Value Date/Time   CHOL 186 01/26/2015 1356   TRIG 77.0 01/26/2015 1356   HDL 60.00 01/26/2015 1356   CHOLHDL 3 01/26/2015 1356   VLDL 15.4 01/26/2015 1356   LDLCALC 111 (H) 01/26/2015 1356      Wt Readings from Last 3 Encounters:  06/13/16 131 lb 12.8 oz (59.8 kg)  04/15/16 132 lb 12.8 oz (60.2 kg)  04/02/16 130 lb 2 oz (59 kg)     Cardiac Studies Reviewed: Event monitor 03/14/2016: Study Highlights   Sinus rhythm throughout  No arrhythmia detected   GXT 04-25-2016: Study Highlights     Blood pressure demonstrated a normal response to exercise.  There was no ST segment deviation noted during stress.  No T wave inversion was noted during stress.      Echo 03-12-2016: Study Conclusions  - Left ventricle: The cavity size was normal. Wall thickness was   normal. Systolic function was normal. The estimated ejection   fraction was in the range of 60% to 65%. Wall motion was normal;   there were no regional wall motion abnormalities. Doppler   parameters are consistent with abnormal left ventricular   relaxation (grade 1 diastolic dysfunction). - Mitral valve: There was mild regurgitation.  ASSESSMENT AND PLAN: PFO with cryptogenic stroke: Again reviewed potential treatment options including continued medical therapy versus transcatheter closure. Available randomized control trial data reviewed with the patient. She strongly desires to proceed with PFO closure. Her TEE from 2015 is reviewed and demonstrates a  moderate size PFO with right-to-left shunt.  I reviewed the risks, indications, and alternatives to transcatheter PFO closure with the patient. Specific risks include bleeding, infection, device embolization, stroke, cardiac perforation, tamponade, arrhythmia, MI, and late device erosion. She understands these serious risks occur at low incidence of < 1%. Also discussed risk of atrial fibrillation up to 5% of the time and this generally is transient. She will start plavix prior to the procedure and understands the need to continue DAPT with ASA and plavix for a period of 6 months.   Current medicines are reviewed with the patient today.  The patient does not have concerns regarding medicines.  Labs/ tests ordered today include:   Orders Placed This Encounter  Procedures  . ECHOCARDIOGRAM LIMITED   Disposition:  Plans for transcatheter PFO closure as above  Signed, [name], MD  06/13/2016 1:41 PM    [facility] [address], [place], [place]  [address] Phone: [phone]; Fax: [phone]

## 2016-07-04 ENCOUNTER — Encounter (HOSPITAL_COMMUNITY): Payer: Self-pay | Admitting: Cardiovascular Disease

## 2016-07-04 ENCOUNTER — Ambulatory Visit (HOSPITAL_BASED_OUTPATIENT_CLINIC_OR_DEPARTMENT_OTHER): Payer: 59

## 2016-07-04 DIAGNOSIS — Z7982 Long term (current) use of aspirin: Secondary | ICD-10-CM | POA: Diagnosis not present

## 2016-07-04 DIAGNOSIS — I10 Essential (primary) hypertension: Secondary | ICD-10-CM | POA: Diagnosis not present

## 2016-07-04 DIAGNOSIS — E785 Hyperlipidemia, unspecified: Secondary | ICD-10-CM | POA: Diagnosis not present

## 2016-07-04 DIAGNOSIS — Z8774 Personal history of (corrected) congenital malformations of heart and circulatory system: Secondary | ICD-10-CM | POA: Diagnosis not present

## 2016-07-04 DIAGNOSIS — Z8673 Personal history of transient ischemic attack (TIA), and cerebral infarction without residual deficits: Secondary | ICD-10-CM | POA: Diagnosis not present

## 2016-07-04 DIAGNOSIS — I639 Cerebral infarction, unspecified: Secondary | ICD-10-CM

## 2016-07-04 DIAGNOSIS — Q211 Atrial septal defect: Secondary | ICD-10-CM | POA: Diagnosis not present

## 2016-07-04 DIAGNOSIS — K219 Gastro-esophageal reflux disease without esophagitis: Secondary | ICD-10-CM | POA: Diagnosis not present

## 2016-07-04 DIAGNOSIS — R002 Palpitations: Secondary | ICD-10-CM | POA: Diagnosis not present

## 2016-07-04 DIAGNOSIS — Z7902 Long term (current) use of antithrombotics/antiplatelets: Secondary | ICD-10-CM | POA: Diagnosis not present

## 2016-07-04 LAB — ECHOCARDIOGRAM COMPLETE
Height: 63.5 in
WEIGHTICAEL: 2081.14 [oz_av]

## 2016-07-04 NOTE — Discharge Summary (Signed)
Discharge Summary    Patient ID: Melinda Kirk,  MRN: [id number], DOB/AGE: [birth year] 45 y.o.  Admit date: 07/03/2016 Discharge date: 07/04/2016  Primary Care Provider: [name] Primary Cardiologist: Dr. Burt Knack   Discharge Diagnoses    Active Problems:   Cryptogenic stroke [facility])   PFO (patent foramen ovale)   Allergies No Known Allergies   History of Present Illness     Melinda Kirk is a 45 y.o. female with a history of cryptogenic CVA and PFO who presented to [facility] on 07/03/16 for planned PFO closure.   The patient has a history of cryptogenic stroke and she was found to have a moderate sized PFO with a R-> L shunt on TEE back in 2015. She was evaluated at that time and medical therapy was recommended. Otherwise her left ventricular function was normal. She was recently seen after experiencing acute dyspnea, hypertension, and heart palpitations that occurred at high altitude. At that time she was evaluated in the emergency department and was noted to be hypoxemic with oxygen saturations in the 80s. Symptoms improved with supplemental oxygen.  A 30 day event monitor demonstrated no sustained arrhythmias and an exercise treadmill study demonstrated no evidence of hypoxemia.  She was seen by Dr. Burt Knack in the office on 05/1616 for evaluation of PFO closure. The patient had a strong desire to undergo the procedure and it was scheduled for 07/03/16.   Hospital Course     Consultants: none   Cryptogenic CVA and PFO: s/p successful transcatheter PFO closure using a 25 mm Amplatzer PFO occluder. 2D ECHO with bubble study today showed normally placed Amplatzer device, no flow with color Doppler. No bubble study ordered, so this was done later which was negative. Continue ASA/Plavix for 6 months.    The patient has had an uncomplicated hospital course and is recovering well. The femoral catheter site is stable. She has been seen by Dr. Meda Coffee today and deemed ready for discharge  home. All follow-up appointments have been scheduled. Discharge medications are listed below.  _____________  Discharge Vitals Blood pressure 105/68, pulse 84, temperature 97.7 F (36.5 C), temperature source Oral, resp. rate 20, height 5' 3.5" (1.613 m), weight 130 lb 1.1 oz (59 kg), SpO2 97 %.  Filed Weights   07/03/16 1128 07/04/16 0623  Weight: 131 lb (59.4 kg) 130 lb 1.1 oz (59 kg)    Labs & Radiologic Studies     CBC  Recent Labs  07/03/16 1140  WBC 6.2  HGB 13.0  HCT 39.2  MCV 89.9  PLT 0000000   Basic Metabolic Panel  Recent Labs  07/03/16 1140  NA 139  K 4.0  CL 106  CO2 28  GLUCOSE 83  BUN 9  CREATININE 0.47  CALCIUM 9.5   Liver Function Tests No results for input(s): AST, ALT, ALKPHOS, BILITOT, PROT, ALBUMIN in the last 72 hours. No results for input(s): LIPASE, AMYLASE in the last 72 hours. Cardiac Enzymes No results for input(s): CKTOTAL, CKMB, CKMBINDEX, TROPONINI in the last 72 hours. BNP Invalid input(s): POCBNP D-Dimer No results for input(s): DDIMER in the last 72 hours. Hemoglobin A1C No results for input(s): HGBA1C in the last 72 hours. Fasting Lipid Panel No results for input(s): CHOL, HDL, LDLCALC, TRIG, CHOLHDL, LDLDIRECT in the last 72 hours. Thyroid Function Tests No results for input(s): TSH, T4TOTAL, T3FREE, THYROIDAB in the last 72 hours.  Invalid input(s): FREET3  No results found.   Diagnostic Studies/Procedures    07/03/16  Procedures  PFO Closure  Conclusion  Successful transcatheter PFO closure using a 25 mm Amplatzer PFO occluder  Indications  PFO (patent foramen ovale) [Q21.1 (ICD-10-CM)]   _____________    Disposition   Pt is being discharged home today in good condition.  Follow-up Plans & Appointments    Follow-up Information    [name], MD Follow up on 08/02/2016.   Specialty:  Cardiology Why:  @ 9:30 for an ultrasound of your heart followed by an appointment with Dr. Burt Knack. Please arrive at  least 15 minutes early.  Contact information: [id number] [address] [phone]            Discharge Medications     Medication List    TAKE these medications   ALEVE 220 MG tablet Generic drug:  naproxen sodium Take 440 mg by mouth daily as needed (headache).   ALPRAZolam 0.25 MG tablet Commonly known as:  XANAX Take 1 tablet (0.25 mg total) by mouth 3 (three) times daily as needed for anxiety or sleep.   aspirin EC 81 MG tablet Take 81 mg by mouth daily.   CALTRATE 600+D PO Take 1 tablet by mouth daily.   Cholecalciferol 2000 units Caps Take 2,000 Units by mouth daily.   clopidogrel 75 MG tablet Commonly known as:  PLAVIX Take 1 tablet (75 mg total) by mouth daily.   EQL OMEGA 3 FISH OIL 1400 MG Caps Take 1,400 mg by mouth daily.   ferrous sulfate 325 (65 FE) MG tablet Take 325 mg by mouth daily with breakfast.   hyoscyamine 0.125 MG SL tablet Commonly known as:  LEVSIN SL Place 0.125 mg under the tongue every 4 (four) hours as needed (acid reflux).   meclizine 25 MG tablet Commonly known as:  ANTIVERT Take 1 tablet (25 mg total) by mouth 3 (three) times daily as needed for dizziness.   multivitamin with minerals tablet Take 1 tablet by mouth daily.   PROBIOTIC ADVANCED Caps Take 1 capsule by mouth daily.   RABEprazole 20 MG tablet Commonly known as:  ACIPHEX Take 1 tablet (20 mg total) by mouth daily.   ranitidine 300 MG tablet Commonly known as:  ZANTAC TAKE 1 TABLET (300 MG TOTAL) BY MOUTH AT BEDTIME.   sucralfate 1 GM/10ML suspension Commonly known as:  CARAFATE Take 10 mLs (1 g total) by mouth 4 (four) times daily -  with meals and at bedtime.   Turmeric 450 MG Caps Take 450 mg by mouth daily.   VITAMIN B50 COMPLEX PO Take 1 tablet by mouth daily.   vitamin C 500 MG tablet Commonly known as:  ASCORBIC ACID Take 500 mg by mouth daily.        Outstanding Labs/Studies   Echo in 1  month  Duration of Discharge Encounter   Greater than 30 minutes including physician time.  Signed, Angelena Form PA-C 07/04/2016, 10:51 AM

## 2016-07-04 NOTE — Care Management Note (Signed)
Case Management Note  Patient Details  Name: RAMAYA ODONOHUE MRN: [id number] Date of Birth: 1970/10/27  Subjective/Objective:   s/p pfo closure, NCM will cont to follow for dc needs.                Action/Plan:   Expected Discharge Date:                  Expected Discharge Plan:  Home/Self Care  In-House Referral:     Discharge planning Services  CM Consult  Post Acute Care Choice:    Choice offered to:     DME Arranged:    DME Agency:     HH Arranged:    HH Agency:     Status of Service:  Completed, signed off  If discussed at [employer] of Stay Meetings, dates discussed:    Additional Comments:  Zenon Mayo, RN 07/04/2016, 11:11 AM

## 2016-07-04 NOTE — Progress Notes (Signed)
Patient Name: Melinda Kirk Date of Encounter: 07/04/2016  Primary Cardiologist: Dr. Tyrell Antonio Problem List     Active Problems:   Cryptogenic stroke [facility])   PFO (patent foramen ovale)   Subjective   Feeling well. No complaints. Ready to go home   Inpatient Medications    Scheduled Meds: . acidophilus  1 capsule Oral Daily  . aspirin EC  81 mg Oral Daily  . cholecalciferol  2,000 Units Oral Daily  . clopidogrel  75 mg Oral Daily  . famotidine  20 mg Oral QHS  . ferrous sulfate  325 mg Oral Q breakfast  . multivitamin with minerals  1 tablet Oral Daily  . pantoprazole  40 mg Oral Daily  . sodium chloride flush  3 mL Intravenous Q12H  . sucralfate  1 g Oral TID WC & HS   Continuous Infusions:  PRN Meds: sodium chloride, acetaminophen, ALPRAZolam, hyoscyamine, meclizine, ondansetron (ZOFRAN) IV, oxyCODONE-acetaminophen, sodium chloride flush   Vital Signs    Vitals:   07/03/16 1800 07/03/16 1900 07/03/16 2000 07/04/16 0623  BP: (!) 103/54 91/61 (!) 98/57 101/60  Pulse: 81 74 74 69  Resp: 16 20 15 15   Temp:   98.1 F (36.7 C) 97.7 F (36.5 C)  TempSrc:   Oral Oral  SpO2: 97% 97% 98% 99%  Weight:    130 lb 1.1 oz (59 kg)  Height:        Intake/Output Summary (Last 24 hours) at 07/04/16 0655 Last data filed at 07/04/16 [id number]  Gross per 24 hour  Intake           828.75 ml  Output              800 ml  Net            28.75 ml   Filed Weights   07/03/16 1128 07/04/16 0623  Weight: 131 lb (59.4 kg) 130 lb 1.1 oz (59 kg)    Physical Exam   GEN: Well nourished, well developed, in no acute distress.  HEENT: Grossly normal.  Neck: Supple, no JVD, carotid bruits, or masses. Cardiac: RRR, no murmurs, rubs, or gallops. No clubbing, cyanosis, edema.  Radials/DP/PT 2+ and equal bilaterally.  Respiratory:  Respirations regular and unlabored, clear to auscultation bilaterally. GI: Soft, nontender, nondistended, BS + x 4. MS: no deformity or atrophy. Skin:  warm and dry, no rash. Neuro:  Strength and sensation are intact. Psych: AAOx3.  Normal affect.  Labs    CBC  Recent Labs  07/03/16 1140  WBC 6.2  HGB 13.0  HCT 39.2  MCV 89.9  PLT 0000000   Basic Metabolic Panel  Recent Labs  07/03/16 1140  NA 139  K 4.0  CL 106  CO2 28  GLUCOSE 83  BUN 9  CREATININE 0.47  CALCIUM 9.5   Liver Function Tests No results for input(s): AST, ALT, ALKPHOS, BILITOT, PROT, ALBUMIN in the last 72 hours. No results for input(s): LIPASE, AMYLASE in the last 72 hours. Cardiac Enzymes No results for input(s): CKTOTAL, CKMB, CKMBINDEX, TROPONINI in the last 72 hours. BNP Invalid input(s): POCBNP D-Dimer No results for input(s): DDIMER in the last 72 hours. Hemoglobin A1C No results for input(s): HGBA1C in the last 72 hours. Fasting Lipid Panel No results for input(s): CHOL, HDL, LDLCALC, TRIG, CHOLHDL, LDLDIRECT in the last 72 hours. Thyroid Function Tests No results for input(s): TSH, T4TOTAL, T3FREE, THYROIDAB in the last 72 hours.  Invalid input(s): FREET3  Telemetry    NSR with PVCs- Personally Reviewed  ECG    NSR HR 71 - Personally Reviewed  Radiology    No results found.  Cardiac Studies   07/03/16 Procedures  PFO Closure  Conclusion  Successful transcatheter PFO closure using a 25 mm Amplatzer PFO occluder  Indications  PFO (patent foramen ovale) [Q21.1 (ICD-10-CM)]    Patient Profile      CHARMION HEHR is a 45 y.o. female with a history of cryptogenic CVA and PFO who presented to [facility] on 07/03/16 for planned PFO closure.   Assessment & Plan   Cryptogenic CVA and PFO: s/p successful transcatheter PFO closure using a 25 mm Amplatzer PFO occluder. 2D ECHO pending today. If stable, likely discharge home. Groin site stable.   Signed, Angelena Form, PA-C  07/04/2016, 6:55 AM   The patient was seen, examined and discussed with Nell Range, PA-C and I agree with the above.   A very pleasant 45 year old female  with a history of cryptogenic CVA, post successful PFO closure yesterday, TTE shows normally placed Amplatzer device, no flow with color Doppler, but no bubble study performed. We will obtained additional images and plan for a discharge later. We will continue ASA/Plavix for 6 months.   Ena Dawley, MD 07/04/2016

## 2016-07-04 NOTE — Discharge Instructions (Signed)

## 2016-07-05 DIAGNOSIS — I639 Cerebral infarction, unspecified: Secondary | ICD-10-CM

## 2016-07-05 MED FILL — Lidocaine HCl Local Preservative Free (PF) Inj 1%: INTRAMUSCULAR | Qty: 30 | Status: AC

## 2016-07-12 ENCOUNTER — Telehealth: Payer: Self-pay | Admitting: Family Medicine

## 2016-07-12 NOTE — Telephone Encounter (Signed)
I did this the other day can we track it down. I just want to make sure it gets to where it should

## 2016-07-12 NOTE — Telephone Encounter (Signed)
Patient called to follow up on FMLA paperwork. States she needs it before the 21st.

## 2016-07-15 MED FILL — RABEPRAZOLE SOD DR 20 MG TA: 20 | 90 days supply | Qty: 90 | Fill #1

## 2016-07-15 MED FILL — raNITIdine HCL 300 MG TABS: 300 | 30 days supply | Qty: 30 | Fill #1

## 2016-07-15 NOTE — Telephone Encounter (Signed)
Received completed [employer] FMLA form from Dr. Charlett Blake.  All forms faxed to [employer] at [phone]).  Confirmation received.  Pt aware that form is completed and faxed.  Asked the pt if she would like the original copy.  Pt stated yes.  Informed the pt that I will leave the original copy of front for her to pick up.  Pt agreed.  Copy made and sent for scanning.//AB/CMA

## 2016-07-17 DIAGNOSIS — H524 Presbyopia: Secondary | ICD-10-CM | POA: Diagnosis not present

## 2016-07-31 MED FILL — RESTASIS 0.05% EYE EMULSION: 0.05 | 90 days supply | Qty: 180 | Fill #0

## 2016-08-02 ENCOUNTER — Telehealth: Payer: Self-pay | Admitting: Cardiovascular Disease

## 2016-08-02 ENCOUNTER — Encounter: Payer: Self-pay | Admitting: Cardiovascular Disease

## 2016-08-02 ENCOUNTER — Ambulatory Visit (INDEPENDENT_AMBULATORY_CARE_PROVIDER_SITE_OTHER): Payer: 59 | Admitting: Cardiovascular Disease

## 2016-08-02 ENCOUNTER — Ambulatory Visit (HOSPITAL_COMMUNITY): Payer: 59 | Attending: Cardiovascular Disease

## 2016-08-02 ENCOUNTER — Other Ambulatory Visit: Payer: Self-pay

## 2016-08-02 VITALS — BP 120/70 | HR 70 | Ht 63.5 in | Wt 129.4 lb

## 2016-08-02 DIAGNOSIS — Q211 Atrial septal defect: Secondary | ICD-10-CM | POA: Diagnosis not present

## 2016-08-02 DIAGNOSIS — Q2112 Patent foramen ovale: Secondary | ICD-10-CM

## 2016-08-02 MED ORDER — AMOXICILLIN 500 MG PO TABS: ORAL_TABLET | ORAL | 1 refills | Status: DC

## 2016-08-02 MED FILL — AMOXICILLIN 500 MG CAPSULE: 500 | 2 days supply | Qty: 8 | Fill #0

## 2016-08-02 NOTE — Progress Notes (Signed)
Cardiology Office Note Date:  08/02/2016   ID:  Melinda Kirk, DOB October 29, 1970, MRN [id number]  PCP:  [name], MD  Cardiologist:  [name], MD    Chief Complaint  Patient presents with  . PFO    s/p closure     History of Present Illness: Melinda Kirk is a 46 y.o. female who presents for Follow-up after undergoing transcatheter PFO closure 07/03/2016. The patient has a history of cryptogenic stroke in 2015. After review of risks, indications, and alternatives to transcatheter PFO closure, the patient elected to proceed on 07/03/2016. She was treated with a 25 mm Amplatzer PFO occluder without complication.  The patient returns today for follow-up evaluation. She has been doing fine. However, she does admit to some mild chest discomfort that she feels all the time since the device was placed. She denies shortness of breath, heart palpitations, orthopnea, PND, lightheadedness, or syncope. Her chest discomfort is unrelated to physical activity. She hasn't been too active and has not yet returned to work. She has done a little bit of traveling.  Past Medical History:  Diagnosis Date  . Anemia 07/08/2015  . Breast calcification, left 07/29/04  . Cryptogenic stroke ([place]) 2015   a. s/p PFO closure on 07/03/16  . GERD (gastroesophageal reflux disease)   . Menorrhagia 03/07/2013  . PFO (patent foramen ovale)    a. s/p PFO closure 07/03/16  . Thyroid nodule    s/p FNA- 4/09- benign  . Unspecified hypothyroidism 01/15/2011  . Vitamin D deficiency 03/08/2011    Past Surgical History:  Procedure Laterality Date  . BIOPSY THYROID  10/2007   benign biopsy of thyroid nodules.  [name] PATENT FORAMEN OVALE CLOSURE  07/03/2016  . PATENT FORAMEN OVALE CLOSURE N/A 07/03/2016   Procedure: PFO  Closure;  Surgeon: [name], MD;  Location: [place] [facility];  Service: Cardiovascular;  Laterality: N/A;  . TEE WITHOUT CARDIOVERSION N/A 02/10/2014   Procedure: TRANSESOPHAGEAL ECHOCARDIOGRAM  (TEE);  Surgeon: [name], MD;  Location: [facility];  Service: Cardiovascular;  Laterality: N/A;    Current Outpatient Prescriptions  Medication Sig Dispense Refill  . ALPRAZolam (XANAX) 0.25 MG tablet Take 1 tablet (0.25 mg total) by mouth 3 (three) times daily as needed for anxiety or sleep. 30 tablet 1  . aspirin EC 81 MG tablet Take 81 mg by mouth daily.    . B Complex-Biotin-FA (VITAMIN B50 COMPLEX PO) Take 1 tablet by mouth daily.    . Calcium Carbonate-Vitamin D (CALTRATE 600+D PO) Take 1 tablet by mouth daily.    . Cholecalciferol 2000 UNITS CAPS Take 2,000 Units by mouth daily.     . clopidogrel (PLAVIX) 75 MG tablet Take 1 tablet (75 mg total) by mouth daily. 90 tablet 3  . ferrous sulfate 325 (65 FE) MG tablet Take 325 mg by mouth daily with breakfast.    . hyoscyamine (LEVSIN SL) 0.125 MG SL tablet Place 0.125 mg under the tongue every 4 (four) hours as needed (acid reflux).    . meclizine (ANTIVERT) 25 MG tablet Take 1 tablet (25 mg total) by mouth 3 (three) times daily as needed for dizziness. 30 tablet 0  . Multiple Vitamins-Minerals (MULTIVITAMIN WITH MINERALS) tablet Take 1 tablet by mouth daily.    . naproxen sodium (ALEVE) 220 MG tablet Take 440 mg by mouth daily as needed (headache).    . Omega-3 Fatty Acids (EQL OMEGA 3 FISH OIL) 1400 MG CAPS Take 1,400 mg by mouth daily.     [name]  Probiotic Product (PROBIOTIC ADVANCED) CAPS Take 1 capsule by mouth daily.    . RABEprazole (ACIPHEX) 20 MG tablet Take 1 tablet (20 mg total) by mouth daily. 30 tablet 3  . ranitidine (ZANTAC) 300 MG tablet TAKE 1 TABLET (300 MG TOTAL) BY MOUTH AT BEDTIME. 30 tablet 1  . RESTASIS 0.05 % ophthalmic emulsion Place 1 drop into both eyes 2 (two) times daily.  4  . sucralfate (CARAFATE) 1 GM/10ML suspension Take 10 mLs (1 g total) by mouth 4 (four) times daily -  with meals and at bedtime. 420 mL 0  . Turmeric 450 MG CAPS Take 450 mg by mouth daily.     . vitamin C (ASCORBIC ACID) 500 MG  tablet Take 500 mg by mouth daily.     No current facility-administered medications for this visit.     Allergies:   Patient has no known allergies.   Social History:  The patient  reports that she quit smoking about 20 years ago. Her smoking use included Cigarettes. She has a 1.25 pack-year smoking history. She has never used smokeless tobacco. She reports that she drinks alcohol. She reports that she does not use drugs.   Family History:  The patient's  family history includes Arthritis in her mother; Heart disease in her father; Stroke in her mother.   ROS:  Please see the history of present illness.  All other systems are reviewed and negative.   PHYSICAL EXAM: VS:  BP 120/70   Pulse 70   Ht 5' 3.5" (1.613 m)   Wt 129 lb 6.4 oz (58.7 kg)   BMI 22.56 kg/m  , BMI Body mass index is 22.56 kg/m. GEN: Well nourished, well developed, in no acute distress  HEENT: normal  Neck: no JVD, no masses. No carotid bruits Cardiac: RRR without murmur or gallop                Respiratory:  clear to auscultation bilaterally, normal work of breathing GI: soft, nontender, nondistended, + BS MS: no deformity or atrophy  Ext: no pretibial edema, pedal pulses 2+= bilaterally Skin: warm and dry, no rash Neuro:  Strength and sensation are intact Psych: euthymic mood, full affect  EKG:  EKG is ordered today. The ekg ordered today shows normal sinus rhythm 70 bpm, within normal limits.  Recent Labs: 03/13/2016: TSH 0.89 04/02/2016: ALT 14 07/03/2016: BUN 9; Creatinine, Ser 0.47; Hemoglobin 13.0; Platelets 188; Potassium 4.0; Sodium 139   Lipid Panel     Component Value Date/Time   CHOL 186 01/26/2015 1356   TRIG 77.0 01/26/2015 1356   HDL 60.00 01/26/2015 1356   CHOLHDL 3 01/26/2015 1356   VLDL 15.4 01/26/2015 1356   LDLCALC 111 (H) 01/26/2015 1356      Wt Readings from Last 3 Encounters:  08/02/16 129 lb 6.4 oz (58.7 kg)  07/04/16 130 lb 1.1 oz (59 kg)  06/27/16 131 lb (59.4 kg)      Cardiac Studies Reviewed: Echo 08/02/2016: Left ventricle:  The cavity size was normal. Wall thickness was normal. Systolic function was normal. The estimated ejection fraction was in the range of 60% to 65%. Wall motion was normal; there were no regional wall motion abnormalities.  ------------------------------------------------------------------- Aortic valve:   Structurally normal valve.   Cusp separation was normal.  Doppler:  Transvalvular velocity was within the normal range. There was no stenosis. There was no regurgitation.  ------------------------------------------------------------------- Aorta:  Aortic root: The aortic root was normal in size. Ascending aorta: The  ascending aorta was normal in size.  ------------------------------------------------------------------- Mitral valve:   Structurally normal valve.   Leaflet separation was normal.  Doppler:  Transvalvular velocity was within the normal range. There was no evidence for stenosis. There was trivial regurgitation.  ------------------------------------------------------------------- Atrial septum:  PFO closure device in place No bubble study was done. No shunting by color doppler flow .  ------------------------------------------------------------------- Right ventricle:  The cavity size was normal. Systolic function was normal.  ------------------------------------------------------------------- Pericardium:  There was no pericardial effusion.  ASSESSMENT AND PLAN: PFO with cryptogenic stroke: The patient is doing well now one month after transcatheter PFO closure. I personally reviewed her echo images which shows appropriate device position and no evidence of pericardial effusion. She will return for a six-month visit with a limited echo and bubble study. She understands the need for SBE prophylaxis. She has upcoming dental work and will be prescribed amoxicillin 2 g one hour prior to the procedure. She  should continue on dual antiplatelet therapy with aspirin and Plavix for a total of 6 months, then lifelong low-dose aspirin.  Current medicines are reviewed with the patient today.  The patient does not have concerns regarding medicines.  Labs/ tests ordered today include:  No orders of the defined types were placed in this encounter.   Disposition:   FU 6 months with echo/bubble study prior to visit  Signed, [name], MD  08/02/2016 10:52 AM    [facility] [address], [place], [place]  [address] Phone: [phone]; Fax: [phone]

## 2016-08-02 NOTE — Patient Instructions (Signed)
Medication Instructions:  Your physician recommends that you continue on your current medications as directed. Please refer to the Current Medication list given to you today.  Labwork: No new orders.   Testing/Procedures: Your physician has requested that you have a LIMITED echocardiogram with BUBBLE STUDY in 6 MONTHS. Echocardiography is a painless test that uses sound waves to create images of your heart. It provides your doctor with information about the size and shape of your heart and how well your heart's chambers and valves are working. This procedure takes approximately one hour. There are no restrictions for this procedure.  Follow-Up: Your physician wants you to follow-up in: 6 MONTHS with Dr Burt Knack. You will receive a reminder letter in the mail two months in advance. If you don't receive a letter, please call our office to schedule the follow-up appointment.   Any Other Special Instructions Will Be Listed Below (If Applicable).  Your physician discussed the importance of taking an antibiotic prior to any dental, gastrointestinal, genitourinary procedures to prevent damage to the heart valves from infection for the first 6 MONTHS after PFO closure.    If you need a refill on your cardiac medications before your next appointment, please call your pharmacy.

## 2016-08-02 NOTE — Telephone Encounter (Signed)
Clearance Letter Faxed.

## 2016-08-02 NOTE — Telephone Encounter (Signed)
New Message   Patient had appt today, and received clearance letter also wants copy of clearance letter faxed to  Absence management Fax: [phone]

## 2016-08-13 ENCOUNTER — Encounter: Payer: Self-pay | Admitting: Family Medicine

## 2016-08-13 ENCOUNTER — Ambulatory Visit (INDEPENDENT_AMBULATORY_CARE_PROVIDER_SITE_OTHER): Payer: 59 | Admitting: Family Medicine

## 2016-08-13 ENCOUNTER — Telehealth: Payer: Self-pay | Admitting: Family Medicine

## 2016-08-13 DIAGNOSIS — M25512 Pain in left shoulder: Secondary | ICD-10-CM | POA: Diagnosis not present

## 2016-08-13 MED ORDER — NITROGLYCERIN 0.2 MG/HR TD PT24: MEDICATED_PATCH | TRANSDERMAL | 1 refills | Status: DC

## 2016-08-13 MED FILL — NITROGLYCERIN 0.2 MG/HR PTC: 0.2 | 90 days supply | Qty: 30 | Fill #0

## 2016-08-13 NOTE — Telephone Encounter (Signed)
She wanted to let you know that she does want to do PT.  Also, she has questions about her restrictions and if she needs a letter for work. thanks

## 2016-08-13 NOTE — Patient Instructions (Signed)
You have rotator cuff impingement Try to avoid painful activities (overhead activities, lifting with extended arm) as much as possible. Try the nitro patches - 1/4th patch over affected shoulder, change daily. Continue the topical medicine you were using before. Can take tylenol in addition to this. Consider injection or physical therapy if not improving. Do home exercise program with theraband and scapular stabilization exercises daily - these are very important for long term relief even if an injection was given.  3 sets of 10 once a day Follow up with me in 6 weeks. I don't think an MRI is necessary at this point - there's no evidence of a rotator cuff tear based on your exam.

## 2016-08-16 ENCOUNTER — Encounter: Payer: Self-pay | Admitting: *Deleted

## 2016-08-16 DIAGNOSIS — M25512 Pain in left shoulder: Secondary | ICD-10-CM

## 2016-08-16 NOTE — Telephone Encounter (Signed)
[place] can you give her a call - ok to refer her for PT wherever she would like (including downstairs).  She would benefit from two restrictions if she wants a letter stating so:  No overhead lifting, no lifting more than 15 pounds.  If she does, let me know and I'll print it out.

## 2016-08-19 NOTE — Assessment & Plan Note (Signed)
2/2 rotator cuff impingement.  Doing home exercises without much benefit.  On plavix, aspirin s/p PFO closure.  She would like to continue HEP and try nitro patches.  Will consider physical therapy, injection if not improving.  F/u in 6 weeks.

## 2016-08-19 NOTE — Progress Notes (Signed)
PCP and consultation requested by: [name], MD  Subjective:   HPI: Patient is a 46 y.o. female here for left shoulder, low back pain.  06/27/16: Patient reports she's had 2-3 months of pain in left upper arm pain. Worse with reaching, overhead motions. + night pain. Tried massage, heat, topical medication. No radiation. No numbness or tingling. No neck pain. Also has several year history of low back pain.  Did physical therapy, anti-inflammatories. Had MRI 2 years ago showing mild spondylosis but no evidence stenosis, significant discogenic issues. She uses a back brace which helps some. No bowel/bladder dysfunction. No radiation into legs. No numbness or tingling.  08/13/16: Patient reports she feels some worse since last visit. Pain level 7/10, sharp. Motion limited trying to reach behind back. Hard to pull, lift things. Cannot lie on left side either. Some localized swelling upper arm. Gets occasional tingling and numbness left hand. No skin changes, other complaints. No neck pain.  Past Medical History:  Diagnosis Date  . Anemia 07/08/2015  . Breast calcification, left 07/29/04  . Cryptogenic stroke ([place]) 2015   a. s/p PFO closure on 07/03/16  . GERD (gastroesophageal reflux disease)   . Menorrhagia 03/07/2013  . PFO (patent foramen ovale)    a. s/p PFO closure 07/03/16  . Thyroid nodule    s/p FNA- 4/09- benign  . Unspecified hypothyroidism 01/15/2011  . Vitamin D deficiency 03/08/2011    Current Outpatient Prescriptions on File Prior to Visit  Medication Sig Dispense Refill  . ALPRAZolam (XANAX) 0.25 MG tablet Take 1 tablet (0.25 mg total) by mouth 3 (three) times daily as needed for anxiety or sleep. 30 tablet 1  . amoxicillin (AMOXIL) 500 MG tablet Take 4 tablets by mouth one hour prior to dental appointment 8 tablet 1  . aspirin EC 81 MG tablet Take 81 mg by mouth daily.    . B Complex-Biotin-FA (VITAMIN B50 COMPLEX PO) Take 1 tablet by mouth daily.    .  Calcium Carbonate-Vitamin D (CALTRATE 600+D PO) Take 1 tablet by mouth daily.    . Cholecalciferol 2000 UNITS CAPS Take 2,000 Units by mouth daily.     . clopidogrel (PLAVIX) 75 MG tablet Take 1 tablet (75 mg total) by mouth daily. 90 tablet 3  . ferrous sulfate 325 (65 FE) MG tablet Take 325 mg by mouth daily with breakfast.    . hyoscyamine (LEVSIN SL) 0.125 MG SL tablet Place 0.125 mg under the tongue every 4 (four) hours as needed (acid reflux).    . meclizine (ANTIVERT) 25 MG tablet Take 1 tablet (25 mg total) by mouth 3 (three) times daily as needed for dizziness. 30 tablet 0  . Multiple Vitamins-Minerals (MULTIVITAMIN WITH MINERALS) tablet Take 1 tablet by mouth daily.    . naproxen sodium (ALEVE) 220 MG tablet Take 440 mg by mouth daily as needed (headache).    . Omega-3 Fatty Acids (EQL OMEGA 3 FISH OIL) 1400 MG CAPS Take 1,400 mg by mouth daily.     . Probiotic Product (PROBIOTIC ADVANCED) CAPS Take 1 capsule by mouth daily.    . RABEprazole (ACIPHEX) 20 MG tablet Take 1 tablet (20 mg total) by mouth daily. 30 tablet 3  . ranitidine (ZANTAC) 300 MG tablet TAKE 1 TABLET (300 MG TOTAL) BY MOUTH AT BEDTIME. 30 tablet 1  . RESTASIS 0.05 % ophthalmic emulsion Place 1 drop into both eyes 2 (two) times daily.  4  . sucralfate (CARAFATE) 1 GM/10ML suspension Take 10 mLs (1  g total) by mouth 4 (four) times daily -  with meals and at bedtime. 420 mL 0  . Turmeric 450 MG CAPS Take 450 mg by mouth daily.     . vitamin C (ASCORBIC ACID) 500 MG tablet Take 500 mg by mouth daily.     No current facility-administered medications on file prior to visit.     Past Surgical History:  Procedure Laterality Date  . BIOPSY THYROID  10/2007   benign biopsy of thyroid nodules.  [name] PATENT FORAMEN OVALE CLOSURE  07/03/2016  . PATENT FORAMEN OVALE CLOSURE N/A 07/03/2016   Procedure: PFO  Closure;  Surgeon: [name], MD;  Location: [facility];  Service: Cardiovascular;  Laterality: N/A;  . TEE  WITHOUT CARDIOVERSION N/A 02/10/2014   Procedure: TRANSESOPHAGEAL ECHOCARDIOGRAM (TEE);  Surgeon: [name], MD;  Location: [facility];  Service: Cardiovascular;  Laterality: N/A;    No Known Allergies  Social History   Social History  . Marital status: Married    Spouse name: N/A  . Number of children: 3  . Years of education: N/A   Occupational History  . Nurse    Social History Main Topics  . Smoking status: Former Smoker    Packs/day: 0.25    Years: 5.00    Types: Cigarettes    Quit date: 1998  . Smokeless tobacco: Never Used  . Alcohol use Yes     Comment: 07/03/2016 "might have a few drinks/year"  . Drug use: No  . Sexual activity: Yes   Other Topics Concern  . Not on file   Social History Narrative   Regular exercise:  No   Caffeine: 1-2 cups coffee / tea daily.   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    04/05/2015 last update       Family History  Problem Relation Age of Onset  . Stroke Mother   . Arthritis Mother   . Heart disease Father     CHF?    BP 111/73   Pulse 78   Ht 5\' 3"  (1.6 m)   Wt 128 lb (58.1 kg)   BMI 22.67 kg/m   Review of Systems: See HPI above.     Objective:  Physical Exam:  Gen: NAD, comfortable in exam room  Left shoulder: No swelling, ecchymoses.  No gross deformity. No TTP. FROM with painful arc. Positive Hawkins, Neers. Negative Yergasons. Strength 5/5 with empty can and resisted internal/external rotation.  Pain empty can. Negative apprehension. NV intact distally.  Right shoulder: FROM without pain.   Assessment & Plan:  1. Left shoulder pain - 2/2 rotator cuff impingement.  Doing home exercises without much benefit.  On plavix, aspirin s/p PFO closure.  She would like to continue HEP and try nitro patches.  Will consider physical therapy, injection if not improving.  F/u in 6 weeks.

## 2016-08-20 ENCOUNTER — Ambulatory Visit: Payer: 59 | Attending: Family Medicine | Admitting: Physical Therapy

## 2016-08-20 DIAGNOSIS — M25511 Pain in right shoulder: Secondary | ICD-10-CM | POA: Diagnosis not present

## 2016-08-20 DIAGNOSIS — R293 Abnormal posture: Secondary | ICD-10-CM | POA: Insufficient documentation

## 2016-08-20 DIAGNOSIS — M25611 Stiffness of right shoulder, not elsewhere classified: Secondary | ICD-10-CM | POA: Diagnosis not present

## 2016-08-20 DIAGNOSIS — M6281 Muscle weakness (generalized): Secondary | ICD-10-CM | POA: Diagnosis not present

## 2016-08-20 NOTE — Patient Instructions (Signed)
Cane Exercise: Flexion   Lie on back, holding cane above chest. Keeping arms as straight as possible, lower cane toward floor beyond head. Hold _5-10__ seconds. Repeat __15__ times. Do _2___ sessions per day.   Cane Exercise: Abduction   Hold cane with right hand over end, palm-up, with other hand palm-down. Move arm out from side and up by pushing with other arm. Hold __5-10__ seconds. Repeat __15__ times. Do _2___ sessions per day.   External Rotation  Active-Assist - Supine (Cane)   Lie on back, affected arm out from side, elbow at 90, forearm forward. Use cane to assist in lifting forearm of affected arm to neutral.  _15__ reps per set, _2__ sets per day.    Scapular Retraction (Standing)    With arms at sides, pinch shoulder blades together. Repeat __15__ times per set. Do _2___ sets per session.

## 2016-08-20 NOTE — Therapy (Signed)
[facility] [address]  [place], [place], [address] Phone: [phone]   Fax:  [phone]  Physical Therapy Evaluation  Patient Details  Name: Melinda Kirk MRN: [id number] Date of Birth: Oct 14, 1970 Referring Provider: Dr. Karlton Lemon  Encounter Date: 08/20/2016      PT End of Session - 08/20/16 1643    Visit Number 1   Number of Visits 16   Date for PT Re-Evaluation 10/15/16   PT Start Time 1017   PT Stop Time 1100   PT Time Calculation (min) 43 min   Activity Tolerance Patient tolerated treatment well;Patient limited by pain   Behavior During Therapy [facility] for tasks assessed/performed      Past Medical History:  Diagnosis Date  . Anemia 07/08/2015  . Breast calcification, left 07/29/04  . Cryptogenic stroke ([place]) 2015   a. s/p PFO closure on 07/03/16  . GERD (gastroesophageal reflux disease)   . Menorrhagia 03/07/2013  . PFO (patent foramen ovale)    a. s/p PFO closure 07/03/16  . Thyroid nodule    s/p FNA- 4/09- benign  . Unspecified hypothyroidism 01/15/2011  . Vitamin D deficiency 03/08/2011    Past Surgical History:  Procedure Laterality Date  . BIOPSY THYROID  10/2007   benign biopsy of thyroid nodules.  [name] PATENT FORAMEN OVALE CLOSURE  07/03/2016  . PATENT FORAMEN OVALE CLOSURE N/A 07/03/2016   Procedure: PFO  Closure;  Surgeon: [name], MD;  Location: [facility];  Service: Cardiovascular;  Laterality: N/A;  . TEE WITHOUT CARDIOVERSION N/A 02/10/2014   Procedure: TRANSESOPHAGEAL ECHOCARDIOGRAM (TEE);  Surgeon: [name], MD;  Location: [facility] ENDOSCOPY;  Service: Cardiovascular;  Laterality: N/A;    There were no vitals filed for this visit.       Subjective Assessment - 08/20/16 1019    Subjective Patient reporting L shoulder pain. Saw Dr. Barbaraann Barthel in November - had PFO closure December. Was out of work for approx 1 month - when she went back to work had an increase in pain.  Patient reporting possible RC impingement dx from MD. Now having some N/T and swelling in L UE. Has began using Nitro patches for pain. 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 Starting light duty this week.    Patient is accompained by: Family member   Limitations Lifting   Diagnostic tests none   Patient Stated Goals reduce pain, improve function   Currently in Pain? Yes   Pain Score 10-Worst pain ever  with activie movements   Pain Location Shoulder   Pain Orientation Left   Pain Descriptors / Indicators Stabbing;Sharp   Pain Type Chronic pain   Pain Onset More than a month ago   Pain Frequency Intermittent   Aggravating Factors  lifting, twisting, movement   Pain Relieving Factors heat, Aleve            [facility] PT Assessment - 08/20/16 1026      Assessment   Medical Diagnosis L shoulder pain   Referring Provider Dr. Karlton Lemon   Onset Date/Surgical Date --  3 months ago   Next MD Visit prn   Prior Therapy yes     Precautions   Precautions None     Restrictions   Weight Bearing Restrictions No     Balance Screen   Has the patient fallen in the past 6 months No   Has the patient had a decrease in activity level  because of a fear of falling?  No   Is the patient reluctant to leave their home because of a fear of falling?  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   Leisure sing, dance     Cognition   Overall Cognitive Status Within Functional Limits for tasks assessed     Observation/Other Assessments   Focus on Therapeutic Outcomes (FOTO)  Shoulder: 47(53% limited, predicted 38% limited)     Coordination   Gross Motor Movements are Fluid and Coordinated Yes     Posture/Postural Control   Posture/Postural Control Postural limitations   Postural  Limitations Rounded Shoulders;Forward head     ROM / Strength   AROM / PROM / Strength AROM;PROM;Strength     AROM   AROM Assessment Site Shoulder   Right/Left Shoulder Right;Left   Right Shoulder Flexion 170 Degrees   Right Shoulder ABduction 170 Degrees   Right Shoulder Internal Rotation --  FIR to approx mid back   Right Shoulder External Rotation --  FER to approx T1   Left Shoulder Flexion 98 Degrees   Left Shoulder ABduction 85 Degrees   Left Shoulder Internal Rotation --  FIR to L iliac crest   Left Shoulder External Rotation --  FER to approx lateral ear     PROM   Overall PROM Comments pain and heavy gurading will all PROM   PROM Assessment Site Shoulder   Right/Left Shoulder Left   Left Shoulder Flexion 125 Degrees   Left Shoulder ABduction 85 Degrees   Left Shoulder Internal Rotation 56 Degrees   Left Shoulder External Rotation 68 Degrees     Strength   Strength Assessment Site Shoulder   Right/Left Shoulder Right;Left   Right Shoulder Flexion 4/5   Right Shoulder ABduction 4/5   Right Shoulder Internal Rotation 4/5   Right Shoulder External Rotation 4/5   Left Shoulder Flexion 3+/5   Left Shoulder ABduction 3+/5   Left Shoulder Internal Rotation 4-/5   Left Shoulder External Rotation 3/5     Palpation   Palpation comment tenderness to deep palpation along AC joint line as well as into deltoid and periscapular musculature     Special Tests    Special Tests Rotator Cuff Impingement   Rotator Cuff Impingment tests Michel Bickers test     Hawkins-Kennedy test   Findings Positive   Side Left                           PT Education - 08/20/16 1643    Education provided Yes   Education Details exam findings, POC, HEP   Person(s) Educated Patient   Methods Explanation;Demonstration;Handout   Comprehension Verbalized understanding;Returned demonstration;Need further instruction          PT Short Term Goals - 08/20/16 1651       PT SHORT TERM GOAL #1   Title patient to improve L shoulder PROM equal to that of R shoulder with no increase in pain (09/17/16)           PT Long Term Goals - 08/20/16 1650      PT LONG TERM GOAL #1   Title patient to be independent with advanced HEP (10/15/16)   Status New  PT LONG TERM GOAL #2   Title patient to improve L shoulder AROM equal to that of R shouler with no increase in pain (10/15/16)   Status New     PT LONG TERM GOAL #3   Title Patient to improve L shoulder strength to >/= 4+5 with no increase in pain (10/15/16)   Status New     PT LONG TERM GOAL #4   Title Patient to report return to normal ADLs and work related task with pain no greater than 1/10 for greater than 2 weeks demonstrating improved functional use of L shoulder (10/15/16)   Status New               Plan - 08/20/16 1644    Clinical Impression Statement Patient is a 46 y/o female presenting to [facility] today for low complexity evaluation regarding L shoulder pain that limits functional use of upper extremity. Patient with a history significant for recent PFO closure. Patient today with significant reduction in AROM and PROM with pain limiting all movements. Patient heavily guraded during all passive movements with patient stating fear of pain limiting motion. Patient also with reduced strength with pain limiting, forward shoulders, as well as reduced functional use of UE needed for ADLs and work related tasks. Patient given initial HEP of AAROM tasks to begin incorporating more motion of L shoulder with patient continuing to be heavily guarded. Patient to benefit from PT to address the above listed deficits to improve functional use of L UE.    Rehab Potential Good   PT Frequency 2x / week   PT Duration 8 weeks   PT Treatment/Interventions ADLs/Self Care Home Management;Cryotherapy;Electrical Stimulation;Iontophoresis 4mg /ml Dexamethasone;Moist Heat;Ultrasound;Therapeutic exercise;Therapeutic  activities;Patient/family education;Manual techniques;Passive range of motion;Vasopneumatic Device;Taping;Dry needling   PT Next Visit Plan gentle ROM and strengthening to tolerance   Consulted and Agree with Plan of Care Patient      Patient will benefit from skilled therapeutic intervention in order to improve the following deficits and impairments:  Decreased activity tolerance, Decreased range of motion, Decreased strength, Impaired UE functional use, Pain  Visit Diagnosis: Acute pain of right shoulder - Plan: PT plan of care cert/re-cert  Stiffness of right shoulder, not elsewhere classified - Plan: PT plan of care cert/re-cert  Abnormal posture - Plan: PT plan of care cert/re-cert  Muscle weakness (generalized) - Plan: PT plan of care cert/re-cert     Problem List Patient Active Problem List   Diagnosis Date Noted  . Pain in joint of left shoulder 06/30/2016  . Palpitations 04/02/2016  . Anemia 07/08/2015  . Cervical cancer screening 07/04/2015  . Dysphagia 04/05/2015  . Low back pain 11/21/2014  . Muscle spasm of back 04/11/2014  . Cryptogenic stroke ([place]) 01/27/2014  . PFO (patent foramen ovale) 01/23/2014  . Menorrhagia 03/07/2013  . Hyperlipidemia, mixed 02/08/2013  . GERD (gastroesophageal reflux disease) 02/08/2013  . Migraine 01/03/2013  . Conjunctival hemorrhage of left eye 09/23/2011  . Vitamin D deficiency 03/08/2011  . 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 Thyroid nodule 01/15/2011  . Nevus 01/15/2011  . Breast calcification, left 01/15/2011    Lanney Gins, PT, DPT 08/20/16 4:55 PM   [facility] [address]  [place], [place], [address] Phone: [phone]   Fax:  [phone]  Name: Melinda Kirk MRN: [id number] Date of Birth: [birth year]

## 2016-08-22 ENCOUNTER — Ambulatory Visit: Payer: 59

## 2016-08-22 DIAGNOSIS — M6281 Muscle weakness (generalized): Secondary | ICD-10-CM

## 2016-08-22 DIAGNOSIS — R293 Abnormal posture: Secondary | ICD-10-CM | POA: Diagnosis not present

## 2016-08-22 DIAGNOSIS — M25511 Pain in right shoulder: Secondary | ICD-10-CM

## 2016-08-22 DIAGNOSIS — M25611 Stiffness of right shoulder, not elsewhere classified: Secondary | ICD-10-CM | POA: Diagnosis not present

## 2016-08-22 NOTE — Therapy (Signed)
[facility] [address]  [place], [place], [address] Phone: [phone]   Fax:  [phone]  Physical Therapy Treatment  Patient Details  Name: Melinda Kirk MRN: [id number] Date of Birth: Dec 03, 1970 Referring Provider: Dr. Karlton Lemon  Encounter Date: 08/22/2016      PT End of Session - 08/22/16 1104    Visit Number 2   Number of Visits 16   Date for PT Re-Evaluation 10/15/16   PT Start Time 1058   PT Stop Time 1155   PT Time Calculation (min) 57 min   Activity Tolerance Patient tolerated treatment well;Patient limited by pain   Behavior During Therapy [facility] for tasks assessed/performed      Past Medical History:  Diagnosis Date  . Anemia 07/08/2015  . Breast calcification, left 07/29/04  . Cryptogenic stroke ([place]) 2015   a. s/p PFO closure on 07/03/16  . GERD (gastroesophageal reflux disease)   . Menorrhagia 03/07/2013  . PFO (patent foramen ovale)    a. s/p PFO closure 07/03/16  . Thyroid nodule    s/p FNA- 4/09- benign  . Unspecified hypothyroidism 01/15/2011  . Vitamin D deficiency 03/08/2011    Past Surgical History:  Procedure Laterality Date  . BIOPSY THYROID  10/2007   benign biopsy of thyroid nodules.  [name] PATENT FORAMEN OVALE CLOSURE  07/03/2016  . PATENT FORAMEN OVALE CLOSURE N/A 07/03/2016   Procedure: PFO  Closure;  Surgeon: [name], MD;  Location: [facility];  Service: Cardiovascular;  Laterality: N/A;  . TEE WITHOUT CARDIOVERSION N/A 02/10/2014   Procedure: TRANSESOPHAGEAL ECHOCARDIOGRAM (TEE);  Surgeon: [name], MD;  Location: [facility] ENDOSCOPY;  Service: Cardiovascular;  Laterality: N/A;    There were no vitals filed for this visit.      Subjective Assessment - 08/22/16 1057    Subjective Pt. reporting she has the most pain with reaching at this point.     Patient Stated Goals reduce pain, improve function   Currently in Pain? No/denies   Pain Score 3    Pain  Location Shoulder   Pain Orientation Left   Pain Descriptors / Indicators Sharp;Stabbing   Pain Type Chronic pain   Pain Onset More than a month ago   Pain Frequency Intermittent   Aggravating Factors  lifting, movement    Pain Relieving Factors Aleve    Multiple Pain Sites No            [facility] Adult PT Treatment/Exercise - 08/22/16 1131      Exercises   Exercises Shoulder     Shoulder Exercises: Supine   External Rotation AAROM;Left;15 reps  5" hold   External Rotation Limitations with pillow under elbow; heavy cues required    Flexion AAROM;Left;15 reps   Flexion Limitations 5" hold; heavy cues required    ABduction AAROM;Left;15 reps   ABduction Limitations 5" hold; heavy cues required     Shoulder Exercises: Seated   Retraction AROM;10 reps;Strengthening   Retraction Limitations 5" hold; 2 sets with 2nd set leaning on doorseal      Shoulder Exercises: Standing   External Rotation AROM;15 reps;Theraband;Strengthening   Theraband Level (Shoulder External Rotation) Level 1 (Yellow)   External Rotation Limitations Towel under elbow; heavy cues required for good technique; yellow TB due to pt. request   Internal Rotation AROM;15 reps;Theraband;Strengthening   Internal Rotation Limitations Towel under elbow; Heavy cues required for good technique; yellow TB used due to pt. request  Shoulder Exercises: Pulleys   Flexion 3 minutes   Flexion Limitations heavy cues required to relax L shoulder    ABduction 3 minutes   ABduction Limitations Heavy cues required to relax L shoulder      Shoulder Exercises: ROM/Strengthening   UBE (Upper Arm Bike) UBE: lvl 1.0, 3 min each way      Modalities   Modalities Vasopneumatic     Vasopneumatic   Number Minutes Vasopneumatic  10 minutes   Vasopnuematic Location  Shoulder  L shoulder   Vasopneumatic Pressure Low   Vasopneumatic Temperature  coldest temp.           PT Education - 08/22/16 1203    Education provided Yes    Education Details Standing shoulder IR/ER with red TB (pt. already has from MD), biceps curl (pt. instructed to perform from MD office)   Person(s) Educated Patient   Methods Explanation;Demonstration;Verbal cues;Handout;Tactile cues   Comprehension Verbalized understanding;Returned demonstration;Verbal cues required;Tactile cues required;Need further instruction          PT Short Term Goals - 08/20/16 1651      PT SHORT TERM GOAL #1   Title patient to improve L shoulder PROM equal to that of R shoulder with no increase in pain (09/17/16)           PT Long Term Goals - 08/22/16 1111      PT LONG TERM GOAL #1   Title patient to be independent with advanced HEP (10/15/16)   Status On-going     PT LONG TERM GOAL #2   Title patient to improve L shoulder AROM equal to that of R shouler with no increase in pain (10/15/16)   Status On-going     PT LONG TERM GOAL #3   Title Patient to improve L shoulder strength to >/= 4+5 with no increase in pain (10/15/16)   Status On-going     PT LONG TERM GOAL #4   Title Patient to report return to normal ADLs and work related task with pain no greater than 1/10 for greater than 2 weeks demonstrating improved functional use of L shoulder (10/15/16)   Status On-going               Plan - 08/22/16 1113    Clinical Impression Statement Today's treatment focused on HEP review and technique correction with MD and PT HEP activities.  Pt. requiring verbal and tactile correction with all therex today with heavy cueing for wand HEP review.  Pt. reporting high L shoulder pain levels throughout treatment today with muscular guarding throughout wand and pulley AAROM activities.  IR/ER, and biceps curl handout instructions issued to pt. upon request to clarify MD HEP activities.  Pt. will continued to benefit from further skilled therapy to improve scapulohumeral rhythm, overhead strength, and functional capacity.     PT Treatment/Interventions ADLs/Self  Care Home Management;Cryotherapy;Electrical Stimulation;Iontophoresis 4mg /ml Dexamethasone;Moist Heat;Ultrasound;Therapeutic exercise;Therapeutic activities;Patient/family education;Manual techniques;Passive range of motion;Vasopneumatic Device;Taping;Dry needling   PT Next Visit Plan Continued HEP review; gentle ROM and strengthening to tolerance      Patient will benefit from skilled therapeutic intervention in order to improve the following deficits and impairments:  Decreased activity tolerance, Decreased range of motion, Decreased strength, Impaired UE functional use, Pain  Visit Diagnosis: Acute pain of right shoulder  Stiffness of right shoulder, not elsewhere classified  Abnormal posture  Muscle weakness (generalized)     Problem List Patient Active Problem List   Diagnosis Date Noted  . Pain in  joint of left shoulder 06/30/2016  . Palpitations 04/02/2016  . Anemia 07/08/2015  . Cervical cancer screening 07/04/2015  . Dysphagia 04/05/2015  . Low back pain 11/21/2014  . Muscle spasm of back 04/11/2014  . Cryptogenic stroke ([place]) 01/27/2014  . PFO (patent foramen ovale) 01/23/2014  . Menorrhagia 03/07/2013  . Hyperlipidemia, mixed 02/08/2013  . GERD (gastroesophageal reflux disease) 02/08/2013  . Migraine 01/03/2013  . Conjunctival hemorrhage of left eye 09/23/2011  . Vitamin D deficiency 03/08/2011  . 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 Thyroid nodule 01/15/2011  . Nevus 01/15/2011  . Breast calcification, left 01/15/2011    Bess Harvest, PTA 08/22/16 12:11 PM  [facility] [address]  [place], [place], [address] Phone: [phone]   Fax:  [phone]  Name: Melinda Kirk MRN: [id number] Date of Birth: Oct 02, 1970

## 2016-08-26 ENCOUNTER — Ambulatory Visit: Payer: 59 | Admitting: Physical Therapy

## 2016-08-26 NOTE — Telephone Encounter (Signed)
Spoke to patient and scheduled appointment for her to get shoulder injection.

## 2016-08-27 ENCOUNTER — Encounter: Payer: Self-pay | Admitting: Family Medicine

## 2016-08-27 ENCOUNTER — Ambulatory Visit: Payer: 59

## 2016-08-27 ENCOUNTER — Ambulatory Visit (INDEPENDENT_AMBULATORY_CARE_PROVIDER_SITE_OTHER): Payer: 59 | Admitting: Family Medicine

## 2016-08-27 VITALS — BP 121/79 | HR 78 | Ht 64.0 in | Wt 128.0 lb

## 2016-08-27 DIAGNOSIS — M6281 Muscle weakness (generalized): Secondary | ICD-10-CM

## 2016-08-27 DIAGNOSIS — M25611 Stiffness of right shoulder, not elsewhere classified: Secondary | ICD-10-CM | POA: Diagnosis not present

## 2016-08-27 DIAGNOSIS — R293 Abnormal posture: Secondary | ICD-10-CM

## 2016-08-27 DIAGNOSIS — M25512 Pain in left shoulder: Secondary | ICD-10-CM

## 2016-08-27 DIAGNOSIS — M25511 Pain in right shoulder: Secondary | ICD-10-CM | POA: Diagnosis not present

## 2016-08-27 MED ORDER — METHYLPREDNISOLONE ACETATE 40 MG/ML IJ SUSP
40.0000 mg | Freq: Once | INTRAMUSCULAR | Status: AC
  Administered 2016-08-27: 40 mg via INTRA_ARTICULAR

## 2016-08-27 NOTE — Assessment & Plan Note (Signed)
2/2 rotator cuff impingement.  Doing physical therapy.  Continue home exercises.  Add subacromial injection which was given today.  On plavix, aspirin s/p PFO closure.  Nitro patches.  F/u 4 weeks from now.  After informed written consent, patient was seated on exam table. Left shoulder was prepped with alcohol swab and utilizing posterior approach, patient's left subacromial space was injected with 3:1 bupivicaine: depomedrol. Patient tolerated the procedure well without immediate complications.

## 2016-08-27 NOTE — Therapy (Signed)
[facility] [address]  [place], [place], [address] Phone: [phone]   Fax:  [phone]  Physical Therapy Treatment  Patient Details  Name: Melinda Kirk MRN: [id number] Date of Birth: 1970/10/13 Referring Provider: Dr. Karlton Lemon  Encounter Date: 08/27/2016      PT End of Session - 08/27/16 1055    Visit Number 3   Number of Visits 16   Date for PT Re-Evaluation 10/15/16   PT Start Time 1051   PT Stop Time 1142   PT Time Calculation (min) 51 min   Activity Tolerance Patient tolerated treatment well;Patient limited by pain   Behavior During Therapy [facility] for tasks assessed/performed      Past Medical History:  Diagnosis Date  . Anemia 07/08/2015  . Breast calcification, left 07/29/04  . Cryptogenic stroke ([place]) 2015   a. s/p PFO closure on 07/03/16  . GERD (gastroesophageal reflux disease)   . Menorrhagia 03/07/2013  . PFO (patent foramen ovale)    a. s/p PFO closure 07/03/16  . Thyroid nodule    s/p FNA- 4/09- benign  . Unspecified hypothyroidism 01/15/2011  . Vitamin D deficiency 03/08/2011    Past Surgical History:  Procedure Laterality Date  . BIOPSY THYROID  10/2007   benign biopsy of thyroid nodules.  [name] PATENT FORAMEN OVALE CLOSURE  07/03/2016  . PATENT FORAMEN OVALE CLOSURE N/A 07/03/2016   Procedure: PFO  Closure;  Surgeon: [name], MD;  Location: [facility] [facility];  Service: Cardiovascular;  Laterality: N/A;  . TEE WITHOUT CARDIOVERSION N/A 02/10/2014   Procedure: TRANSESOPHAGEAL ECHOCARDIOGRAM (TEE);  Surgeon: [name], MD;  Location: [facility] ENDOSCOPY;  Service: Cardiovascular;  Laterality: N/A;    There were no vitals filed for this visit.      Subjective Assessment - 08/27/16 1053    Subjective Pt. reporting she had increased pain yesterday following reaching while cleaning.     Patient Stated Goals reduce pain, improve function   Currently in Pain? Yes   Pain Score 6    Pain Location Shoulder   Pain Orientation Left   Pain Descriptors / Indicators Sharp;Stabbing   Pain Type Chronic pain   Pain Onset More than a month ago   Pain Frequency Intermittent   Aggravating Factors  cleaning, lifting, movement   Pain Relieving Factors Aleve    Multiple Pain Sites No           [facility] Adult PT Treatment/Exercise - 08/27/16 1103      Shoulder Exercises: Supine   External Rotation AAROM;Left;10 reps   External Rotation Limitations Heavy cues still required for proper technique   Flexion AAROM;Left;10 reps   Flexion Limitations heavy cues still required for proper technique   ABduction AAROM;Left;10 reps   ABduction Limitations Pt. requiring less cues for this today      Shoulder Exercises: Standing   External Rotation AROM;15 reps;Theraband;Strengthening   Theraband Level (Shoulder External Rotation) Level 2 (Red)   External Rotation Limitations Towel under elbow; heavy cues required to prevent trunk rotation    Internal Rotation AROM;15 reps;Theraband;Strengthening   Theraband Level (Shoulder Internal Rotation) Level 2 (Red)   Internal Rotation Limitations towel under elbow; cues to prevent trunk rotation   Row AROM;10 reps;Theraband   Theraband Level (Shoulder Row) Level 2 (Red)   Row Limitations 5" hold    Retraction AROM;10 reps  5" hold; cues for scap. squeeze required      Shoulder Exercises: Pulleys  Flexion 3 minutes   Flexion Limitations heavy cues required to relax L shoulder    ABduction 3 minutes   ABduction Limitations Heavy cues required to relax L shoulder      Shoulder Exercises: ROM/Strengthening   UBE (Upper Arm Bike) UBE: lvl 1.0, 3 min each way            PT Short Term Goals - 08/20/16 1651      PT SHORT TERM GOAL #1   Title patient to improve L shoulder PROM equal to that of R shoulder with no increase in pain (09/17/16)           PT Long Term Goals - 08/22/16 1111      PT LONG TERM GOAL #1   Title patient to be  independent with advanced HEP (10/15/16)   Status On-going     PT LONG TERM GOAL #2   Title patient to improve L shoulder AROM equal to that of R shouler with no increase in pain (10/15/16)   Status On-going     PT LONG TERM GOAL #3   Title Patient to improve L shoulder strength to >/= 4+5 with no increase in pain (10/15/16)   Status On-going     PT LONG TERM GOAL #4   Title Patient to report return to normal ADLs and work related task with pain no greater than 1/10 for greater than 2 weeks demonstrating improved functional use of L shoulder (10/15/16)   Status On-going               Plan - 08/27/16 1058    Clinical Impression Statement Pt. reporting L shoulder pain has been worse over last few days due to flare up while lifting arm with house cleaning.  Today's treatment focused on continued HEP review with progression of scapular strengthening.  Pt. with some improvement with HEP technique however still requiring moderate cues to avoid substitutions and prevent muscular guarding with AAROM wand activities.  Pt. with less muscular guarding with pulleys today however still requiring tactile cueing throughout scapular strengthening for retraction.  Pt. with MD f/u later today and verbalized intention to receive cortisone shot from MD.  Pt. will continue to benefit from further skilled therapy to improve L UE strength, ROM, and functional capacity.     PT Treatment/Interventions ADLs/Self Care Home Management;Cryotherapy;Electrical Stimulation;Iontophoresis 4mg /ml Dexamethasone;Moist Heat;Ultrasound;Therapeutic exercise;Therapeutic activities;Patient/family education;Manual techniques;Passive range of motion;Vasopneumatic Device;Taping;Dry needling   PT Next Visit Plan Continued HEP review; gentle ROM and strengthening to tolerance      Patient will benefit from skilled therapeutic intervention in order to improve the following deficits and impairments:  Decreased activity tolerance,  Decreased range of motion, Decreased strength, Impaired UE functional use, Pain  Visit Diagnosis: Acute pain of right shoulder  Stiffness of right shoulder, not elsewhere classified  Abnormal posture  Muscle weakness (generalized)     Problem List Patient Active Problem List   Diagnosis Date Noted  . Pain in joint of left shoulder 06/30/2016  . Palpitations 04/02/2016  . Anemia 07/08/2015  . Cervical cancer screening 07/04/2015  . Dysphagia 04/05/2015  . Low back pain 11/21/2014  . Muscle spasm of back 04/11/2014  . Cryptogenic stroke ([place]) 01/27/2014  . PFO (patent foramen ovale) 01/23/2014  . Menorrhagia 03/07/2013  . Hyperlipidemia, mixed 02/08/2013  . GERD (gastroesophageal reflux disease) 02/08/2013  . Migraine 01/03/2013  . Conjunctival hemorrhage of left eye 09/23/2011  . Vitamin D deficiency 03/08/2011  . Neoplasm of uncertain behavior of skin  01/22/2011  . Thyroid nodule 01/15/2011  . Nevus 01/15/2011  . Breast calcification, left 01/15/2011    Bess Harvest, PTA 08/27/16 12:03 PM   [facility] [address]  [place], [place], [address] Phone: [phone]   Fax:  [phone]  Name: Melinda Kirk MRN: [id number] Date of Birth: Jul 09, 1971

## 2016-08-27 NOTE — Progress Notes (Signed)
PCP and consultation requested by: [name], MD  Subjective:   HPI: Patient is a 46 y.o. female here for left shoulder, low back pain.  06/27/16: Patient reports she's had 2-3 months of pain in left upper arm pain. Worse with reaching, overhead motions. + night pain. Tried massage, heat, topical medication. No radiation. No numbness or tingling. No neck pain. Also has several year history of low back pain.  Did physical therapy, anti-inflammatories. Had MRI 2 years ago showing mild spondylosis but no evidence stenosis, significant discogenic issues. She uses a back brace which helps some. No bowel/bladder dysfunction. No radiation into legs. No numbness or tingling.  08/13/16: Patient reports she feels some worse since last visit. Pain level 7/10, sharp. Motion limited trying to reach behind back. Hard to pull, lift things. Cannot lie on left side either. Some localized swelling upper arm. Gets occasional tingling and numbness left hand. No skin changes, other complaints. No neck pain.  1/30: Patient returns today for subacromial injection. Pain 7/10 level.  Past Medical History:  Diagnosis Date  . Anemia 07/08/2015  . Breast calcification, left 07/29/04  . Cryptogenic stroke ([place]) 2015   a. s/p PFO closure on 07/03/16  . GERD (gastroesophageal reflux disease)   . Menorrhagia 03/07/2013  . PFO (patent foramen ovale)    a. s/p PFO closure 07/03/16  . Thyroid nodule    s/p FNA- 4/09- benign  . Unspecified hypothyroidism 01/15/2011  . Vitamin D deficiency 03/08/2011    Current Outpatient Prescriptions on File Prior to Visit  Medication Sig Dispense Refill  . ALPRAZolam (XANAX) 0.25 MG tablet Take 1 tablet (0.25 mg total) by mouth 3 (three) times daily as needed for anxiety or sleep. 30 tablet 1  . amoxicillin (AMOXIL) 500 MG tablet Take 4 tablets by mouth one hour prior to dental appointment 8 tablet 1  . aspirin EC 81 MG tablet Take 81 mg by mouth daily.    . B  Complex-Biotin-FA (VITAMIN B50 COMPLEX PO) Take 1 tablet by mouth daily.    . Calcium Carbonate-Vitamin D (CALTRATE 600+D PO) Take 1 tablet by mouth daily.    . Cholecalciferol 2000 UNITS CAPS Take 2,000 Units by mouth daily.     . clopidogrel (PLAVIX) 75 MG tablet Take 1 tablet (75 mg total) by mouth daily. 90 tablet 3  . ferrous sulfate 325 (65 FE) MG tablet Take 325 mg by mouth daily with breakfast.    . hyoscyamine (LEVSIN SL) 0.125 MG SL tablet Place 0.125 mg under the tongue every 4 (four) hours as needed (acid reflux).    . meclizine (ANTIVERT) 25 MG tablet Take 1 tablet (25 mg total) by mouth 3 (three) times daily as needed for dizziness. 30 tablet 0  . Multiple Vitamins-Minerals (MULTIVITAMIN WITH MINERALS) tablet Take 1 tablet by mouth daily.    . naproxen sodium (ALEVE) 220 MG tablet Take 440 mg by mouth daily as needed (headache).    . nitroGLYCERIN (NITRODUR - DOSED IN MG/24 HR) 0.2 mg/hr patch Apply 1/4th patch to affected shoulder, change daily 30 patch 1  . Omega-3 Fatty Acids (EQL OMEGA 3 FISH OIL) 1400 MG CAPS Take 1,400 mg by mouth daily.     . Probiotic Product (PROBIOTIC ADVANCED) CAPS Take 1 capsule by mouth daily.    . RABEprazole (ACIPHEX) 20 MG tablet Take 1 tablet (20 mg total) by mouth daily. 30 tablet 3  . ranitidine (ZANTAC) 300 MG tablet TAKE 1 TABLET (300 MG TOTAL) BY MOUTH AT  BEDTIME. 30 tablet 1  . RESTASIS 0.05 % ophthalmic emulsion Place 1 drop into both eyes 2 (two) times daily.  4  . sucralfate (CARAFATE) 1 GM/10ML suspension Take 10 mLs (1 g total) by mouth 4 (four) times daily -  with meals and at bedtime. 420 mL 0  . Turmeric 450 MG CAPS Take 450 mg by mouth daily.     . vitamin C (ASCORBIC ACID) 500 MG tablet Take 500 mg by mouth daily.     No current facility-administered medications on file prior to visit.     Past Surgical History:  Procedure Laterality Date  . BIOPSY THYROID  10/2007   benign biopsy of thyroid nodules.  [name] PATENT FORAMEN OVALE  CLOSURE  07/03/2016  . PATENT FORAMEN OVALE CLOSURE N/A 07/03/2016   Procedure: PFO  Closure;  Surgeon: [name], MD;  Location: [facility];  Service: Cardiovascular;  Laterality: N/A;  . TEE WITHOUT CARDIOVERSION N/A 02/10/2014   Procedure: TRANSESOPHAGEAL ECHOCARDIOGRAM (TEE);  Surgeon: [name], MD;  Location: [facility] ENDOSCOPY;  Service: Cardiovascular;  Laterality: N/A;    No Known Allergies  Social History   Social History  . Marital status: Married    Spouse name: N/A  . Number of children: 3  . Years of education: N/A   Occupational History  . Nurse    Social History Main Topics  . Smoking status: Former Smoker    Packs/day: 0.25    Years: 5.00    Types: Cigarettes    Quit date: 1998  . Smokeless tobacco: Never Used  . Alcohol use Yes     Comment: 07/03/2016 "might have a few drinks/year"  . Drug use: No  . Sexual activity: Yes   Other Topics Concern  . Not on file   Social History Narrative   Regular exercise:  No   Caffeine: 1-2 cups coffee / tea daily.   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    04/05/2015 last update       Family History  Problem Relation Age of Onset  . Stroke Mother   . Arthritis Mother   . Heart disease Father     CHF?    BP 121/79   Pulse 78   Ht 5\' 4"  (1.626 m)   Wt 128 lb (58.1 kg)   BMI 21.97 kg/m   Review of Systems: See HPI above.     Objective:  Physical Exam:  Gen: NAD, comfortable in exam room Exam not repeated today. Left shoulder: No swelling, ecchymoses.  No gross deformity. No TTP. FROM with painful arc. Positive Hawkins, Neers. Negative Yergasons. Strength 5/5 with empty can and resisted internal/external rotation.  Pain empty can. Negative apprehension. NV intact distally.  Right shoulder: FROM without pain.   Assessment & Plan:  1. Left shoulder pain - 2/2 rotator cuff impingement.  Doing physical therapy.  Continue home  exercises.  Add subacromial injection which was given today.  On plavix, aspirin s/p PFO closure.  Nitro patches.  F/u 4 weeks from now.  After informed written consent, patient was seated on exam table. Left shoulder was prepped with alcohol swab and utilizing posterior approach, patient's left subacromial space was injected with 3:1 bupivicaine: depomedrol. Patient tolerated the procedure well without immediate complications.

## 2016-08-30 ENCOUNTER — Ambulatory Visit: Payer: 59 | Attending: Family Medicine

## 2016-08-30 DIAGNOSIS — R293 Abnormal posture: Secondary | ICD-10-CM | POA: Diagnosis not present

## 2016-08-30 DIAGNOSIS — M25611 Stiffness of right shoulder, not elsewhere classified: Secondary | ICD-10-CM | POA: Insufficient documentation

## 2016-08-30 DIAGNOSIS — M25511 Pain in right shoulder: Secondary | ICD-10-CM | POA: Diagnosis not present

## 2016-08-30 DIAGNOSIS — M6281 Muscle weakness (generalized): Secondary | ICD-10-CM | POA: Insufficient documentation

## 2016-08-30 NOTE — Therapy (Signed)
[facility] [address]  [place], [place], [address] Phone: [phone]   Fax:  [phone]  Physical Therapy Treatment  Patient Details  Name: Melinda Kirk MRN: [id number] Date of Birth: [birth year] Referring Provider: Dr. Karlton Lemon  Encounter Date: 08/30/2016      PT End of Session - 08/30/16 1108    Visit Number 4   Number of Visits 16   Date for PT Re-Evaluation 10/15/16   PT Start Time 1100   PT Stop Time 1149   PT Time Calculation (min) 49 min   Activity Tolerance Patient tolerated treatment well;Patient limited by pain   Behavior During Therapy [facility] for tasks assessed/performed      Past Medical History:  Diagnosis Date  . Anemia 07/08/2015  . Breast calcification, left 07/29/04  . Cryptogenic stroke ([place]) 2015   a. s/p PFO closure on 07/03/16  . GERD (gastroesophageal reflux disease)   . Menorrhagia 03/07/2013  . PFO (patent foramen ovale)    a. s/p PFO closure 07/03/16  . Thyroid nodule    s/p FNA- 4/09- benign  . Unspecified hypothyroidism 01/15/2011  . Vitamin D deficiency 03/08/2011    Past Surgical History:  Procedure Laterality Date  . BIOPSY THYROID  10/2007   benign biopsy of thyroid nodules.  [name] PATENT FORAMEN OVALE CLOSURE  07/03/2016  . PATENT FORAMEN OVALE CLOSURE N/A 07/03/2016   Procedure: PFO  Closure;  Surgeon: [name], MD;  Location: [place];  Service: Cardiovascular;  Laterality: N/A;  . TEE WITHOUT CARDIOVERSION N/A 02/10/2014   Procedure: TRANSESOPHAGEAL ECHOCARDIOGRAM (TEE);  Surgeon: [name], MD;  Location: [facility] ENDOSCOPY;  Service: Cardiovascular;  Laterality: N/A;    There were no vitals filed for this visit.      Subjective Assessment - 08/30/16 1103    Subjective Pt. reporting her shoulder doesn't have much pain today.  Chief complaint is L lateral arm today.  Pt. receiving a corticosteroid injection on 1.30.18 which she has noted limited  benefit from.     Patient Stated Goals reduce pain, improve function   Currently in Pain? Yes   Pain Score 4    Pain Location Arm   Pain Orientation Left;Lateral   Pain Descriptors / Indicators Dull  bruised feeling    Pain Type Chronic pain   Pain Onset More than a month ago   Pain Frequency Intermittent   Multiple Pain Sites No            [facility] Adult PT Treatment/Exercise - 08/30/16 1116      Shoulder Exercises: Supine   Other Supine Exercises Supine laying on 1/2 foam bolster chest stretch x 1 min; pt. only able to perform low chest stretch due to pain with other versions    Other Supine Exercises Supine laying on 1/2 foam bolster scapular retraction 5' hold x 10 reps      Shoulder Exercises: Sidelying   External Rotation AROM;10 reps   External Rotation Limitations R sidelying L shoulder ER     Shoulder Exercises: Standing   External Rotation AROM;Theraband;Strengthening;5 reps;Both   Theraband Level (Shoulder External Rotation) Level 1 (Yellow)   External Rotation Limitations activity terminated due to report of pain     Shoulder Exercises: Pulleys   Flexion 2 minutes   Flexion Limitations mod cues required to relax L shoulder    ABduction 2 minutes   ABduction Limitations mod cues required for L shoulder to relax  Manual Therapy   Manual Therapy Passive ROM;Soft tissue mobilization;Myofascial release   Soft tissue mobilization STM to L UT and anterior/posterior musculature to promote relaxation and reduce muscular guarding   Myofascial Release to medial scapular border   Passive ROM gentle PROM in all directions; pt. very guarded in all directions reporting pain in all directions            PT Short Term Goals - 08/20/16 1651      PT SHORT TERM GOAL #1   Title patient to improve L shoulder PROM equal to that of R shoulder with no increase in pain (09/17/16)           PT Long Term Goals - 08/22/16 1111      PT LONG TERM GOAL #1   Title patient  to be independent with advanced HEP (10/15/16)   Status On-going     PT LONG TERM GOAL #2   Title patient to improve L shoulder AROM equal to that of R shouler with no increase in pain (10/15/16)   Status On-going     PT LONG TERM GOAL #3   Title Patient to improve L shoulder strength to >/= 4+5 with no increase in pain (10/15/16)   Status On-going     PT LONG TERM GOAL #4   Title Patient to report return to normal ADLs and work related task with pain no greater than 1/10 for greater than 2 weeks demonstrating improved functional use of L shoulder (10/15/16)   Status On-going           Plan - 08/30/16 1114    Clinical Impression Statement Pt. with poor tolerance for conservative L shoulder/scapular strengthening today.  Pt. requiring frequent breaks with AAROM therex due to report of L shoulder pain.  L shoulder remains guarded with all AAROM/PROM today.  Pt. tight to palpation in L UT however with good relaxation with STM to L shoulder/scapular musculature.  Pt. received L shoulder corticosteroid injection from MD on 1.30.18 and has noted limited benefit from this so far.  Pt. verbalizing apprehension of RTC tear today in treatment and frequently verbalizes apprehension in regards to L shoulder status.  Will plan to discuss status of shoulder with PT and have PT reach out to pt. in regards to L shoulder status to address concerns.     PT Treatment/Interventions ADLs/Self Care Home Management;Cryotherapy;Electrical Stimulation;Iontophoresis 4mg /ml Dexamethasone;Moist Heat;Ultrasound;Therapeutic exercise;Therapeutic activities;Patient/family education;Manual techniques;Passive range of motion;Vasopneumatic Device;Taping;Dry needling   PT Next Visit Plan Continued HEP review; gentle ROM and strengthening to tolerance      Patient will benefit from skilled therapeutic intervention in order to improve the following deficits and impairments:  Decreased activity tolerance, Decreased range of motion,  Decreased strength, Impaired UE functional use, Pain  Visit Diagnosis: Acute pain of right shoulder  Stiffness of right shoulder, not elsewhere classified  Abnormal posture  Muscle weakness (generalized)     Problem List Patient Active Problem List   Diagnosis Date Noted  . Pain in joint of left shoulder 06/30/2016  . Palpitations 04/02/2016  . Anemia 07/08/2015  . Cervical cancer screening 07/04/2015  . Dysphagia 04/05/2015  . Low back pain 11/21/2014  . Muscle spasm of back 04/11/2014  . Cryptogenic stroke ([place]) 01/27/2014  . PFO (patent foramen ovale) 01/23/2014  . Menorrhagia 03/07/2013  . Hyperlipidemia, mixed 02/08/2013  . GERD (gastroesophageal reflux disease) 02/08/2013  . Migraine 01/03/2013  . Conjunctival hemorrhage of left eye 09/23/2011  . Vitamin D deficiency 03/08/2011  .  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 Thyroid nodule 01/15/2011  . Nevus 01/15/2011  . Breast calcification, left 01/15/2011    Bess Harvest, PTA 08/30/16 12:23 PM  [facility] [address]  [place], [place], [address] Phone: [phone]   Fax:  [phone]  Name: TAMSYN TRISSEL MRN: [id number] Date of Birth: August 16, 1970

## 2016-09-03 ENCOUNTER — Ambulatory Visit: Payer: 59 | Admitting: Physical Therapy

## 2016-09-04 ENCOUNTER — Ambulatory Visit: Payer: 59 | Admitting: Physical Therapy

## 2016-09-04 DIAGNOSIS — M6281 Muscle weakness (generalized): Secondary | ICD-10-CM | POA: Diagnosis not present

## 2016-09-04 DIAGNOSIS — R293 Abnormal posture: Secondary | ICD-10-CM

## 2016-09-04 DIAGNOSIS — M25611 Stiffness of right shoulder, not elsewhere classified: Secondary | ICD-10-CM | POA: Diagnosis not present

## 2016-09-04 DIAGNOSIS — M25511 Pain in right shoulder: Secondary | ICD-10-CM | POA: Diagnosis not present

## 2016-09-04 NOTE — Therapy (Signed)
[facility] [address]  [place], [place], [address] Phone: [phone]   Fax:  [phone]  Physical Therapy Treatment  Patient Details  Name: Melinda Kirk MRN: [id number] Date of Birth: 09/17/1970 Referring Provider: Dr. Karlton Lemon  Encounter Date: 09/04/2016      PT End of Session - 09/04/16 1358    Visit Number 5   Number of Visits 16   Date for PT Re-Evaluation 10/15/16   PT Start Time 1314   PT Stop Time 1408   PT Time Calculation (min) 54 min   Activity Tolerance Patient tolerated treatment well;Patient limited by pain   Behavior During Therapy [facility] for tasks assessed/performed      Past Medical History:  Diagnosis Date  . Anemia 07/08/2015  . Breast calcification, left 07/29/04  . 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 s/p PFO closure on 07/03/16  . GERD (gastroesophageal reflux disease)   . Menorrhagia 03/07/2013  . PFO (patent foramen ovale)    a. s/p PFO closure 07/03/16  . Thyroid nodule    s/p FNA- 4/09- benign  . Unspecified hypothyroidism 01/15/2011  . Vitamin D deficiency 03/08/2011    Past Surgical History:  Procedure Laterality Date  . BIOPSY THYROID  10/2007   benign biopsy of thyroid nodules.  [name] PATENT FORAMEN OVALE CLOSURE  07/03/2016  . PATENT FORAMEN OVALE CLOSURE N/A 07/03/2016   Procedure: PFO  Closure;  Surgeon: [name], MD;  Location: [place] [facility];  Service: Cardiovascular;  Laterality: N/A;  . TEE WITHOUT CARDIOVERSION N/A 02/10/2014   Procedure: TRANSESOPHAGEAL ECHOCARDIOGRAM (TEE);  Surgeon: [name], MD;  Location: [facility] ENDOSCOPY;  Service: Cardiovascular;  Laterality: N/A;    There were no vitals filed for this visit.      Subjective Assessment - 09/04/16 1356    Subjective Patient with no pain during sitting - hurts with active movements - feels like numbness and tingling is diminshing in hand.   Patient Stated Goals reduce pain, improve function   Currently in Pain? No/denies   Pain Score 0-No pain  0/10 sitting; up to 6/10 with movements   Pain Location Shoulder   Pain Orientation Left   Pain Descriptors / Indicators Tightness;Sore   Pain Type Chronic pain   Pain Onset More than a month ago   Pain Frequency Intermittent   Aggravating Factors  cleaning, lifting, movement   Pain Relieving Factors Aleve                         [facility] Adult PT Treatment/Exercise - 09/04/16 0001      Modalities   Modalities Electrical Stimulation;Moist Heat     Moist Heat Therapy   Number Minutes Moist Heat 15 Minutes   Moist Heat Location Shoulder     Electrical Stimulation   Electrical Stimulation Location R shoulder   Electrical Stimulation Action IFC   Electrical Stimulation Parameters to tolerance   Electrical Stimulation Goals Pain     Manual Therapy   Manual Therapy Joint mobilization;Soft tissue mobilization;Passive ROM   Joint Mobilization Grade II-III GH joint mobs - posterio and inferior for approx 4 x 30 seconds each; long axis distraction in open pack position 3 x 30 seconds   Soft tissue mobilization STM to biceps tendon, bicep muscle belly,    Passive ROM gentle PROM into flexion, abduction, ER and IR - greatest limitations into abduction and IR - patient very guarded  with all PROM before end range                  PT Short Term Goals - 08/20/16 1651      PT SHORT TERM GOAL #1   Title patient to improve L shoulder PROM equal to that of R shoulder with no increase in pain (09/17/16)           PT Long Term Goals - 08/22/16 1111      PT LONG TERM GOAL #1   Title patient to be independent with advanced HEP (10/15/16)   Status On-going     PT LONG TERM GOAL #2   Title patient to improve L shoulder AROM equal to that of R shouler with no increase in pain (10/15/16)   Status On-going     PT LONG TERM GOAL #3   Title Patient to improve L shoulder strength to >/= 4+5 with no increase in pain  (10/15/16)   Status On-going     PT LONG TERM GOAL #4   Title Patient to report return to normal ADLs and work related task with pain no greater than 1/10 for greater than 2 weeks demonstrating improved functional use of L shoulder (10/15/16)   Status On-going               Plan - 09/04/16 1358    Clinical Warren doing well today - subjective reports of little to no pain when just sitting, however pain greatly increased with activity and movement. Patient stating injection was of no benefit. PT session today focusing on manual therapy for PROM and gentle joint mobilizations as patient has been demonstrating reduced tolerance to AAROM with cane as well as strengthening tasks. PT questioning impingement vs frozen shoulder at this point as PROM is very limited and painful with greatest ROM deficits seen in abduction and internal rotation. Patient to continue to benefit from PT to maximize functional use of L UE.    PT Treatment/Interventions ADLs/Self Care Home Management;Cryotherapy;Electrical Stimulation;Iontophoresis 4mg /ml Dexamethasone;Moist Heat;Ultrasound;Therapeutic exercise;Therapeutic activities;Patient/family education;Manual techniques;Passive range of motion;Vasopneumatic Device;Taping;Dry needling   PT Next Visit Plan gentle ROM per patient tolerance   Consulted and Agree with Plan of Care Patient      Patient will benefit from skilled therapeutic intervention in order to improve the following deficits and impairments:  Decreased activity tolerance, Decreased range of motion, Decreased strength, Impaired UE functional use, Pain  Visit Diagnosis: Acute pain of right shoulder  Stiffness of right shoulder, not elsewhere classified  Abnormal posture  Muscle weakness (generalized)     Problem List Patient Active Problem List   Diagnosis Date Noted  . Pain in joint of left shoulder 06/30/2016  . Palpitations 04/02/2016  . Anemia 07/08/2015  . Cervical  cancer screening 07/04/2015  . Dysphagia 04/05/2015  . Low back pain 11/21/2014  . Muscle spasm of back 04/11/2014  . Cryptogenic stroke ([place]) 01/27/2014  . PFO (patent foramen ovale) 01/23/2014  . Menorrhagia 03/07/2013  . Hyperlipidemia, mixed 02/08/2013  . GERD (gastroesophageal reflux disease) 02/08/2013  . Migraine 01/03/2013  . Conjunctival hemorrhage of left eye 09/23/2011  . Vitamin D deficiency 03/08/2011  . 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 Thyroid nodule 01/15/2011  . Nevus 01/15/2011  . Breast calcification, left 01/15/2011    Lanney Gins, PT, DPT 09/04/16 3:25 PM   [facility] [address]  [address] [place], [place], [address] Phone: [phone]  Fax:  [phone]  Name: Melinda Kirk MRN: [id number] Date of Birth: [birth year]

## 2016-09-05 ENCOUNTER — Ambulatory Visit: Payer: 59 | Admitting: Physical Therapy

## 2016-09-05 DIAGNOSIS — R293 Abnormal posture: Secondary | ICD-10-CM | POA: Diagnosis not present

## 2016-09-05 DIAGNOSIS — M25611 Stiffness of right shoulder, not elsewhere classified: Secondary | ICD-10-CM | POA: Diagnosis not present

## 2016-09-05 DIAGNOSIS — M25511 Pain in right shoulder: Secondary | ICD-10-CM

## 2016-09-05 DIAGNOSIS — M6281 Muscle weakness (generalized): Secondary | ICD-10-CM | POA: Diagnosis not present

## 2016-09-05 NOTE — Therapy (Signed)
[facility] [address]  [place], [place], [address] Phone: [phone]   Fax:  [phone]  Physical Therapy Treatment  Patient Details  Name: Melinda Kirk MRN: [id number] Date of Birth: 1971-06-12 Referring Provider: Dr. Karlton Lemon  Encounter Date: 09/05/2016      PT End of Session - 09/05/16 1148    Visit Number 6   Number of Visits 16   Date for PT Re-Evaluation 10/15/16   PT Start Time 0932   PT Stop Time 1033   PT Time Calculation (min) 61 min   Activity Tolerance Patient tolerated treatment well   Behavior During Therapy [facility] for tasks assessed/performed      Past Medical History:  Diagnosis Date  . Anemia 07/08/2015  . Breast calcification, left 07/29/04  . 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 s/p PFO closure on 07/03/16  . GERD (gastroesophageal reflux disease)   . Menorrhagia 03/07/2013  . PFO (patent foramen ovale)    a. s/p PFO closure 07/03/16  . Thyroid nodule    s/p FNA- 4/09- benign  . Unspecified hypothyroidism 01/15/2011  . Vitamin D deficiency 03/08/2011    Past Surgical History:  Procedure Laterality Date  . BIOPSY THYROID  10/2007   benign biopsy of thyroid nodules.  [name] PATENT FORAMEN OVALE CLOSURE  07/03/2016  . PATENT FORAMEN OVALE CLOSURE N/A 07/03/2016   Procedure: PFO  Closure;  Surgeon: [name], MD;  Location: [place] [facility];  Service: Cardiovascular;  Laterality: N/A;  . TEE WITHOUT CARDIOVERSION N/A 02/10/2014   Procedure: TRANSESOPHAGEAL ECHOCARDIOGRAM (TEE);  Surgeon: [name], MD;  Location: [facility] ENDOSCOPY;  Service: Cardiovascular;  Laterality: N/A;    There were no vitals filed for this visit.      Subjective Assessment - 09/05/16 1147    Subjective reports of pain/tightness in shoulder and L side of neck - feels tense   Patient Stated Goals reduce pain, improve function   Currently in Pain? No/denies   Pain Score 0-No pain  10/10 pain in shower with  movements - difficulty bathing self                         [facility] Adult PT Treatment/Exercise - 09/05/16 1158      Modalities   Modalities Electrical Stimulation;Moist Heat     Moist Heat Therapy   Number Minutes Moist Heat 15 Minutes   Moist Heat Location Shoulder     Electrical Stimulation   Electrical Stimulation Location R shoulder   Electrical Stimulation Action IFC   Electrical Stimulation Parameters to tolerance   Electrical Stimulation Goals Pain     Manual Therapy   Manual Therapy Joint mobilization;Soft tissue mobilization;Passive ROM   Joint Mobilization Grade I-II GH joint mobs in posterior and inferior directions - approx 3 x 30 seconds each   Soft tissue mobilization STM to L deltoid, L bicep tendon and L lateral scapula   Passive ROM gentle PROM into flexion, abduction, ER and IR for approx 10 x 15" holds - minimal overpressure to prevent pain as well as overall flare up. Patient continue to be heavily guarded.                   PT Short Term Goals - 09/05/16 1149      PT SHORT TERM GOAL #1   Title patient to improve L shoulder PROM equal to that of R shoulder with no  increase in pain (09/17/16)   Status On-going           PT Long Term Goals - 09/05/16 1149      PT LONG TERM GOAL #1   Title patient to be independent with advanced HEP (10/15/16)   Status On-going     PT LONG TERM GOAL #2   Title patient to improve L shoulder AROM equal to that of R shouler with no increase in pain (10/15/16)   Status On-going     PT LONG TERM GOAL #3   Title Patient to improve L shoulder strength to >/= 4+5 with no increase in pain (10/15/16)   Status On-going     PT LONG TERM GOAL #4   Title Patient to report return to normal ADLs and work related task with pain no greater than 1/10 for greater than 2 weeks demonstrating improved functional use of L shoulder (10/15/16)   Status On-going               Plan - 09/05/16 1149    Clinical  Impression Statement Patient today with continued excessive tightness and pain with all PROM in all planes. PT requesting colleague to also assess patients motion, with both in agreement of signs and symptoms consistent with frozen shoulder. PT call to CMA of MD to make aware of patient presentation pattern. PT session continuing to focus more on gentle stretching and Grade I-II joint mobs of L glenohumeral joint for pain relief as well as to maintain motion patient currently has without further loss. Patient to continue to benefit from PT to maximize function and mobility of L UE.    PT Treatment/Interventions ADLs/Self Care Home Management;Cryotherapy;Electrical Stimulation;Iontophoresis 4mg /ml Dexamethasone;Moist Heat;Ultrasound;Therapeutic exercise;Therapeutic activities;Patient/family education;Manual techniques;Passive range of motion;Vasopneumatic Device;Taping;Dry needling   PT Next Visit Plan gentle ROM per patient tolerance   Consulted and Agree with Plan of Care Patient      Patient will benefit from skilled therapeutic intervention in order to improve the following deficits and impairments:  Decreased activity tolerance, Decreased range of motion, Decreased strength, Impaired UE functional use, Pain  Visit Diagnosis: Acute pain of right shoulder  Stiffness of right shoulder, not elsewhere classified  Abnormal posture  Muscle weakness (generalized)     Problem List Patient Active Problem List   Diagnosis Date Noted  . Pain in joint of left shoulder 06/30/2016  . Palpitations 04/02/2016  . Anemia 07/08/2015  . Cervical cancer screening 07/04/2015  . Dysphagia 04/05/2015  . Low back pain 11/21/2014  . Muscle spasm of back 04/11/2014  . Cryptogenic stroke ([place]) 01/27/2014  . PFO (patent foramen ovale) 01/23/2014  . Menorrhagia 03/07/2013  . Hyperlipidemia, mixed 02/08/2013  . GERD (gastroesophageal reflux disease) 02/08/2013  . Migraine 01/03/2013  . Conjunctival  hemorrhage of left eye 09/23/2011  . Vitamin D deficiency 03/08/2011  . 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 Thyroid nodule 01/15/2011  . Nevus 01/15/2011  . Breast calcification, left 01/15/2011    Lanney Gins, PT, DPT 09/05/16 12:02 PM   [facility] [address]  [place], [place], [address] Phone: [phone]   Fax:  [phone]  Name: SHIRYL WALKUP MRN: [id number] Date of Birth: 1970/09/12

## 2016-09-10 ENCOUNTER — Ambulatory Visit: Payer: 59 | Admitting: Physical Therapy

## 2016-09-10 DIAGNOSIS — M6281 Muscle weakness (generalized): Secondary | ICD-10-CM | POA: Diagnosis not present

## 2016-09-10 DIAGNOSIS — M25511 Pain in right shoulder: Secondary | ICD-10-CM

## 2016-09-10 DIAGNOSIS — M25611 Stiffness of right shoulder, not elsewhere classified: Secondary | ICD-10-CM | POA: Diagnosis not present

## 2016-09-10 DIAGNOSIS — R293 Abnormal posture: Secondary | ICD-10-CM | POA: Diagnosis not present

## 2016-09-10 NOTE — Therapy (Signed)
[facility] [address]  [place], [place], [address] Phone: [phone]   Fax:  [phone]  Physical Therapy Treatment  Patient Details  Name: Melinda Kirk MRN: [id number] Date of Birth: 1971-04-25 Referring Provider: Dr. Karlton Lemon  Encounter Date: 09/10/2016      PT End of Session - 09/10/16 1337    Visit Number 7   Number of Visits 16   Date for PT Re-Evaluation 10/15/16   PT Start Time 0932   PT Stop Time 1026  moist heat   PT Time Calculation (min) 54 min   Activity Tolerance Patient tolerated treatment well;Patient limited by pain   Behavior During Therapy [facility] for tasks assessed/performed      Past Medical History:  Diagnosis Date  . Anemia 07/08/2015  . Breast calcification, left 07/29/04  . 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 s/p PFO closure on 07/03/16  . GERD (gastroesophageal reflux disease)   . Menorrhagia 03/07/2013  . PFO (patent foramen ovale)    a. s/p PFO closure 07/03/16  . Thyroid nodule    s/p FNA- 4/09- benign  . Unspecified hypothyroidism 01/15/2011  . Vitamin D deficiency 03/08/2011    Past Surgical History:  Procedure Laterality Date  . BIOPSY THYROID  10/2007   benign biopsy of thyroid nodules.  [name] PATENT FORAMEN OVALE CLOSURE  07/03/2016  . PATENT FORAMEN OVALE CLOSURE N/A 07/03/2016   Procedure: PFO  Closure;  Surgeon: [name], MD;  Location: [place] [facility];  Service: Cardiovascular;  Laterality: N/A;  . TEE WITHOUT CARDIOVERSION N/A 02/10/2014   Procedure: TRANSESOPHAGEAL ECHOCARDIOGRAM (TEE);  Surgeon: [name], MD;  Location: [facility] ENDOSCOPY;  Service: Cardiovascular;  Laterality: N/A;    There were no vitals filed for this visit.      Subjective Assessment - 09/10/16 1331    Subjective Continues to have pain, limited with picking up objects; feels like are is swollen today - wants to go to another MD for second opinion; starting to have some neck and  shoulder pain   Patient Stated Goals reduce pain, improve function   Currently in Pain? No/denies   Pain Score 0-No pain  0/10 currently; 8/10 with movement/lifting   Pain Location Shoulder   Pain Orientation Left   Pain Descriptors / Indicators Aching;Sore   Pain Type Chronic pain   Pain Onset More than a month ago   Pain Frequency Intermittent   Aggravating Factors  cleaning, lifting, movement   Pain Relieving Factors Aleve                         [facility] Adult PT Treatment/Exercise - 09/10/16 0001      Modalities   Modalities Moist Heat     Moist Heat Therapy   Number Minutes Moist Heat 15 Minutes   Moist Heat Location Shoulder     Electrical Stimulation   Electrical Stimulation Location L shoulder   Electrical Stimulation Action ICF   Electrical Stimulation Parameters to tolerance   Electrical Stimulation Goals Pain     Manual Therapy   Manual Therapy Joint mobilization;Soft tissue mobilization;Passive ROM   Joint Mobilization Grade I-II GH joint mobs in posterior and inferior directions - approx 3 x 30 seconds each - mild pain   Soft tissue mobilization STM to L UT, L cervical paraspinals, L levator scap, L bicep insertion   Passive ROM gentle PROM into flexion, abduction, ER and IR  for approx 10 x 15" holds - minimal overpressure to prevent pain as well as overall flare up. cervical lateral flexion to R for L UT stretch - no overpressure as patient reports "good stretch" with this.                   PT Short Term Goals - 09/05/16 1149      PT SHORT TERM GOAL #1   Title patient to improve L shoulder PROM equal to that of R shoulder with no increase in pain (09/17/16)   Status On-going           PT Long Term Goals - 09/05/16 1149      PT LONG TERM GOAL #1   Title patient to be independent with advanced HEP (10/15/16)   Status On-going     PT LONG TERM GOAL #2   Title patient to improve L shoulder AROM equal to that of R shouler with  no increase in pain (10/15/16)   Status On-going     PT LONG TERM GOAL #3   Title Patient to improve L shoulder strength to >/= 4+5 with no increase in pain (10/15/16)   Status On-going     PT LONG TERM GOAL #4   Title Patient to report return to normal ADLs and work related task with pain no greater than 1/10 for greater than 2 weeks demonstrating improved functional use of L shoulder (10/15/16)   Status On-going               Plan - 09/10/16 1338    Clinical Impression Statement PT conintuing to focus solely on PROM of L shoulder, GH mobilizations, as well as STM to maintain current ROM to prevent loss of further motion as well as overall pain releif. Per patient report, she feels "bruised and swollen" in L deltoid region. Today, patient reporting an increase in L neck/shoulder pain likely due to patient heavily gurading shoulder and placing neck/upper back musculature in shortened positions. STM and stretching to this area with good patient tolerance.    PT Treatment/Interventions ADLs/Self Care Home Management;Cryotherapy;Electrical Stimulation;Iontophoresis 4mg /ml Dexamethasone;Moist Heat;Ultrasound;Therapeutic exercise;Therapeutic activities;Patient/family education;Manual techniques;Passive range of motion;Vasopneumatic Device;Taping;Dry needling   PT Next Visit Plan gentle ROM per patient tolerance   Consulted and Agree with Plan of Care Patient      Patient will benefit from skilled therapeutic intervention in order to improve the following deficits and impairments:  Decreased activity tolerance, Decreased range of motion, Decreased strength, Impaired UE functional use, Pain  Visit Diagnosis: Acute pain of right shoulder  Stiffness of right shoulder, not elsewhere classified  Abnormal posture  Muscle weakness (generalized)     Problem List Patient Active Problem List   Diagnosis Date Noted  . Pain in joint of left shoulder 06/30/2016  . Palpitations 04/02/2016  .  Anemia 07/08/2015  . Cervical cancer screening 07/04/2015  . Dysphagia 04/05/2015  . Low back pain 11/21/2014  . Muscle spasm of back 04/11/2014  . Cryptogenic stroke ([place]) 01/27/2014  . PFO (patent foramen ovale) 01/23/2014  . Menorrhagia 03/07/2013  . Hyperlipidemia, mixed 02/08/2013  . GERD (gastroesophageal reflux disease) 02/08/2013  . Migraine 01/03/2013  . Conjunctival hemorrhage of left eye 09/23/2011  . Vitamin D deficiency 03/08/2011  . 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 Thyroid nodule 01/15/2011  . Nevus 01/15/2011  . Breast calcification, left 01/15/2011     Lanney Gins, PT, DPT 09/10/16 1:45 PM   [facility]  [facility] [address]  [place], [place], [address] Phone: [phone]   Fax:  [phone]  Name: TRENIECE KOHLHOFF MRN: [id number] Date of Birth: 10/17/1970

## 2016-09-12 ENCOUNTER — Ambulatory Visit: Payer: 59 | Admitting: Physical Therapy

## 2016-09-12 ENCOUNTER — Ambulatory Visit (INDEPENDENT_AMBULATORY_CARE_PROVIDER_SITE_OTHER): Payer: 59

## 2016-09-12 ENCOUNTER — Encounter: Payer: Self-pay | Admitting: Sports Medicine

## 2016-09-12 ENCOUNTER — Ambulatory Visit (INDEPENDENT_AMBULATORY_CARE_PROVIDER_SITE_OTHER): Payer: 59 | Admitting: Sports Medicine

## 2016-09-12 DIAGNOSIS — M25562 Pain in left knee: Secondary | ICD-10-CM | POA: Insufficient documentation

## 2016-09-12 DIAGNOSIS — M7502 Adhesive capsulitis of left shoulder: Secondary | ICD-10-CM

## 2016-09-12 DIAGNOSIS — M25512 Pain in left shoulder: Secondary | ICD-10-CM | POA: Diagnosis not present

## 2016-09-12 DIAGNOSIS — M25561 Pain in right knee: Secondary | ICD-10-CM | POA: Diagnosis not present

## 2016-09-12 DIAGNOSIS — M25462 Effusion, left knee: Secondary | ICD-10-CM | POA: Diagnosis not present

## 2016-09-12 DIAGNOSIS — G8929 Other chronic pain: Secondary | ICD-10-CM

## 2016-09-12 NOTE — Assessment & Plan Note (Signed)
X-rays, rehabilitation exercises given. Return in one month.

## 2016-09-12 NOTE — Progress Notes (Signed)
   Subjective:    I'm seeing this patient as a consultation for:  Dr. 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 She did have a subacromial injection with Dr. Barbaraann Barthel that unfortunately didn't provide much relief. She's also been doing formal physical therapy without much improvement.  For the past several days she's also complaining of pain she localizes on the anterior aspect of the left knee, moderate, persistent without radiation.  Past medical history:  Negative.  See flowsheet/record as well for more information.  Surgical history: Negative.  See flowsheet/record as well for more information.  Family history: Negative.  See flowsheet/record as well for more information.  Social history: Negative.  See flowsheet/record as well for more information.  Allergies, and medications have been entered into the medical record, reviewed, and no changes needed.   Review of Systems: No headache, visual changes, nausea, vomiting, diarrhea, constipation, dizziness, abdominal pain, skin rash, fevers, chills, night sweats, weight loss, swollen lymph nodes, body aches, joint swelling, muscle aches, chest pain, shortness of breath, mood changes, visual or auditory hallucinations.   Objective:   General: Well Developed, well nourished, and in no acute distress.  Neuro/Psych: Alert and oriented x3, extra-ocular muscles intact, able to move all 4 extremities, sensation grossly intact. Skin: Warm and dry, no rashes noted.  Respiratory: Not using accessory muscles, speaking in full sentences, trachea midline.  Cardiovascular: Pulses palpable, no extremity edema. Abdomen: Does not appear distended. Left shoulder: Minimally tender to palpation but very little range of motion particularly external rotation, she has about 5  of external rotation on the left side, and 90 on the right. Abduction passively to 20 with scapular stabilization. Left Knee: Normal to inspection with no erythema or effusion or obvious bony abnormalities. Palpation normal with no warmth or joint line tenderness or patellar tenderness or condyle tenderness. ROM normal in flexion and extension and lower leg rotation. Ligaments with solid consistent endpoints including ACL, PCL, LCL, MCL. Negative Mcmurray's and provocative meniscal tests. Minimally painful patellar compression with crepitus Patellar and quadriceps tendons unremarkable. Hamstring and quadriceps strength is normal.  Procedure: Real-time Ultrasound Guided Injection of left glenohumeral joint Device: GE Logiq E  Verbal informed consent obtained.  Time-out conducted.  Noted no overlying erythema, induration, or other signs of local infection.  Skin prepped in a sterile fashion.  Local anesthesia: Topical Ethyl chloride.  With sterile technique and under real time ultrasound guidance:  22-gauge spinal needle advanced into the joint and 1 mL kenalog 40, 2 mL lidocaine, 7 mL Marcaine injected easily Completed without difficulty  Pain immediately resolved suggesting accurate placement of the medication.  Advised to call if fevers/chills, erythema, induration, drainage, or persistent bleeding.  Images permanently stored and available for review in the ultrasound unit.  Impression: Technically successful ultrasound guided injection.  Impression and Recommendations:   This case required medical decision making of moderate complexity.  Adhesive capsulitis of left shoulder High-volume glenohumeral capsular distention. Continue aggressive physical therapy. Symptoms have been present long enough at this point to consider x-ray and MRI.  Pain of left patellofemoral joint X-rays, rehabilitation exercises given. Return in one month.

## 2016-09-12 NOTE — Assessment & Plan Note (Signed)
High-volume glenohumeral capsular distention. Continue aggressive physical therapy. Symptoms have been present long enough at this point to consider x-ray and MRI.

## 2016-09-17 ENCOUNTER — Ambulatory Visit: Payer: 59 | Admitting: Physical Therapy

## 2016-09-17 DIAGNOSIS — R293 Abnormal posture: Secondary | ICD-10-CM | POA: Diagnosis not present

## 2016-09-17 DIAGNOSIS — M6281 Muscle weakness (generalized): Secondary | ICD-10-CM

## 2016-09-17 DIAGNOSIS — M25611 Stiffness of right shoulder, not elsewhere classified: Secondary | ICD-10-CM | POA: Diagnosis not present

## 2016-09-17 DIAGNOSIS — M25511 Pain in right shoulder: Secondary | ICD-10-CM | POA: Diagnosis not present

## 2016-09-17 NOTE — Therapy (Signed)
[facility] [address]  [place], [place], [address] Phone: [phone]   Fax:  [phone]  Physical Therapy Treatment  Patient Details  Name: Melinda Kirk MRN: [id number] Date of Birth: 1971/04/14 Referring Provider: Dr. Karlton Lemon  Encounter Date: 09/17/2016      PT End of Session - 09/17/16 1542    Visit Number 8   Number of Visits 16   Date for PT Re-Evaluation 10/15/16   PT Start Time 1020   PT Stop Time 1115   PT Time Calculation (min) 55 min   Activity Tolerance Patient tolerated treatment well;Patient limited by pain   Behavior During Therapy [facility] for tasks assessed/performed      Past Medical History:  Diagnosis Date  . Anemia 07/08/2015  . Breast calcification, left 07/29/04  . 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 s/p PFO closure on 07/03/16  . GERD (gastroesophageal reflux disease)   . Menorrhagia 03/07/2013  . PFO (patent foramen ovale)    a. s/p PFO closure 07/03/16  . Thyroid nodule    s/p FNA- 4/09- benign  . Unspecified hypothyroidism 01/15/2011  . Vitamin D deficiency 03/08/2011    Past Surgical History:  Procedure Laterality Date  . BIOPSY THYROID  10/2007   benign biopsy of thyroid nodules.  [name] PATENT FORAMEN OVALE CLOSURE  07/03/2016  . PATENT FORAMEN OVALE CLOSURE N/A 07/03/2016   Procedure: PFO  Closure;  Surgeon: [name], MD;  Location: [facility] [facility];  Service: Cardiovascular;  Laterality: N/A;  . TEE WITHOUT CARDIOVERSION N/A 02/10/2014   Procedure: TRANSESOPHAGEAL ECHOCARDIOGRAM (TEE);  Surgeon: [name], MD;  Location: [facility] ENDOSCOPY;  Service: Cardiovascular;  Laterality: N/A;    There were no vitals filed for this visit.      Subjective Assessment - 09/17/16 1541    Subjective Saw Dr. Darene Lamer last week - did an injection - wants patient to continue PT.    Patient Stated Goals reduce pain, improve function   Currently in Pain? Yes   Pain Score 6    Pain  Location Shoulder   Pain Orientation Left   Pain Descriptors / Indicators Aching;Sore   Pain Type Chronic pain   Pain Onset More than a month ago   Pain Frequency Intermittent   Aggravating Factors  clenaing, lifting, movement   Pain Relieving Factors Aleve                         [facility] Adult PT Treatment/Exercise - 09/17/16 1543      Shoulder Exercises: Standing   Flexion AAROM;Left;15 reps   Flexion Limitations wall ladder - VC for gentle stretch   ABduction AAROM;Left;15 reps   ABduction Limitations wall ladder - VC for gentle stretch   Other Standing Exercises counter walkouts 10 x 10 second hold   Other Standing Exercises IR towel stretch - much cueing needed for gentle stretch - trying to remain pain free - reduced carryover     Shoulder Exercises: Pulleys   Flexion 3 minutes   Flexion Limitations VC for gentle stretch   ABduction 3 minutes   ABduction Limitations VC for gentle stretch     Modalities   Modalities Moist Heat     Moist Heat Therapy   Number Minutes Moist Heat 15 Minutes   Moist Heat Location Shoulder     Electrical Stimulation   Electrical Stimulation Location L shoulder   Electrical Stimulation Action IFC  Electrical Stimulation Parameters to tolerance   Electrical Stimulation Goals Pain     Manual Therapy   Manual Therapy Passive ROM   Passive ROM gentle PROM into flexion, abduction, ER and IR for approx 10 x 15" holds - minimal overpressure to prevent pain                  PT Short Term Goals - 09/05/16 1149      PT SHORT TERM GOAL #1   Title patient to improve L shoulder PROM equal to that of R shoulder with no increase in pain (09/17/16)   Status On-going           PT Long Term Goals - 09/05/16 1149      PT LONG TERM GOAL #1   Title patient to be independent with advanced HEP (10/15/16)   Status On-going     PT LONG TERM GOAL #2   Title patient to improve L shoulder AROM equal to that of R shouler with  no increase in pain (10/15/16)   Status On-going     PT LONG TERM GOAL #3   Title Patient to improve L shoulder strength to >/= 4+5 with no increase in pain (10/15/16)   Status On-going     PT LONG TERM GOAL #4   Title Patient to report return to normal ADLs and work related task with pain no greater than 1/10 for greater than 2 weeks demonstrating improved functional use of L shoulder (10/15/16)   Status On-going               Plan - 09/17/16 1543    Clinical Impression Statement Melinda Kirk today doing well - however continues to report ovall tightness and pain with shoulder movements. Patient recently seen Dr. Darene Lamer, who provided an injection and recommended continued PT to progress functional use of shoulder. Patient tolerating all stretching, however is limited due to pain.    PT Treatment/Interventions ADLs/Self Care Home Management;Cryotherapy;Electrical Stimulation;Iontophoresis 4mg /ml Dexamethasone;Moist Heat;Ultrasound;Therapeutic exercise;Therapeutic activities;Patient/family education;Manual techniques;Passive range of motion;Vasopneumatic Device;Taping;Dry needling   PT Next Visit Plan gentle ROM per patient tolerance   Consulted and Agree with Plan of Care Patient      Patient will benefit from skilled therapeutic intervention in order to improve the following deficits and impairments:  Decreased activity tolerance, Decreased range of motion, Decreased strength, Impaired UE functional use, Pain  Visit Diagnosis: Acute pain of right shoulder  Stiffness of right shoulder, not elsewhere classified  Abnormal posture  Muscle weakness (generalized)     Problem List Patient Active Problem List   Diagnosis Date Noted  . Pain of left patellofemoral joint 09/12/2016  . Adhesive capsulitis of left shoulder 06/30/2016  . Palpitations 04/02/2016  . Anemia 07/08/2015  . Cervical cancer screening 07/04/2015  . Dysphagia 04/05/2015  . Low back pain 11/21/2014  . Muscle spasm of  back 04/11/2014  . Cryptogenic stroke ([place]) 01/27/2014  . PFO (patent foramen ovale) 01/23/2014  . Menorrhagia 03/07/2013  . Hyperlipidemia, mixed 02/08/2013  . GERD (gastroesophageal reflux disease) 02/08/2013  . Migraine 01/03/2013  . Conjunctival hemorrhage of left eye 09/23/2011  . Vitamin D deficiency 03/08/2011  . 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 Thyroid nodule 01/15/2011  . Nevus 01/15/2011  . Breast calcification, left 01/15/2011     Lanney Gins, PT, DPT 09/17/16 3:49 PM   [facility] [address]  [address] [place], [place], [address] Phone: [phone]  Fax:  [phone]  Name: Melinda Kirk MRN: [id number] Date of Birth: 1970-09-15

## 2016-09-19 ENCOUNTER — Ambulatory Visit: Payer: 59 | Admitting: Physical Therapy

## 2016-09-19 DIAGNOSIS — R293 Abnormal posture: Secondary | ICD-10-CM

## 2016-09-19 DIAGNOSIS — M6281 Muscle weakness (generalized): Secondary | ICD-10-CM

## 2016-09-19 DIAGNOSIS — M25611 Stiffness of right shoulder, not elsewhere classified: Secondary | ICD-10-CM

## 2016-09-19 DIAGNOSIS — M25511 Pain in right shoulder: Secondary | ICD-10-CM

## 2016-09-19 NOTE — Therapy (Signed)
[facility] [address]  [place], [place], [address] Phone: [phone]   Fax:  [phone]  Physical Therapy Treatment  Patient Details  Name: Melinda Kirk MRN: [id number] Date of Birth: 1971-04-03 Referring Provider: Dr. Karlton Lemon  Encounter Date: 09/19/2016      PT End of Session - 09/19/16 1433    Visit Number 9   Number of Visits 16   Date for PT Re-Evaluation 10/15/16   PT Start Time 0930   PT Stop Time 1029   PT Time Calculation (min) 59 min   Activity Tolerance Patient tolerated treatment well;Patient limited by pain   Behavior During Therapy [facility] for tasks assessed/performed      Past Medical History:  Diagnosis Date  . Anemia 07/08/2015  . Breast calcification, left 07/29/04  . 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 s/p PFO closure on 07/03/16  . GERD (gastroesophageal reflux disease)   . Menorrhagia 03/07/2013  . PFO (patent foramen ovale)    a. s/p PFO closure 07/03/16  . Thyroid nodule    s/p FNA- 4/09- benign  . Unspecified hypothyroidism 01/15/2011  . Vitamin D deficiency 03/08/2011    Past Surgical History:  Procedure Laterality Date  . BIOPSY THYROID  10/2007   benign biopsy of thyroid nodules.  [name] PATENT FORAMEN OVALE CLOSURE  07/03/2016  . PATENT FORAMEN OVALE CLOSURE N/A 07/03/2016   Procedure: PFO  Closure;  Surgeon: [name], MD;  Location: [place] [facility];  Service: Cardiovascular;  Laterality: N/A;  . TEE WITHOUT CARDIOVERSION N/A 02/10/2014   Procedure: TRANSESOPHAGEAL ECHOCARDIOGRAM (TEE);  Surgeon: [name], MD;  Location: [facility] ENDOSCOPY;  Service: Cardiovascular;  Laterality: N/A;    There were no vitals filed for this visit.      Subjective Assessment - 09/19/16 1428    Subjective feels like sleeping is better - some reduced pain with movement   Patient Stated Goals reduce pain, improve function   Currently in Pain? --  no pain seated; pain increase to 6-7/10  with movement/stretching                         [facility] Adult PT Treatment/Exercise - 09/19/16 0001      Shoulder Exercises: Standing   Flexion AAROM;Left;15 reps   Flexion Limitations wall wash   Other Standing Exercises counter walkouts 10 x 10 second hold     Shoulder Exercises: Pulleys   Flexion 3 minutes   Flexion Limitations VC for gentle stretch   ABduction 3 minutes   ABduction Limitations VC for gentle stretch     Modalities   Modalities Moist Heat     Moist Heat Therapy   Number Minutes Moist Heat 15 Minutes   Moist Heat Location Shoulder     Electrical Stimulation   Electrical Stimulation Location L shoulder   Electrical Stimulation Action IFC   Electrical Stimulation Parameters to tolerance   Electrical Stimulation Goals Pain     Manual Therapy   Manual Therapy Joint mobilization;Passive ROM   Joint Mobilization Grade III-IV joint mobs - AP/PA/inferior - patient very tolerable - stated "feels good"   Passive ROM gentle PROM into flexion, abduction, ER and IR for approx 10 x 15" holds - minimal overpressure to prevent pain                  PT Short Term Goals - 09/05/16 1149  PT SHORT TERM GOAL #1   Title patient to improve L shoulder PROM equal to that of R shoulder with no increase in pain (09/17/16)   Status On-going           PT Long Term Goals - 09/05/16 1149      PT LONG TERM GOAL #1   Title patient to be independent with advanced HEP (10/15/16)   Status On-going     PT LONG TERM GOAL #2   Title patient to improve L shoulder AROM equal to that of R shouler with no increase in pain (10/15/16)   Status On-going     PT LONG TERM GOAL #3   Title Patient to improve L shoulder strength to >/= 4+5 with no increase in pain (10/15/16)   Status On-going     PT LONG TERM GOAL #4   Title Patient to report return to normal ADLs and work related task with pain no greater than 1/10 for greater than 2 weeks demonstrating  improved functional use of L shoulder (10/15/16)   Status On-going               Plan - 09/19/16 1443    Clinical Impression Statement Subjective reports today of reduced pain with sleeping as well as overall feeling better. Patient continues to minimally tolerate stretching into end range, however, does report more of a stretching sensation with PROM rather than just pain. Patient instructed in continued stretching and AAROM within tolerable ranges with avoidance of high pain areas. Patient to continue to benefit from PT to maximize functional use of L UE.    PT Treatment/Interventions ADLs/Self Care Home Management;Cryotherapy;Electrical Stimulation;Iontophoresis 4mg /ml Dexamethasone;Moist Heat;Ultrasound;Therapeutic exercise;Therapeutic activities;Patient/family education;Manual techniques;Passive range of motion;Vasopneumatic Device;Taping;Dry needling   PT Next Visit Plan gentle ROM per patient tolerance   Consulted and Agree with Plan of Care Patient      Patient will benefit from skilled therapeutic intervention in order to improve the following deficits and impairments:  Decreased activity tolerance, Decreased range of motion, Decreased strength, Impaired UE functional use, Pain  Visit Diagnosis: Acute pain of right shoulder  Stiffness of right shoulder, not elsewhere classified  Abnormal posture  Muscle weakness (generalized)     Problem List Patient Active Problem List   Diagnosis Date Noted  . Pain of left patellofemoral joint 09/12/2016  . Adhesive capsulitis of left shoulder 06/30/2016  . Palpitations 04/02/2016  . Anemia 07/08/2015  . Cervical cancer screening 07/04/2015  . Dysphagia 04/05/2015  . Low back pain 11/21/2014  . Muscle spasm of back 04/11/2014  . Cryptogenic stroke ([place]) 01/27/2014  . PFO (patent foramen ovale) 01/23/2014  . Menorrhagia 03/07/2013  . Hyperlipidemia, mixed 02/08/2013  . GERD (gastroesophageal reflux disease) 02/08/2013  .  Migraine 01/03/2013  . Conjunctival hemorrhage of left eye 09/23/2011  . Vitamin D deficiency 03/08/2011  . 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 Thyroid nodule 01/15/2011  . Nevus 01/15/2011  . Breast calcification, left 01/15/2011     Lanney Gins, PT, DPT 09/19/16 2:54 PM   [facility] [address]  [place], [place], [address] Phone: [phone]   Fax:  [phone]  Name: Melinda Kirk MRN: [id number] Date of Birth: 08/08/1970

## 2016-09-24 ENCOUNTER — Telehealth: Payer: Self-pay

## 2016-09-24 ENCOUNTER — Other Ambulatory Visit: Payer: Self-pay | Admitting: Family Medicine

## 2016-09-24 ENCOUNTER — Ambulatory Visit: Payer: 59 | Admitting: Physical Therapy

## 2016-09-24 DIAGNOSIS — M6281 Muscle weakness (generalized): Secondary | ICD-10-CM | POA: Diagnosis not present

## 2016-09-24 DIAGNOSIS — R293 Abnormal posture: Secondary | ICD-10-CM

## 2016-09-24 DIAGNOSIS — M25611 Stiffness of right shoulder, not elsewhere classified: Secondary | ICD-10-CM | POA: Diagnosis not present

## 2016-09-24 DIAGNOSIS — M25511 Pain in right shoulder: Secondary | ICD-10-CM

## 2016-09-24 MED FILL — CLOPIDOGREL 75 MG TABLET: 75 | 90 days supply | Qty: 90 | Fill #1

## 2016-09-24 MED FILL — raNITIdine HCL 300 MG TABS: 300 | 30 days supply | Qty: 30 | Fill #0

## 2016-09-24 NOTE — Telephone Encounter (Signed)
Pt left VM asking if she should have any restrictions. Please advise.

## 2016-09-24 NOTE — Telephone Encounter (Signed)
Depends on how her symptoms are doing, she any better?

## 2016-09-24 NOTE — Therapy (Signed)
[facility] [address]  [place], [place], [address] Phone: [phone]   Fax:  [phone]  Physical Therapy Treatment  Patient Details  Name: Melinda Kirk MRN: [id number] Date of Birth: [birth year] Referring Provider: Dr. Karlton Lemon  Encounter Date: 09/24/2016      PT End of Session - 09/24/16 1405    Visit Number 10   Number of Visits 16   Date for PT Re-Evaluation 10/15/16   PT Start Time 1401   PT Stop Time 1500   PT Time Calculation (min) 59 min   Activity Tolerance Patient tolerated treatment well;Patient limited by pain   Behavior During Therapy [facility] for tasks assessed/performed      Past Medical History:  Diagnosis Date  . Anemia 07/08/2015  . Breast calcification, left 07/29/04  . 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 s/p PFO closure on 07/03/16  . GERD (gastroesophageal reflux disease)   . Menorrhagia 03/07/2013  . PFO (patent foramen ovale)    a. s/p PFO closure 07/03/16  . Thyroid nodule    s/p FNA- 4/09- benign  . Unspecified hypothyroidism 01/15/2011  . Vitamin D deficiency 03/08/2011    Past Surgical History:  Procedure Laterality Date  . BIOPSY THYROID  10/2007   benign biopsy of thyroid nodules.  [name] PATENT FORAMEN OVALE CLOSURE  07/03/2016  . PATENT FORAMEN OVALE CLOSURE N/A 07/03/2016   Procedure: PFO  Closure;  Surgeon: [name], MD;  Location: [facility] [facility];  Service: Cardiovascular;  Laterality: N/A;  . TEE WITHOUT CARDIOVERSION N/A 02/10/2014   Procedure: TRANSESOPHAGEAL ECHOCARDIOGRAM (TEE);  Surgeon: [name], MD;  Location: [facility] ENDOSCOPY;  Service: Cardiovascular;  Laterality: N/A;    There were no vitals filed for this visit.      Subjective Assessment - 09/24/16 1404    Subjective feels like pain is improving - only has some sharp pain with sudden movements   Patient Stated Goals reduce pain, improve function   Currently in Pain? No/denies   Pain Score 0-No  pain                         [facility] Adult PT Treatment/Exercise - 09/24/16 0001      Shoulder Exercises: Standing   Flexion AAROM;Left;15 reps   Flexion Limitations wall wash   ABduction AAROM;Left;15 reps   ABduction Limitations wall ladder   Row Right;Both;12 reps;Theraband   Theraband Level (Shoulder Row) Level 3 (Green)     Shoulder Exercises: Pulleys   Flexion 3 minutes   Flexion Limitations VC for gentle stretch   ABduction 3 minutes   ABduction Limitations VC for gentle stretch     Modalities   Modalities Electrical Stimulation;Vasopneumatic;Iontophoresis     Moist Heat Therapy   Number Minutes Moist Heat --   Moist Heat Location --     Electrical Stimulation   Electrical Stimulation Location L shoulder   Electrical Stimulation Action IFC    Electrical Stimulation Parameters to tolerance   Electrical Stimulation Goals Pain     Iontophoresis   Type of Iontophoresis Dexamethasone   Location L shoulder   Dose 1.0 mL - 80 mA - 4-6 hours     Vasopneumatic   Number Minutes Vasopneumatic  15 minutes   Vasopnuematic Location  Shoulder   Vasopneumatic Pressure Medium   Vasopneumatic Temperature  coldest temp.     Manual Therapy   Manual Therapy Joint mobilization;Passive ROM  Joint Mobilization Grade III-IV joint mobs - AP/PA/inferior - patient very tolerable   Passive ROM gentle PROM into flexion, abduction, ER and IR for approx 10 x 15" holds - minimal overpressure to prevent pain                PT Education - 09/24/16 1809    Education provided Yes   Education Details ionto precautions   Person(s) Educated Patient   Methods Explanation;Handout   Comprehension Verbalized understanding;Returned demonstration          PT Short Term Goals - 09/05/16 1149      PT SHORT TERM GOAL #1   Title patient to improve L shoulder PROM equal to that of R shoulder with no increase in pain (09/17/16)   Status On-going           PT Long  Term Goals - 09/05/16 1149      PT LONG TERM GOAL #1   Title patient to be independent with advanced HEP (10/15/16)   Status On-going     PT LONG TERM GOAL #2   Title patient to improve L shoulder AROM equal to that of R shouler with no increase in pain (10/15/16)   Status On-going     PT LONG TERM GOAL #3   Title Patient to improve L shoulder strength to >/= 4+5 with no increase in pain (10/15/16)   Status On-going     PT LONG TERM GOAL #4   Title Patient to report return to normal ADLs and work related task with pain no greater than 1/10 for greater than 2 weeks demonstrating improved functional use of L shoulder (10/15/16)   Status On-going               Plan - 09/24/16 1811    Clinical Impression Statement Talaijah doing well today - reports of improved pain symptoms with patient stating she feels like she is slowly improving. Patient today with improved PROM since initial visit, however continues to be pain at end ranges. Patient progressing AAROM and some scapular strengthening today with pain limiting continued movement. Patient to benefit form continued PT to maximize functional use of L UE.    PT Treatment/Interventions ADLs/Self Care Home Management;Cryotherapy;Electrical Stimulation;Iontophoresis 4mg /ml Dexamethasone;Moist Heat;Ultrasound;Therapeutic exercise;Therapeutic activities;Patient/family education;Manual techniques;Passive range of motion;Vasopneumatic Device;Taping;Dry needling   PT Next Visit Plan gentle ROM per patient tolerance; assess response to ionto   Consulted and Agree with Plan of Care Patient      Patient will benefit from skilled therapeutic intervention in order to improve the following deficits and impairments:  Decreased activity tolerance, Decreased range of motion, Decreased strength, Impaired UE functional use, Pain  Visit Diagnosis: Acute pain of right shoulder  Stiffness of right shoulder, not elsewhere classified  Abnormal posture  Muscle  weakness (generalized)     Problem List Patient Active Problem List   Diagnosis Date Noted  . Pain of left patellofemoral joint 09/12/2016  . Adhesive capsulitis of left shoulder 06/30/2016  . Palpitations 04/02/2016  . Anemia 07/08/2015  . Cervical cancer screening 07/04/2015  . Dysphagia 04/05/2015  . Low back pain 11/21/2014  . Muscle spasm of back 04/11/2014  . Cryptogenic stroke ([place]) 01/27/2014  . PFO (patent foramen ovale) 01/23/2014  . Menorrhagia 03/07/2013  . Hyperlipidemia, mixed 02/08/2013  . GERD (gastroesophageal reflux disease) 02/08/2013  . Migraine 01/03/2013  . Conjunctival hemorrhage of left eye 09/23/2011  . Vitamin D deficiency 03/08/2011  . 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  Thyroid nodule 01/15/2011  . Nevus 01/15/2011  . Breast calcification, left 01/15/2011    Lanney Gins, PT, DPT 09/24/16 6:19 PM   [facility] [address]  [place], [place], [address] Phone: [phone]   Fax:  [phone]  Name: LUGARDA IGNACIO MRN: [id number] Date of Birth: 15-Aug-1970

## 2016-09-25 NOTE — Telephone Encounter (Signed)
Pt states she is having some improvement. Pt states that Dr. Barbaraann Barthel had her on restrictions for no lifting over 15lbs. However, the restrictions ran out yesterday. Pt states she is still having pain when lifting her arm. Pt stated her employment needs to know if she is suppose to be on restrictions or not? Please advise.

## 2016-09-25 NOTE — Telephone Encounter (Signed)
I would leave the same restrictions in place for another month.  If she got a partial response but is still improving I would recommend we stay the course.  If she has plateaued then I would do a second injection into the joint.  Either way feel free to write a letter for her continued restrictions.

## 2016-09-26 ENCOUNTER — Ambulatory Visit: Payer: 59 | Attending: Sports Medicine | Admitting: Physical Therapy

## 2016-09-26 DIAGNOSIS — M25611 Stiffness of right shoulder, not elsewhere classified: Secondary | ICD-10-CM | POA: Diagnosis present

## 2016-09-26 DIAGNOSIS — R293 Abnormal posture: Secondary | ICD-10-CM

## 2016-09-26 DIAGNOSIS — M6281 Muscle weakness (generalized): Secondary | ICD-10-CM | POA: Diagnosis present

## 2016-09-26 DIAGNOSIS — M25511 Pain in right shoulder: Secondary | ICD-10-CM

## 2016-09-26 NOTE — Telephone Encounter (Signed)
Pt informed. Pt stated she is coming to pick up her letter tomorrow. Pt informed that the letter is ready for pick-up at front desk.

## 2016-09-26 NOTE — Therapy (Addendum)
[facility] [address]  [place], [place], [address] Phone: [phone]   Fax:  [phone]  Physical Therapy Treatment  Patient Details  Name: Melinda Kirk MRN: [id number] Date of Birth: [birth year] Referring Provider: Dr. Karlton Lemon  Encounter Date: 09/26/2016      PT End of Session - 09/26/16 1535    Visit Number 11   Number of Visits 16   Date for PT Re-Evaluation 10/15/16   PT Start Time [id number]  pt arrived late, having just gotten off shift at 3:00   PT Stop Time 1620   PT Time Calculation (min) 45 min   Activity Tolerance Patient tolerated treatment well;Patient limited by pain   Behavior During Therapy [facility] for tasks assessed/performed      Past Medical History:  Diagnosis Date  . Anemia 07/08/2015  . Breast calcification, left 07/29/04  . 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 s/p PFO closure on 07/03/16  . GERD (gastroesophageal reflux disease)   . Menorrhagia 03/07/2013  . PFO (patent foramen ovale)    a. s/p PFO closure 07/03/16  . Thyroid nodule    s/p FNA- 4/09- benign  . Unspecified hypothyroidism 01/15/2011  . Vitamin D deficiency 03/08/2011    Past Surgical History:  Procedure Laterality Date  . BIOPSY THYROID  10/2007   benign biopsy of thyroid nodules.  [name] PATENT FORAMEN OVALE CLOSURE  07/03/2016  . PATENT FORAMEN OVALE CLOSURE N/A 07/03/2016   Procedure: PFO  Closure;  Surgeon: [name], MD;  Location: [place] CV LAB;  Service: Cardiovascular;  Laterality: N/A;  . TEE WITHOUT CARDIOVERSION N/A 02/10/2014   Procedure: TRANSESOPHAGEAL ECHOCARDIOGRAM (TEE);  Surgeon: [name], MD;  Location: [facility] ENDOSCOPY;  Service: Cardiovascular;  Laterality: N/A;    There were no vitals filed for this visit.      Subjective Assessment - 09/26/16 1538    Subjective Pt noting benefit from ionto patch. States pain has lessened by 50%, with no pain at rest, but still present with attempts at  overhead movement.   Patient Stated Goals reduce pain, improve function   Currently in Pain? No/denies                         [facility] Adult PT Treatment/Exercise - 09/26/16 0001      Shoulder Exercises: Standing   Row Right;Both;12 reps;Theraband   Theraband Level (Shoulder Row) Level 3 (Green)   Retraction Both;10 reps;Theraband   Theraband Level (Shoulder Retraction) Level 2 (Red)   Retraction Limitations + shoulder extension to just shy of neutral     Shoulder Exercises: Pulleys   Flexion 3 minutes   Flexion Limitations VC for gentle stretch   ABduction 3 minutes   ABduction Limitations VC for gentle stretch     Shoulder Exercises: ROM/Strengthening   Other ROM/Strengthening Exercises BATCA pull-down (emphasis on scap retraction with passive release for flexion AAROM) 10x5"     Shoulder Exercises: Stretch   Other Shoulder Stretches B UT & LS stretches with hand anchored on edge of seat 2x30"     Manual Therapy   Manual Therapy Joint mobilization;Passive ROM;Soft tissue mobilization   Joint Mobilization Grade III-IV joint mobs - AP/PA/inferior - patient very tolerable   Soft tissue mobilization posterior capsule - subscapularis, teres major/minor   Passive ROM gentle PROM with MWM into flexion, abduction, ER and IR for approx 10 x 15" holds - minimal overpressure to prevent  pain                PT Education - 09/26/16 1620    Education Details UT & LS stretches   Person(s) Educated Patient   Methods Explanation;Demonstration   Comprehension Verbalized understanding;Returned demonstration          PT Short Term Goals - 09/26/16 1543      PT SHORT TERM GOAL #1   Title patient to improve L shoulder PROM equal to that of R shoulder with no increase in pain (09/17/16)   Status Deferred           PT Long Term Goals - 09/05/16 1149      PT LONG TERM GOAL #1   Title patient to be independent with advanced HEP (10/15/16)   Status On-going      PT LONG TERM GOAL #2   Title patient to improve L shoulder AROM equal to that of R shouler with no increase in pain (10/15/16)   Status On-going     PT LONG TERM GOAL #3   Title Patient to improve L shoulder strength to >/= 4+5 with no increase in pain (10/15/16)   Status On-going     PT LONG TERM GOAL #4   Title Patient to report return to normal ADLs and work related task with pain no greater than 1/10 for greater than 2 weeks demonstrating improved functional use of L shoulder (10/15/16)   Status On-going               Plan - 09/26/16 1544    Clinical Impression Statement Pt noting benefit from ionto patch last visit with reports of overall pain improving, although does note increased pain/tension across top of shoulders and back of neck. Benefit noted with improved tolerance for ROM after STM to posterior capsule with pt reporting some discomfort but "good stretch". PROM also better tolerated with MWM. Vasopnuematic compression deferred d/t equip malfunction, with pt declining ice pack.   PT Treatment/Interventions ADLs/Self Care Home Management;Cryotherapy;Electrical Stimulation;Iontophoresis 4mg /ml Dexamethasone;Moist Heat;Ultrasound;Therapeutic exercise;Therapeutic activities;Patient/family education;Manual techniques;Passive range of motion;Vasopneumatic Device;Taping;Dry needling   PT Next Visit Plan gentle ROM per patient tolerance; ionto patch as indicated and continued benefit noted   Consulted and Agree with Plan of Care Patient      Patient will benefit from skilled therapeutic intervention in order to improve the following deficits and impairments:  Decreased activity tolerance, Decreased range of motion, Decreased strength, Impaired UE functional use, Pain  Visit Diagnosis: Acute pain of right shoulder  Stiffness of right shoulder, not elsewhere classified  Abnormal posture  Muscle weakness (generalized)     Problem List Patient Active Problem List    Diagnosis Date Noted  . Pain of left patellofemoral joint 09/12/2016  . Adhesive capsulitis of left shoulder 06/30/2016  . Palpitations 04/02/2016  . Anemia 07/08/2015  . Cervical cancer screening 07/04/2015  . Dysphagia 04/05/2015  . Low back pain 11/21/2014  . Muscle spasm of back 04/11/2014  . Cryptogenic stroke ([place]) 01/27/2014  . PFO (patent foramen ovale) 01/23/2014  . Menorrhagia 03/07/2013  . Hyperlipidemia, mixed 02/08/2013  . GERD (gastroesophageal reflux disease) 02/08/2013  . Migraine 01/03/2013  . Conjunctival hemorrhage of left eye 09/23/2011  . Vitamin D deficiency 03/08/2011  . 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 Thyroid nodule 01/15/2011  . Nevus 01/15/2011  . Breast calcification, left 01/15/2011    Percival Spanish, PT, MPT 09/26/2016, 6:46 PM  [facility]  [address]  [place], [place], [address] Phone: [phone]   Fax:  [phone]  Name: Melinda Kirk MRN: [id number] Date of Birth: Jan 30, 1971

## 2016-10-01 ENCOUNTER — Ambulatory Visit: Payer: 59 | Admitting: Physical Therapy

## 2016-10-01 DIAGNOSIS — M6281 Muscle weakness (generalized): Secondary | ICD-10-CM | POA: Diagnosis not present

## 2016-10-01 DIAGNOSIS — M25511 Pain in right shoulder: Secondary | ICD-10-CM | POA: Diagnosis not present

## 2016-10-01 DIAGNOSIS — M25611 Stiffness of right shoulder, not elsewhere classified: Secondary | ICD-10-CM | POA: Diagnosis not present

## 2016-10-01 DIAGNOSIS — R293 Abnormal posture: Secondary | ICD-10-CM | POA: Diagnosis not present

## 2016-10-01 NOTE — Therapy (Signed)
[facility] [address]  [place], [place], [address] Phone: [phone]   Fax:  [phone]  Physical Therapy Treatment  Patient Details  Name: Melinda Kirk MRN: [id number] Date of Birth: 20-Dec-1970 Referring Provider: Dr. Karlton Lemon  Encounter Date: 10/01/2016      PT End of Session - 10/01/16 1538    Visit Number 12   Number of Visits 16   Date for PT Re-Evaluation 10/15/16   PT Start Time [id number]   PT Stop Time 1630   PT Time Calculation (min) 57 min   Activity Tolerance Patient tolerated treatment well;Patient limited by pain   Behavior During Therapy [facility] for tasks assessed/performed      Past Medical History:  Diagnosis Date  . Anemia 07/08/2015  . Breast calcification, left 07/29/04  . 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 s/p PFO closure on 07/03/16  . GERD (gastroesophageal reflux disease)   . Menorrhagia 03/07/2013  . PFO (patent foramen ovale)    a. s/p PFO closure 07/03/16  . Thyroid nodule    s/p FNA- 4/09- benign  . Unspecified hypothyroidism 01/15/2011  . Vitamin D deficiency 03/08/2011    Past Surgical History:  Procedure Laterality Date  . BIOPSY THYROID  10/2007   benign biopsy of thyroid nodules.  [name] PATENT FORAMEN OVALE CLOSURE  07/03/2016  . PATENT FORAMEN OVALE CLOSURE N/A 07/03/2016   Procedure: PFO  Closure;  Surgeon: [name], MD;  Location: [place] CV LAB;  Service: Cardiovascular;  Laterality: N/A;  . TEE WITHOUT CARDIOVERSION N/A 02/10/2014   Procedure: TRANSESOPHAGEAL ECHOCARDIOGRAM (TEE);  Surgeon: [name], MD;  Location: [facility] ENDOSCOPY;  Service: Cardiovascular;  Laterality: N/A;    There were no vitals filed for this visit.      Subjective Assessment - 10/01/16 1537    Subjective Patient reporting Dr. Darene Lamer wants to do another shot - biggest complaint today is neck pain   Patient Stated Goals reduce pain, improve function   Currently in Pain? Yes   Pain Score 2     Pain Location Neck   Pain Orientation Right;Left;Posterior   Pain Descriptors / Indicators Aching;Tightness                         [facility] Adult PT Treatment/Exercise - 10/01/16 1540      Shoulder Exercises: Pulleys   Flexion 3 minutes   ABduction 3 minutes     Modalities   Modalities Electrical Stimulation;Moist Heat     Moist Heat Therapy   Number Minutes Moist Heat 15 Minutes   Moist Heat Location Cervical     Electrical Stimulation   Electrical Stimulation Location B UT to B cervical paraspinals   Electrical Stimulation Action IFC   Electrical Stimulation Parameters to tolerance   Electrical Stimulation Goals Pain     Manual Therapy   Manual Therapy Joint mobilization;Soft tissue mobilization;Myofascial release;Passive ROM   Joint Mobilization Grade I-II CPA of cervical spince with patient supine - good joint mobility; Grade III-IV L GH joint mobs (inferior and posterior) for improved motion with reduced capsular tightness; intermittent long axis distraction to L GH joint   Soft tissue mobilization STM to B UT, B levator scap, B cervicla paraspinals, B suboccipital mm   Myofascial Release trigger point release to B UT and B levator scap   Passive ROM gentle PROM into L shoulder flexion, abduction, Er and IR with slight overpressure to  facilitate stretch for improved motion; B UT stretch 1 x 1 minute each; B levator scap stretch 1 x 1 minute each - PT led                  PT Short Term Goals - 09/26/16 1543      PT SHORT TERM GOAL #1   Title patient to improve L shoulder PROM equal to that of R shoulder with no increase in pain (09/17/16)   Status Deferred           PT Long Term Goals - 09/05/16 1149      PT LONG TERM GOAL #1   Title patient to be independent with advanced HEP (10/15/16)   Status On-going     PT LONG TERM GOAL #2   Title patient to improve L shoulder AROM equal to that of R shouler with no increase in pain (10/15/16)    Status On-going     PT LONG TERM GOAL #3   Title Patient to improve L shoulder strength to >/= 4+5 with no increase in pain (10/15/16)   Status On-going     PT LONG TERM GOAL #4   Title Patient to report return to normal ADLs and work related task with pain no greater than 1/10 for greater than 2 weeks demonstrating improved functional use of L shoulder (10/15/16)   Status On-going               Plan - 10/01/16 1540    Clinical Impression Statement PT session today focusing heavily on manual therapy as patients biggest concern today is neck and upper back pain limiting functional activities and sleeping. Discussion with patoinet on proper sleeping techniques to avoid excessive cervical extension as this places her in a shortend postion likely leading to some of her discomfort during the day. Patient has visiblyimproved L shoulder flexion ROM, however, will continue to benefit from PT to maximize functional use of L UE and to allow for improved ability ot perorm work and home related tasks.    PT Treatment/Interventions ADLs/Self Care Home Management;Cryotherapy;Electrical Stimulation;Iontophoresis 4mg /ml Dexamethasone;Moist Heat;Ultrasound;Therapeutic exercise;Therapeutic activities;Patient/family education;Manual techniques;Passive range of motion;Vasopneumatic Device;Taping;Dry needling   PT Next Visit Plan gentle ROM per patient tolerance; ionto patch as indicated and continued benefit noted   Consulted and Agree with Plan of Care Patient      Patient will benefit from skilled therapeutic intervention in order to improve the following deficits and impairments:  Decreased activity tolerance, Decreased range of motion, Decreased strength, Impaired UE functional use, Pain  Visit Diagnosis: Acute pain of right shoulder  Stiffness of right shoulder, not elsewhere classified  Abnormal posture  Muscle weakness (generalized)     Problem List Patient Active Problem List   Diagnosis  Date Noted  . Pain of left patellofemoral joint 09/12/2016  . Adhesive capsulitis of left shoulder 06/30/2016  . Palpitations 04/02/2016  . Anemia 07/08/2015  . Cervical cancer screening 07/04/2015  . Dysphagia 04/05/2015  . Low back pain 11/21/2014  . Muscle spasm of back 04/11/2014  . Cryptogenic stroke ([place]) 01/27/2014  . PFO (patent foramen ovale) 01/23/2014  . Menorrhagia 03/07/2013  . Hyperlipidemia, mixed 02/08/2013  . GERD (gastroesophageal reflux disease) 02/08/2013  . Migraine 01/03/2013  . Conjunctival hemorrhage of left eye 09/23/2011  . Vitamin D deficiency 03/08/2011  . 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 Thyroid nodule 01/15/2011  . Nevus 01/15/2011  . Breast calcification, left 01/15/2011     Ailene Rud  Marjory Lies, PT, DPT 10/01/16 6:11 PM   [facility] [address]  [place], [place], [address] Phone: [phone]   Fax:  [phone]  Name: Melinda Kirk MRN: [id number] Date of Birth: 1971/01/27

## 2016-10-04 ENCOUNTER — Ambulatory Visit: Payer: 59 | Admitting: Physical Therapy

## 2016-10-04 DIAGNOSIS — M25611 Stiffness of right shoulder, not elsewhere classified: Secondary | ICD-10-CM

## 2016-10-04 DIAGNOSIS — M6281 Muscle weakness (generalized): Secondary | ICD-10-CM | POA: Diagnosis not present

## 2016-10-04 DIAGNOSIS — R293 Abnormal posture: Secondary | ICD-10-CM

## 2016-10-04 DIAGNOSIS — M25511 Pain in right shoulder: Secondary | ICD-10-CM

## 2016-10-04 NOTE — Therapy (Signed)
[facility] [address]  [place], [place], [address] Phone: [phone]   Fax:  [phone]  Physical Therapy Treatment  Patient Details  Name: Melinda Kirk MRN: [id number] Date of Birth: 07/27/1971 Referring Provider: Dr. Karlton Lemon  Encounter Date: 10/04/2016      PT End of Session - 10/04/16 0847    Visit Number 13   Number of Visits 16   Date for PT Re-Evaluation 10/15/16   PT Start Time 0847   PT Stop Time 0931   PT Time Calculation (min) 44 min   Activity Tolerance Patient tolerated treatment well   Behavior During Therapy [facility] for tasks assessed/performed      Past Medical History:  Diagnosis Date  . Anemia 07/08/2015  . Breast calcification, left 07/29/04  . 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 s/p PFO closure on 07/03/16  . GERD (gastroesophageal reflux disease)   . Menorrhagia 03/07/2013  . PFO (patent foramen ovale)    a. s/p PFO closure 07/03/16  . Thyroid nodule    s/p FNA- 4/09- benign  . Unspecified hypothyroidism 01/15/2011  . Vitamin D deficiency 03/08/2011    Past Surgical History:  Procedure Laterality Date  . BIOPSY THYROID  10/2007   benign biopsy of thyroid nodules.  [name] PATENT FORAMEN OVALE CLOSURE  07/03/2016  . PATENT FORAMEN OVALE CLOSURE N/A 07/03/2016   Procedure: PFO  Closure;  Surgeon: [name], MD;  Location: [place] CV LAB;  Service: Cardiovascular;  Laterality: N/A;  . TEE WITHOUT CARDIOVERSION N/A 02/10/2014   Procedure: TRANSESOPHAGEAL ECHOCARDIOGRAM (TEE);  Surgeon: [name], MD;  Location: [facility] ENDOSCOPY;  Service: Cardiovascular;  Laterality: N/A;    There were no vitals filed for this visit.      Subjective Assessment - 10/04/16 0847    Subjective Pt reporting pain lessening - now able to do more activities & ADLs w/o pain. Neck remains more limiting factor.   Patient Stated Goals reduce pain, improve function   Currently in Pain? No/denies                          [facility] Adult PT Treatment/Exercise - 10/04/16 0847      Self-Care   Self-Care Posture   Posture Educated pt on postural awareness with cues for neutral spine in sitting & standing. Discussed proper sleeping positions with appropriate use of pillows to maintain neutral spine. Pt inquiring about use thoracic brace to promote neutral shoulder posture, and states plan to ask MD for recommendations.     Shoulder Exercises: Supine   Horizontal ABduction Both;10 reps;Theraband   Theraband Level (Shoulder Horizontal ABduction) Level 1 (Yellow)   Horizontal ABduction Limitations hooklying on pool noodle - emphasis on scap retraction   External Rotation Both;10 reps;Theraband   Theraband Level (Shoulder External Rotation) Level 1 (Yellow)   External Rotation Limitations hooklying on pool noodle - emphasis on scap retraction     Shoulder Exercises: Pulleys   Flexion 3 minutes   ABduction 3 minutes     Shoulder Exercises: Stretch   Other Shoulder Stretches B pec chest stretch hooklying on pool noodle x     Manual Therapy   Manual Therapy Soft tissue mobilization;Myofascial release;Passive ROM;Taping   Joint Mobilization --   Soft tissue mobilization STM to B UT, B levator scap, B cervical paraspinals, B suboccipital, B pecs mm   Myofascial Release trigger point release to B UT and  B levator scap   Passive ROM gentle PROM cervical spine in all directions with STM/stretch at end range + B UT stretch 1 x 1 minute each; B levator scap stretch 1 x 1 minute each   Kinesiotex Inhibit Muscle     Kinesiotix   Inhibit Muscle  B UT & LS - 30% stretch with tape applied with muscles on stretch                  PT Short Term Goals - 09/26/16 1543      PT SHORT TERM GOAL #1   Title patient to improve L shoulder PROM equal to that of R shoulder with no increase in pain (09/17/16)   Status Deferred           PT Long Term Goals - 09/05/16 1149      PT  LONG TERM GOAL #1   Title patient to be independent with advanced HEP (10/15/16)   Status On-going     PT LONG TERM GOAL #2   Title patient to improve L shoulder AROM equal to that of R shouler with no increase in pain (10/15/16)   Status On-going     PT LONG TERM GOAL #3   Title Patient to improve L shoulder strength to >/= 4+5 with no increase in pain (10/15/16)   Status On-going     PT LONG TERM GOAL #4   Title Patient to report return to normal ADLs and work related task with pain no greater than 1/10 for greater than 2 weeks demonstrating improved functional use of L shoulder (10/15/16)   Status On-going               Plan - 10/04/16 0853    Clinical Impression Statement Pt continuing to report issues related to neck pain more concerning than shoulder at present, therefore continued emphasis on manual therapy to reduce muscle tension an allow for neutral spine/shoulder posture followed by postional stretching and scapular activation exercises. Treatment concluded with trial of kinesiotape for B UT & LS to promote further muscle relaxation and decreased pain.   PT Treatment/Interventions ADLs/Self Care Home Management;Cryotherapy;Electrical Stimulation;Iontophoresis 4mg /ml Dexamethasone;Moist Heat;Ultrasound;Therapeutic exercise;Therapeutic activities;Patient/family education;Manual techniques;Passive range of motion;Vasopneumatic Device;Taping;Dry needling   PT Next Visit Plan Assess response to tapin; gentle ROM per patient tolerance; ionto patch as indicated and continued benefit noted   Consulted and Agree with Plan of Care Patient      Patient will benefit from skilled therapeutic intervention in order to improve the following deficits and impairments:  Decreased activity tolerance, Decreased range of motion, Decreased strength, Impaired UE functional use, Pain  Visit Diagnosis: Acute pain of right shoulder  Stiffness of right shoulder, not elsewhere classified  Abnormal  posture  Muscle weakness (generalized)     Problem List Patient Active Problem List   Diagnosis Date Noted  . Pain of left patellofemoral joint 09/12/2016  . Adhesive capsulitis of left shoulder 06/30/2016  . Palpitations 04/02/2016  . Anemia 07/08/2015  . Cervical cancer screening 07/04/2015  . Dysphagia 04/05/2015  . Low back pain 11/21/2014  . Muscle spasm of back 04/11/2014  . Cryptogenic stroke ([place]) 01/27/2014  . PFO (patent foramen ovale) 01/23/2014  . Menorrhagia 03/07/2013  . Hyperlipidemia, mixed 02/08/2013  . GERD (gastroesophageal reflux disease) 02/08/2013  . Migraine 01/03/2013  . Conjunctival hemorrhage of left eye 09/23/2011  . Vitamin D deficiency 03/08/2011  . 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 Thyroid nodule 01/15/2011  .  Nevus 01/15/2011  . Breast calcification, left 01/15/2011    Percival Spanish, PT, MPT 10/04/2016, 9:51 AM  [facility] [address]  [place], [place], [address] Phone: [phone]   Fax:  [phone]  Name: Melinda Kirk MRN: [id number] Date of Birth: 1970/08/12

## 2016-10-08 ENCOUNTER — Ambulatory Visit: Payer: 59 | Admitting: Physical Therapy

## 2016-10-08 DIAGNOSIS — M25611 Stiffness of right shoulder, not elsewhere classified: Secondary | ICD-10-CM

## 2016-10-08 DIAGNOSIS — R293 Abnormal posture: Secondary | ICD-10-CM

## 2016-10-08 DIAGNOSIS — M6281 Muscle weakness (generalized): Secondary | ICD-10-CM | POA: Diagnosis not present

## 2016-10-08 DIAGNOSIS — M25511 Pain in right shoulder: Secondary | ICD-10-CM

## 2016-10-08 NOTE — Therapy (Signed)
[facility] [address]  [place], [place], [address] Phone: [phone]   Fax:  [phone]  Physical Therapy Treatment  Patient Details  Name: Melinda Kirk MRN: [id number] Date of Birth: [birth year] Referring Provider: Silverio Decamp, MD (pt switching from Karlton Lemon, MD)  Encounter Date: 10/08/2016      PT End of Session - 10/08/16 1615    Visit Number 14   Number of Visits 30   Date for PT Re-Evaluation 12/03/16   PT Start Time 6962   PT Stop Time 1657   PT Time Calculation (min) 42 min   Activity Tolerance Patient tolerated treatment well   Behavior During Therapy [facility] for tasks assessed/performed      Past Medical History:  Diagnosis Date  . Anemia 07/08/2015  . Breast calcification, left 07/29/04  . Cryptogenic stroke ([place]) 2015   a. s/p PFO closure on 07/03/16  . GERD (gastroesophageal reflux disease)   . Menorrhagia 03/07/2013  . PFO (patent foramen ovale)    a. s/p PFO closure 07/03/16  . Thyroid nodule    s/p FNA- 4/09- benign  . Unspecified hypothyroidism 01/15/2011  . Vitamin D deficiency 03/08/2011    Past Surgical History:  Procedure Laterality Date  . BIOPSY THYROID  10/2007   benign biopsy of thyroid nodules.  [name] PATENT FORAMEN OVALE CLOSURE  07/03/2016  . PATENT FORAMEN OVALE CLOSURE N/A 07/03/2016   Procedure: PFO  Closure;  Surgeon: [name], MD;  Location: [place] CV [facility];  Service: Cardiovascular;  Laterality: N/A;  . TEE WITHOUT CARDIOVERSION N/A 02/10/2014   Procedure: TRANSESOPHAGEAL ECHOCARDIOGRAM (TEE);  Surgeon: [name], MD;  Location: [facility] ENDOSCOPY;  Service: Cardiovascular;  Laterality: N/A;    There were no vitals filed for this visit.      Subjective Assessment - 10/08/16 1616    Subjective Pt reports no benefit from taping, stating taping started to roll by later the same day and she removed by Saturday.   Patient Stated Goals reduce pain, improve  function   Currently in Pain? 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   Leisure sing, dance     AROM   Left Shoulder Flexion 118 Degrees   Left Shoulder ABduction 100 Degrees   Left Shoulder Internal Rotation --  FIR to L4   Left Shoulder External Rotation --  FER to C7/T1     PROM   Left Shoulder Flexion 135 Degrees   Left Shoulder ABduction 137 Degrees   Left Shoulder Internal Rotation 79 Degrees   Left  Shoulder External Rotation 76 Degrees     Strength   Right Shoulder Flexion 4+/5   Right Shoulder ABduction 4+/5   Right Shoulder Internal Rotation 4+/5   Right Shoulder External Rotation 4/5   Left Shoulder Flexion 4-/5   Left Shoulder ABduction 4-/5   Left Shoulder Internal Rotation 4-/5   Left Shoulder External Rotation 3+/5                     [facility] Adult PT Treatment/Exercise - 10/08/16 1615      Shoulder Exercises: Pulleys   Flexion 3 minutes     Manual Therapy   Manual Therapy Joint mobilization;Passive ROM;Soft tissue mobilization   Joint Mobilization Grade III-IV joint mobs - AP/PA/inferior - patient very tolerable & noting improving tolerance for ROm after mobs   Soft tissue mobilization posterior capsule -  subscapularis, teres major/minor   Passive ROM gentle PROM with MWM into flexion, abduction, ER and IR for approx 10 x 15" holds - minimal overpressure to prevent pain                  PT Short Term Goals - 10/08/16 1816      PT SHORT TERM GOAL #1   Title patient to improve L shoulder PROM equal to that of R shoulder with no increase in pain (11/05/16)   Status Revised           PT Long Term Goals - 10/08/16 1655      PT LONG TERM GOAL #1   Title patient to be independent with advanced HEP (12/03/16)   Status Revised     PT LONG TERM GOAL #2   Title patient to improve L shoulder AROM equal to that of R shouler with no increase in pain (12/03/16)   Status Revised     PT LONG TERM GOAL #3   Title Patient to improve L shoulder strength to >/= 4+5 with no increase in pain (12/03/16)   Status Revised     PT LONG TERM GOAL #4   Title Patient to report return to normal ADLs and work related task with pain no greater than 1/10 for greater than 2 weeks demonstrating improved functional use of L shoulder (12/03/16)   Status Revised               Plan - 10/08/16 1658    Clinical Impression Statement Pt reporting benefit from PT with decreasing pain and increasing functional ROM of L shoulder with pt now able ot reach partially behind her back to don/doff jacket and able to reach the back of her head for bathing/grooming, but still limited with overhead reaching esp with job tasks such as hanging IV bags. L shoulder AROM & PROM improved in all planes (refer to above assessment) but lacks functional strength and control, especially against gravity. Pt demonstrates good potential to benefit from further PT to restore functional ROM and strength to allow for improved functional use of L UE and return to full duty at work, therefore recommend recert for additional 2x/wk x 8 weeks.   Rehab Potential Good   PT Frequency 2x / week   PT Duration 8 weeks   PT Treatment/Interventions  ADLs/Self Care Home Management;Cryotherapy;Electrical Stimulation;Iontophoresis 4mg /ml Dexamethasone;Moist Heat;Ultrasound;Therapeutic exercise;Therapeutic activities;Patient/family education;Manual techniques;Passive range of motion;Vasopneumatic Device;Taping;Dry needling   PT Next Visit Plan gentle ROM per patient tolerance progressing to strengthening as pain allows; manual therapy for joint mobs and to address muscular tightness; ionto patch as indicated  and continued benefit noted   Consulted and Agree with Plan of Care Patient      Patient will benefit from skilled therapeutic intervention in order to improve the following deficits and impairments:  Decreased activity tolerance, Decreased range of motion, Decreased strength, Impaired UE functional use, Pain  Visit Diagnosis: Acute pain of right shoulder  Stiffness of right shoulder, not elsewhere classified  Abnormal posture  Muscle weakness (generalized)     Problem List Patient Active Problem List   Diagnosis Date Noted  . Pain of left patellofemoral joint 09/12/2016  . Adhesive capsulitis of left shoulder 06/30/2016  . Palpitations 04/02/2016  . Anemia 07/08/2015  . Cervical cancer screening 07/04/2015  . Dysphagia 04/05/2015  . Low back pain 11/21/2014  . Muscle spasm of back 04/11/2014  . Cryptogenic stroke ([place]) 01/27/2014  . PFO (patent foramen ovale) 01/23/2014  . Menorrhagia 03/07/2013  . Hyperlipidemia, mixed 02/08/2013  . GERD (gastroesophageal reflux disease) 02/08/2013  . Migraine 01/03/2013  . Conjunctival hemorrhage of left eye 09/23/2011  . Vitamin D deficiency 03/08/2011  . 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 Thyroid nodule 01/15/2011  . Nevus 01/15/2011  . Breast calcification, left 01/15/2011    Percival Spanish, PT, MPT 10/08/2016, 6:29 PM  [facility] [address]  [place], [place], [address] Phone: [phone]    Fax:  [phone]  Name: Melinda Kirk MRN: [id number] Date of Birth: 1970/08/27

## 2016-10-10 ENCOUNTER — Ambulatory Visit (INDEPENDENT_AMBULATORY_CARE_PROVIDER_SITE_OTHER): Payer: 59 | Admitting: Sports Medicine

## 2016-10-10 ENCOUNTER — Ambulatory Visit: Payer: 59 | Admitting: Physical Therapy

## 2016-10-10 ENCOUNTER — Encounter: Payer: Self-pay | Admitting: Sports Medicine

## 2016-10-10 DIAGNOSIS — M7502 Adhesive capsulitis of left shoulder: Secondary | ICD-10-CM

## 2016-10-10 DIAGNOSIS — M25511 Pain in right shoulder: Secondary | ICD-10-CM | POA: Diagnosis not present

## 2016-10-10 DIAGNOSIS — R293 Abnormal posture: Secondary | ICD-10-CM

## 2016-10-10 DIAGNOSIS — M6281 Muscle weakness (generalized): Secondary | ICD-10-CM

## 2016-10-10 DIAGNOSIS — M25611 Stiffness of right shoulder, not elsewhere classified: Secondary | ICD-10-CM

## 2016-10-10 NOTE — Therapy (Signed)
[facility] [address]  [place], [place], [address] Phone: [phone]   Fax:  [phone]  Physical Therapy Treatment  Patient Details  Name: Melinda Kirk MRN: [id number] Date of Birth: October 09, 1970 Referring Provider: Silverio Decamp, MD (pt switching from Karlton Lemon, MD)  Encounter Date: 10/10/2016      PT End of Session - 10/10/16 1449    Visit Number 15   Number of Visits 30   Date for PT Re-Evaluation 12/03/16   PT Start Time 1740   PT Stop Time 1744   PT Time Calculation (min) 59 min   Activity Tolerance Patient tolerated treatment well   Behavior During Therapy [facility] for tasks assessed/performed      Past Medical History:  Diagnosis Date  . Anemia 07/08/2015  . Breast calcification, left 07/29/04  . Cryptogenic stroke ([place]) 2015   a. s/p PFO closure on 07/03/16  . GERD (gastroesophageal reflux disease)   . Menorrhagia 03/07/2013  . PFO (patent foramen ovale)    a. s/p PFO closure 07/03/16  . Thyroid nodule    s/p FNA- 4/09- benign  . Unspecified hypothyroidism 01/15/2011  . Vitamin D deficiency 03/08/2011    Past Surgical History:  Procedure Laterality Date  . BIOPSY THYROID  10/2007   benign biopsy of thyroid nodules.  [name] PATENT FORAMEN OVALE CLOSURE  07/03/2016  . PATENT FORAMEN OVALE CLOSURE N/A 07/03/2016   Procedure: PFO  Closure;  Surgeon: [name], MD;  Location: [place] CV LAB;  Service: Cardiovascular;  Laterality: N/A;  . TEE WITHOUT CARDIOVERSION N/A 02/10/2014   Procedure: TRANSESOPHAGEAL ECHOCARDIOGRAM (TEE);  Surgeon: [name], MD;  Location: [facility] ENDOSCOPY;  Service: Cardiovascular;  Laterality: N/A;    There were no vitals filed for this visit.      Subjective Assessment - 10/10/16 1448    Subjective Overall feeling better - saw MD today - pleased with progress   Patient Stated Goals reduce pain, improve function   Currently in Pain? No/denies   Pain Score  0-No pain                         [facility] Adult PT Treatment/Exercise - 10/10/16 1450      Shoulder Exercises: Supine   Horizontal ABduction Strengthening;Both;15 reps;Theraband   Theraband Level (Shoulder Horizontal ABduction) Level 1 (Yellow)   Horizontal ABduction Limitations hooklying on pool noodle - emphasis on scap retraction   External Rotation Strengthening;Both;15 reps;Theraband   Theraband Level (Shoulder External Rotation) Level 1 (Yellow)   External Rotation Limitations hooklying on pool noodle - emphasis on scap retraction     Shoulder Exercises: Prone   Retraction Strengthening;Left;15 reps;Weights   Retraction Weight (lbs) 2   Retraction Limitations prone row with scap squeeze   Extension Strengthening;Left;15 reps;Weights   Extension Weight (lbs) 1   Extension Limitations prone I's with scap squeeze     Shoulder Exercises: Standing   Other Standing Exercises eccentric abduction at wall x 10 reps     Shoulder Exercises: ROM/Strengthening   UBE (Upper Arm Bike) Level 3 x 6 minutes (3/3)     Modalities   Modalities Vasopneumatic     Vasopneumatic   Number Minutes Vasopneumatic  15 minutes   Vasopnuematic Location  Shoulder   Vasopneumatic Pressure Medium   Vasopneumatic Temperature  coldest temp.     Manual Therapy   Manual Therapy Joint mobilization;Passive ROM   Joint Mobilization Grade  III-IV joint mobs - AP and inferior for improved ROM   Passive ROM PROM into all planes - improved range with flexion and IR; MWM with abduction PROM; 10 x 10" with each                  PT Short Term Goals - 10/08/16 1816      PT SHORT TERM GOAL #1   Title patient to improve L shoulder PROM equal to that of R shoulder with no increase in pain (11/05/16)   Status Revised           PT Long Term Goals - 10/08/16 1655      PT LONG TERM GOAL #1   Title patient to be independent with advanced HEP (12/03/16)   Status Revised     PT LONG TERM  GOAL #2   Title patient to improve L shoulder AROM equal to that of R shouler with no increase in pain (12/03/16)   Status Revised     PT LONG TERM GOAL #3   Title Patient to improve L shoulder strength to >/= 4+5 with no increase in pain (12/03/16)   Status Revised     PT LONG TERM GOAL #4   Title Patient to report return to normal ADLs and work related task with pain no greater than 1/10 for greater than 2 weeks demonstrating improved functional use of L shoulder (12/03/16)   Status Revised               Plan - 10/10/16 1449    Clinical Impression Statement Patient seen by MD today - pleased with current progress. Continued strength and ROM deficits with some pain with passive stretching into end ranges. Today incorporating more strengthening tasks with patient tolerable to this, some visible weakness noted with horizontal abduction and ER with light resistance. Will continue to progress ROM and strength to allow patient to return to full time work duties.    PT Treatment/Interventions ADLs/Self Care Home Management;Cryotherapy;Electrical Stimulation;Iontophoresis 4mg /ml Dexamethasone;Moist Heat;Ultrasound;Therapeutic exercise;Therapeutic activities;Patient/family education;Manual techniques;Passive range of motion;Vasopneumatic Device;Taping;Dry needling   PT Next Visit Plan gentle ROM per patient tolerance progressing to strengthening as pain allows; manual therapy for joint mobs and to address muscular tightness; ionto patch as indicated and continued benefit noted   Consulted and Agree with Plan of Care Patient      Patient will benefit from skilled therapeutic intervention in order to improve the following deficits and impairments:  Decreased activity tolerance, Decreased range of motion, Decreased strength, Impaired UE functional use, Pain  Visit Diagnosis: Acute pain of right shoulder  Stiffness of right shoulder, not elsewhere classified  Abnormal posture  Muscle weakness  (generalized)     Problem List Patient Active Problem List   Diagnosis Date Noted  . Pain of left patellofemoral joint 09/12/2016  . Adhesive capsulitis of left shoulder 06/30/2016  . Palpitations 04/02/2016  . Anemia 07/08/2015  . Cervical cancer screening 07/04/2015  . Dysphagia 04/05/2015  . Low back pain 11/21/2014  . Muscle spasm of back 04/11/2014  . Cryptogenic stroke ([place]) 01/27/2014  . PFO (patent foramen ovale) 01/23/2014  . Menorrhagia 03/07/2013  . Hyperlipidemia, mixed 02/08/2013  . GERD (gastroesophageal reflux disease) 02/08/2013  . Migraine 01/03/2013  . Conjunctival hemorrhage of left eye 09/23/2011  . Vitamin D deficiency 03/08/2011  . 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 Thyroid nodule 01/15/2011  . Nevus 01/15/2011  . Breast calcification, left 01/15/2011     Ailene Rud  Marjory Lies, PT, DPT 10/10/16 6:19 PM   [facility] [address]  [place], [place], [address] Phone: [phone]   Fax:  [phone]  Name: Melinda Kirk MRN: [id number] Date of Birth: 1970/08/28

## 2016-10-10 NOTE — Progress Notes (Signed)
  Subjective:    CC: Follow-up  HPI: Left frozen shoulder: Essentially completely resolved with fantastic return of range of motion after a glenohumeral injection, continues to work hard with physical therapy. Still has a bit of pain over the tip of the acromion when lifting and pushing heavy things, has been back to work but is doing light duty.  Past medical history:  Negative.  See flowsheet/record as well for more information.  Surgical history: Negative.  See flowsheet/record as well for more information.  Family history: Negative.  See flowsheet/record as well for more information.  Social history: Negative.  See flowsheet/record as well for more information.  Allergies, and medications have been entered into the medical record, reviewed, and no changes needed.   Review of Systems: No fevers, chills, night sweats, weight loss, chest pain, or shortness of breath.   Objective:    General: Well Developed, well nourished, and in no acute distress.  Neuro: Alert and oriented x3, extra-ocular muscles intact, sensation grossly intact.  HEENT: Normocephalic, atraumatic, pupils equal round reactive to light, neck supple, no masses, no lymphadenopathy, thyroid nonpalpable.  Skin: Warm and dry, no rashes. Cardiac: Regular rate and rhythm, no murmurs rubs or gallops, no lower extremity edema.  Respiratory: Clear to auscultation bilaterally. Not using accessory muscles, speaking in full sentences. Left Shoulder: Inspection reveals no abnormalities, atrophy or asymmetry. Palpation is normal with no tenderness over AC joint or bicipital groove. ROM is full in all planes. Rotator cuff strength normal throughout. No signs of impingement with negative Neer and Hawkin's tests, empty can. Speeds and Yergason's tests normal. No labral pathology noted with negative Obrien's, negative crank, negative clunk, and good stability. Normal scapular function observed. No painful arc and no drop arm sign. No  apprehension sign  FMLA paperwork filled out today  Impression and Recommendations:    Adhesive capsulitis of left shoulder Fantastic response to glenohumeral injection with return of full range of motion. FMLA paperwork filled out today. She will continue light duty for now until cleared with physical therapy, overall her range of motion is full, strength is good. I do anticipate another month of light duty before being cleared to full duty.  I spent 25 minutes with this patient, greater than 50% was face-to-face time counseling regarding the above diagnoses

## 2016-10-10 NOTE — Assessment & Plan Note (Signed)
Fantastic response to glenohumeral injection with return of full range of motion. FMLA paperwork filled out today. She will continue light duty for now until cleared with physical therapy, overall her range of motion is full, strength is good. I do anticipate another month of light duty before being cleared to full duty.

## 2016-10-15 ENCOUNTER — Ambulatory Visit: Payer: 59 | Admitting: Physical Therapy

## 2016-10-15 DIAGNOSIS — M6281 Muscle weakness (generalized): Secondary | ICD-10-CM

## 2016-10-15 DIAGNOSIS — M25511 Pain in right shoulder: Secondary | ICD-10-CM

## 2016-10-15 DIAGNOSIS — M25611 Stiffness of right shoulder, not elsewhere classified: Secondary | ICD-10-CM

## 2016-10-15 DIAGNOSIS — R293 Abnormal posture: Secondary | ICD-10-CM | POA: Diagnosis not present

## 2016-10-15 NOTE — Therapy (Signed)
[facility] [address]  [place], [place], [address] Phone: [phone]   Fax:  [phone]  Physical Therapy Treatment  Patient Details  Name: Melinda Kirk MRN: [id number] Date of Birth: Jan 07, 1971 Referring Provider: Silverio Decamp, MD (pt switching from Karlton Lemon, MD)  Encounter Date: 10/15/2016      PT End of Session - 10/15/16 1616    Visit Number 16   Number of Visits 30   Date for PT Re-Evaluation 12/03/16   PT Start Time 1616   PT Stop Time 1700   PT Time Calculation (min) 44 min   Activity Tolerance Patient tolerated treatment well   Behavior During Therapy [facility] for tasks assessed/performed      Past Medical History:  Diagnosis Date  . Anemia 07/08/2015  . Breast calcification, left 07/29/04  . Cryptogenic stroke ([place]) 2015   a. s/p PFO closure on 07/03/16  . GERD (gastroesophageal reflux disease)   . Menorrhagia 03/07/2013  . PFO (patent foramen ovale)    a. s/p PFO closure 07/03/16  . Thyroid nodule    s/p FNA- 4/09- benign  . Unspecified hypothyroidism 01/15/2011  . Vitamin D deficiency 03/08/2011    Past Surgical History:  Procedure Laterality Date  . BIOPSY THYROID  10/2007   benign biopsy of thyroid nodules.  [name] PATENT FORAMEN OVALE CLOSURE  07/03/2016  . PATENT FORAMEN OVALE CLOSURE N/A 07/03/2016   Procedure: PFO  Closure;  Surgeon: [name], MD;  Location: [facility];  Service: Cardiovascular;  Laterality: N/A;  . TEE WITHOUT CARDIOVERSION N/A 02/10/2014   Procedure: TRANSESOPHAGEAL ECHOCARDIOGRAM (TEE);  Surgeon: [name], MD;  Location: [facility] ENDOSCOPY;  Service: Cardiovascular;  Laterality: N/A;    There were no vitals filed for this visit.      Subjective Assessment - 10/15/16 1620    Subjective Pt noting some intermittent stabbing pain at time but low intensity (2/10). No pain currently.   Patient Stated Goals reduce pain, improve function   Currently  in Pain? No/denies            [facility] PT Assessment - 10/15/16 1616      Assessment   Next MD Visit ~11/07/16                     [facility] Adult PT Treatment/Exercise - 10/15/16 1616      Shoulder Exercises: Supine   Horizontal ABduction Strengthening;Both;15 reps;Theraband   Theraband Level (Shoulder Horizontal ABduction) Level 1 (Yellow)   Horizontal ABduction Limitations hooklying on pool noodle - emphasis on scap retraction   External Rotation Strengthening;Both;15 reps;Theraband   Theraband Level (Shoulder External Rotation) Level 1 (Yellow)   External Rotation Limitations hooklying on pool noodle - emphasis on scap retraction   Flexion Strengthening;Both;10 reps;Theraband   Theraband Level (Shoulder Flexion) Level 1 (Yellow)   Flexion Limitations alt with opp shoulder extension, hookyling on pool noodle     Shoulder Exercises: Pulleys   Flexion 3 minutes   ABduction 3 minutes     Shoulder Exercises: ROM/Strengthening   UBE (Upper Arm Bike) Level 3 x 6 minutes (3/3)   Other ROM/Strengthening Exercises BATCA pull-down (emphasis on scap retraction with passive release for flexion AAROM) 15# - 10x5"     Shoulder Exercises: Stretch   Other Shoulder Stretches B pec chest stretch hooklying on pool noodle x 60"     Manual Therapy   Manual Therapy Joint mobilization;Soft tissue mobilization;Passive ROM  Joint Mobilization Grade III-IV joint mobs - AP and inferior for improved ROM   Soft tissue mobilization posterior capsule - subscapularis, teres major/minor   Passive ROM gentle PROM with MWM into flexion, abduction, ER and IR for approx 10 x 10 holds                  PT Short Term Goals - 10/15/16 1621      PT SHORT TERM GOAL #1   Title patient to improve L shoulder PROM equal to that of R shoulder with no increase in pain (11/05/16)   Status On-going           PT Long Term Goals - 10/15/16 1622      PT LONG TERM GOAL #1   Title patient to be  independent with advanced HEP (12/03/16)   Status On-going     PT LONG TERM GOAL #2   Title patient to improve L shoulder AROM equal to that of R shouler with no increase in pain (12/03/16)   Status On-going     PT LONG TERM GOAL #3   Title Patient to improve L shoulder strength to >/= 4+5 with no increase in pain (12/03/16)   Status On-going     PT LONG TERM GOAL #4   Title Patient to report return to normal ADLs and work related task with pain no greater than 1/10 for greater than 2 weeks demonstrating improved functional use of L shoulder (12/03/16)   Status On-going               Plan - 10/15/16 1622    Clinical Impression Statement Pt reporting increased soreness in upper/posterior-lateral shoulder with exercise attempts today, therefore increased emphasis on manual therapy to address muscle tightness which seems to be contributing to pain. Pt continues to requre cueing to engage scapular stabilizers during exercises.   Rehab Potential Good   PT Treatment/Interventions ADLs/Self Care Home Management;Cryotherapy;Electrical Stimulation;Iontophoresis 4mg /ml Dexamethasone;Moist Heat;Ultrasound;Therapeutic exercise;Therapeutic activities;Patient/family education;Manual techniques;Passive range of motion;Vasopneumatic Device;Taping;Dry needling   PT Next Visit Plan gentle ROM per patient tolerance progressing to strengthening as pain allows; manual therapy for joint mobs and to address muscular tightness; ionto patch as indicated and continued benefit noted   Consulted and Agree with Plan of Care Patient      Patient will benefit from skilled therapeutic intervention in order to improve the following deficits and impairments:  Decreased activity tolerance, Decreased range of motion, Decreased strength, Impaired UE functional use, Pain  Visit Diagnosis: Acute pain of right shoulder  Stiffness of right shoulder, not elsewhere classified  Abnormal posture  Muscle weakness  (generalized)     Problem List Patient Active Problem List   Diagnosis Date Noted  . Pain of left patellofemoral joint 09/12/2016  . Adhesive capsulitis of left shoulder 06/30/2016  . Palpitations 04/02/2016  . Anemia 07/08/2015  . Cervical cancer screening 07/04/2015  . Dysphagia 04/05/2015  . Low back pain 11/21/2014  . Muscle spasm of back 04/11/2014  . Cryptogenic stroke ([place]) 01/27/2014  . PFO (patent foramen ovale) 01/23/2014  . Menorrhagia 03/07/2013  . Hyperlipidemia, mixed 02/08/2013  . GERD (gastroesophageal reflux disease) 02/08/2013  . Migraine 01/03/2013  . Conjunctival hemorrhage of left eye 09/23/2011  . Vitamin D deficiency 03/08/2011  . 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 Thyroid nodule 01/15/2011  . Nevus 01/15/2011  . Breast calcification, left 01/15/2011    Percival Spanish, PT, MPT  10/15/2016, 5:03 PM  [facility] [address]  [place], [place], [address] Phone: [phone]   Fax:  [phone]  Name: TAWANNA FUNK MRN: [id number] Date of Birth: [birth year]

## 2016-10-18 ENCOUNTER — Ambulatory Visit: Payer: 59 | Admitting: Physical Therapy

## 2016-10-18 DIAGNOSIS — M25611 Stiffness of right shoulder, not elsewhere classified: Secondary | ICD-10-CM

## 2016-10-18 DIAGNOSIS — M25511 Pain in right shoulder: Secondary | ICD-10-CM | POA: Diagnosis not present

## 2016-10-18 DIAGNOSIS — M6281 Muscle weakness (generalized): Secondary | ICD-10-CM | POA: Diagnosis not present

## 2016-10-18 DIAGNOSIS — R293 Abnormal posture: Secondary | ICD-10-CM

## 2016-10-18 NOTE — Therapy (Signed)
[facility] [address]  [place], [place], [address] Phone: [phone]   Fax:  [phone]  Physical Therapy Treatment  Patient Details  Name: Melinda Kirk MRN: [id number] Date of Birth: 04-12-1971 Referring Provider: Silverio Decamp, MD (pt switching from Karlton Lemon, MD)  Encounter Date: 10/18/2016      PT End of Session - 10/18/16 0807    Visit Number 17   Number of Visits 30   Date for PT Re-Evaluation 12/03/16   PT Start Time 0805   PT Stop Time 0843   PT Time Calculation (min) 38 min   Activity Tolerance Patient tolerated treatment well   Behavior During Therapy [facility] for tasks assessed/performed      Past Medical History:  Diagnosis Date  . Anemia 07/08/2015  . Breast calcification, left 07/29/04  . 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 s/p PFO closure on 07/03/16  . GERD (gastroesophageal reflux disease)   . Menorrhagia 03/07/2013  . PFO (patent foramen ovale)    a. s/p PFO closure 07/03/16  . Thyroid nodule    s/p FNA- 4/09- benign  . Unspecified hypothyroidism 01/15/2011  . Vitamin D deficiency 03/08/2011    Past Surgical History:  Procedure Laterality Date  . BIOPSY THYROID  10/2007   benign biopsy of thyroid nodules.  [name] PATENT FORAMEN OVALE CLOSURE  07/03/2016  . PATENT FORAMEN OVALE CLOSURE N/A 07/03/2016   Procedure: PFO  Closure;  Surgeon: [name], MD;  Location: [facility];  Service: Cardiovascular;  Laterality: N/A;  . TEE WITHOUT CARDIOVERSION N/A 02/10/2014   Procedure: TRANSESOPHAGEAL ECHOCARDIOGRAM (TEE);  Surgeon: [name], MD;  Location: [facility] ENDOSCOPY;  Service: Cardiovascular;  Laterality: N/A;    There were no vitals filed for this visit.      Subjective Assessment - 10/18/16 0807    Subjective feeling well - continues to go to [facility]   Patient Stated Goals reduce pain, improve function   Currently in Pain? No/denies   Pain Score 0-No pain                          [facility] Adult PT Treatment/Exercise - 10/18/16 0808      Shoulder Exercises: Supine   Horizontal ABduction Strengthening;Both;15 reps;Theraband   Theraband Level (Shoulder Horizontal ABduction) Level 2 (Red)   Horizontal ABduction Limitations hooklying on pool noodle - emphasis on scap retraction   External Rotation Strengthening;Both;15 reps;Theraband   Theraband Level (Shoulder External Rotation) Level 2 (Red)   External Rotation Limitations hooklying on pool noodle - emphasis on scap retraction     Shoulder Exercises: Prone   Retraction Strengthening;Left;15 reps;Weights   Retraction Weight (lbs) 2   Retraction Limitations prone row with scap squeeze   Extension Strengthening;Left;15 reps;Weights   Extension Weight (lbs) 2   Extension Limitations prone I's with scap squeeze     Shoulder Exercises: Sidelying   External Rotation Strengthening;Left;15 reps;Weights   External Rotation Weight (lbs) 2   ABduction AROM;Left;15 reps     Shoulder Exercises: Standing   Other Standing Exercises 2 level cabinet reaches - flexion and abduction 1# x 15 reps each      Shoulder Exercises: Pulleys   Flexion 3 minutes   ABduction 3 minutes     Shoulder Exercises: ROM/Strengthening   Cybex Row Limitations 20# - 15 x 5" hold   Other ROM/Strengthening Exercises BATCA pull-down - VC for scap squeeze -  15# 15 x 5" hold - attmepted 20# patinet reporitng too heavy                  PT Short Term Goals - 10/15/16 1621      PT SHORT TERM GOAL #1   Title patient to improve L shoulder PROM equal to that of R shoulder with no increase in pain (11/05/16)   Status On-going           PT Long Term Goals - 10/15/16 1622      PT LONG TERM GOAL #1   Title patient to be independent with advanced HEP (12/03/16)   Status On-going     PT LONG TERM GOAL #2   Title patient to improve L shoulder AROM equal to that of R shouler with no increase in pain  (12/03/16)   Status On-going     PT LONG TERM GOAL #3   Title Patient to improve L shoulder strength to >/= 4+5 with no increase in pain (12/03/16)   Status On-going     PT LONG TERM GOAL #4   Title Patient to report return to normal ADLs and work related task with pain no greater than 1/10 for greater than 2 weeks demonstrating improved functional use of L shoulder (12/03/16)   Status On-going               Plan - 10/18/16 0808    Clinical Impression Statement Good improvements in ROM and strength - some continued difficulty with abduction above shoulder height against gravity requiring continued work. Able to incorporate more strengthening tasks today with no increase in pain. Does require continued VC for scap retraction during all shoulder activities to promote improved shoulder biomechanics.    PT Treatment/Interventions ADLs/Self Care Home Management;Cryotherapy;Electrical Stimulation;Iontophoresis 4mg /ml Dexamethasone;Moist Heat;Ultrasound;Therapeutic exercise;Therapeutic activities;Patient/family education;Manual techniques;Passive range of motion;Vasopneumatic Device;Taping;Dry needling   PT Next Visit Plan gentle ROM per patient tolerance progressing to strengthening as pain allows; manual therapy for joint mobs and to address muscular tightness; ionto patch as indicated and continued benefit noted   Consulted and Agree with Plan of Care Patient      Patient will benefit from skilled therapeutic intervention in order to improve the following deficits and impairments:  Decreased activity tolerance, Decreased range of motion, Decreased strength, Impaired UE functional use, Pain  Visit Diagnosis: Acute pain of right shoulder  Stiffness of right shoulder, not elsewhere classified  Abnormal posture  Muscle weakness (generalized)     Problem List Patient Active Problem List   Diagnosis Date Noted  . Pain of left patellofemoral joint 09/12/2016  . Adhesive capsulitis of  left shoulder 06/30/2016  . Palpitations 04/02/2016  . Anemia 07/08/2015  . Cervical cancer screening 07/04/2015  . Dysphagia 04/05/2015  . Low back pain 11/21/2014  . Muscle spasm of back 04/11/2014  . Cryptogenic stroke ([place]) 01/27/2014  . PFO (patent foramen ovale) 01/23/2014  . Menorrhagia 03/07/2013  . Hyperlipidemia, mixed 02/08/2013  . GERD (gastroesophageal reflux disease) 02/08/2013  . Migraine 01/03/2013  . Conjunctival hemorrhage of left eye 09/23/2011  . Vitamin D deficiency 03/08/2011  . 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 Thyroid nodule 01/15/2011  . Nevus 01/15/2011  . Breast calcification, left 01/15/2011     Lanney Gins, PT, DPT 10/18/16 9:30 AM   [facility] [address]  [place], [place], [address] Phone: [phone]   Fax:  [phone]  Name: Melinda Kirk  Screws MRN: [id number] Date of Birth: [birth year]

## 2016-10-24 ENCOUNTER — Ambulatory Visit: Payer: 59 | Admitting: Physical Therapy

## 2016-10-24 DIAGNOSIS — R293 Abnormal posture: Secondary | ICD-10-CM | POA: Diagnosis not present

## 2016-10-24 DIAGNOSIS — M6281 Muscle weakness (generalized): Secondary | ICD-10-CM

## 2016-10-24 DIAGNOSIS — M25511 Pain in right shoulder: Secondary | ICD-10-CM

## 2016-10-24 DIAGNOSIS — M25611 Stiffness of right shoulder, not elsewhere classified: Secondary | ICD-10-CM | POA: Diagnosis not present

## 2016-10-24 NOTE — Therapy (Signed)
[facility] [address]  [place], [place], [address] Phone: [phone]   Fax:  [phone]  Physical Therapy Treatment  Patient Details  Name: JANIJAH SYMONS MRN: [id number] Date of Birth: 03-Dec-1970 Referring Provider: Silverio Decamp, MD (pt switching from Karlton Lemon, MD)  Encounter Date: 10/24/2016      PT End of Session - 10/24/16 1650    Visit Number 18   Number of Visits 30   Date for PT Re-Evaluation 12/03/16   PT Start Time 8341   PT Stop Time 1738   PT Time Calculation (min) 48 min   Activity Tolerance Patient tolerated treatment well   Behavior During Therapy [facility] for tasks assessed/performed      Past Medical History:  Diagnosis Date  . Anemia 07/08/2015  . Breast calcification, left 07/29/04  . 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 s/p PFO closure on 07/03/16  . GERD (gastroesophageal reflux disease)   . Menorrhagia 03/07/2013  . PFO (patent foramen ovale)    a. s/p PFO closure 07/03/16  . Thyroid nodule    s/p FNA- 4/09- benign  . Unspecified hypothyroidism 01/15/2011  . Vitamin D deficiency 03/08/2011    Past Surgical History:  Procedure Laterality Date  . BIOPSY THYROID  10/2007   benign biopsy of thyroid nodules.  [name] PATENT FORAMEN OVALE CLOSURE  07/03/2016  . PATENT FORAMEN OVALE CLOSURE N/A 07/03/2016   Procedure: PFO  Closure;  Surgeon: [name], MD;  Location: [facility];  Service: Cardiovascular;  Laterality: N/A;  . TEE WITHOUT CARDIOVERSION N/A 02/10/2014   Procedure: TRANSESOPHAGEAL ECHOCARDIOGRAM (TEE);  Surgeon: [name], MD;  Location: [facility] ENDOSCOPY;  Service: Cardiovascular;  Laterality: N/A;    There were no vitals filed for this visit.      Subjective Assessment - 10/24/16 1652    Subjective Pt w/o pain today. Reports she was able to lift 25# bag of rice w/o problems.   Patient Stated Goals reduce pain, improve function   Currently in Pain?  No/denies                         [facility] Adult PT Treatment/Exercise - 10/24/16 1650      Shoulder Exercises: Supine   Protraction Left;15 reps;Weights   Protraction Weight (lbs) 3   Horizontal ABduction Strengthening;Both;20 reps;Theraband   Theraband Level (Shoulder Horizontal ABduction) Level 2 (Red)   Horizontal ABduction Limitations hooklying on pool noodle - emphasis on scap retraction   External Rotation Strengthening;Both;20 reps;Theraband   Theraband Level (Shoulder External Rotation) Level 2 (Red)   External Rotation Limitations hooklying on pool noodle - emphasis on scap retraction   Flexion Strengthening;Both;10 reps;Theraband  10 reps per side   Theraband Level (Shoulder Flexion) Level 2 (Red)   Flexion Limitations alt with opp shoulder extension, hookyling on pool noodle   Other Supine Exercises L shoulder circles at 90 dg flexion CW/CCW 1# x10 each     Shoulder Exercises: Prone   Extension Strengthening;Both;15 reps;Weights   Extension Weight (lbs) 2   Extension Limitations I's over green Pball   Horizontal ABduction 1 Strengthening;15 reps;Weights   Horizontal ABduction 1 Weight (lbs) 1   Horizontal ABduction 1 Limitations T's over green Pball     Shoulder Exercises: Sidelying   External Rotation Strengthening;Left;20 reps;Weights   External Rotation Weight (lbs) 2   ABduction Strengthening;Left;15 reps;Weights   ABduction Weight (lbs) 2   ABduction  Limitations 20-100 dg (pain-free range)   Other Sidelying Exercises L shoulder circles at 90 dg ABD CW/CCW 1# x10 each     Shoulder Exercises: Pulleys   Flexion 3 minutes   ABduction 3 minutes     Shoulder Exercises: Therapy Ball   Flexion 10 reps   Flexion Limitations 1# on L with orange Pball on wall - 5" stretch at top of motion     Shoulder Exercises: ROM/Strengthening   UBE (Upper Arm Bike) Level 3 x 6 minutes (3/3)     Shoulder Exercises: Stretch   Other Shoulder Stretches B pec chest  stretch hooklying on pool noodle x 60"                  PT Short Term Goals - 10/15/16 1621      PT SHORT TERM GOAL #1   Title patient to improve L shoulder PROM equal to that of R shoulder with no increase in pain (11/05/16)   Status On-going           PT Long Term Goals - 10/15/16 1622      PT LONG TERM GOAL #1   Title patient to be independent with advanced HEP (12/03/16)   Status On-going     PT LONG TERM GOAL #2   Title patient to improve L shoulder AROM equal to that of R shouler with no increase in pain (12/03/16)   Status On-going     PT LONG TERM GOAL #3   Title Patient to improve L shoulder strength to >/= 4+5 with no increase in pain (12/03/16)   Status On-going     PT LONG TERM GOAL #4   Title Patient to report return to normal ADLs and work related task with pain no greater than 1/10 for greater than 2 weeks demonstrating improved functional use of L shoulder (12/03/16)   Status On-going               Plan - 10/24/16 1656    Clinical Impression Statement Pt remains painfree and noting increased functional use of L arm with daily tasks but is still limited with overhead use of arm. Continued focus on scapular strenthening along with stabilization and recruitment during progression of shouder exercises in all plane, with pt noting fatigue with exercises but no pain.   Rehab Potential Good   PT Treatment/Interventions ADLs/Self Care Home Management;Cryotherapy;Electrical Stimulation;Iontophoresis 4mg /ml Dexamethasone;Moist Heat;Ultrasound;Therapeutic exercise;Therapeutic activities;Patient/family education;Manual techniques;Passive range of motion;Vasopneumatic Device;Taping;Dry needling   PT Next Visit Plan gentle ROM per patient tolerance progressing to strengthening as pain allows; manual therapy for joint mobs and to address muscular tightness; ionto patch as indicated and continued benefit noted   Consulted and Agree with Plan of Care Patient       Patient will benefit from skilled therapeutic intervention in order to improve the following deficits and impairments:  Decreased activity tolerance, Decreased range of motion, Decreased strength, Impaired UE functional use, Pain  Visit Diagnosis: Acute pain of right shoulder  Stiffness of right shoulder, not elsewhere classified  Abnormal posture  Muscle weakness (generalized)     Problem List Patient Active Problem List   Diagnosis Date Noted  . Pain of left patellofemoral joint 09/12/2016  . Adhesive capsulitis of left shoulder 06/30/2016  . Palpitations 04/02/2016  . Anemia 07/08/2015  . Cervical cancer screening 07/04/2015  . Dysphagia 04/05/2015  . Low back pain 11/21/2014  . Muscle spasm of back 04/11/2014  . Cryptogenic stroke ([place]) 01/27/2014  . PFO (  patent foramen ovale) 01/23/2014  . Menorrhagia 03/07/2013  . Hyperlipidemia, mixed 02/08/2013  . GERD (gastroesophageal reflux disease) 02/08/2013  . Migraine 01/03/2013  . Conjunctival hemorrhage of left eye 09/23/2011  . Vitamin D deficiency 03/08/2011  . 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 Thyroid nodule 01/15/2011  . Nevus 01/15/2011  . Breast calcification, left 01/15/2011    Percival Spanish, PT, MPT 10/24/2016, 6:01 PM  [facility] [address]  [place], [place], [address] Phone: [phone]   Fax:  [phone]  Name: TEARA DUERKSEN MRN: [id number] Date of Birth: January 12, 1971

## 2016-10-25 ENCOUNTER — Ambulatory Visit: Payer: 59 | Admitting: Physical Therapy

## 2016-10-25 DIAGNOSIS — M6281 Muscle weakness (generalized): Secondary | ICD-10-CM

## 2016-10-25 DIAGNOSIS — R293 Abnormal posture: Secondary | ICD-10-CM

## 2016-10-25 DIAGNOSIS — M25611 Stiffness of right shoulder, not elsewhere classified: Secondary | ICD-10-CM

## 2016-10-25 DIAGNOSIS — M25511 Pain in right shoulder: Secondary | ICD-10-CM | POA: Diagnosis not present

## 2016-10-25 NOTE — Therapy (Signed)
[facility] [address]  [place], [place], [address] Phone: [phone]   Fax:  [phone]  Physical Therapy Treatment  Patient Details  Name: Melinda Kirk MRN: [id number] Date of Birth: 1970-12-04 Referring Provider: Silverio Decamp, MD (pt switching from Karlton Lemon, MD)  Encounter Date: 10/25/2016      PT End of Session - 10/25/16 0845    Visit Number 19   Number of Visits 30   Date for PT Re-Evaluation 12/03/16   PT Start Time 0845   PT Stop Time 0942   PT Time Calculation (min) 57 min   Activity Tolerance Patient tolerated treatment well   Behavior During Therapy [facility] for tasks assessed/performed      Past Medical History:  Diagnosis Date  . Anemia 07/08/2015  . Breast calcification, left 07/29/04  . 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 s/p PFO closure on 07/03/16  . GERD (gastroesophageal reflux disease)   . Menorrhagia 03/07/2013  . PFO (patent foramen ovale)    a. s/p PFO closure 07/03/16  . Thyroid nodule    s/p FNA- 4/09- benign  . Unspecified hypothyroidism 01/15/2011  . Vitamin D deficiency 03/08/2011    Past Surgical History:  Procedure Laterality Date  . BIOPSY THYROID  10/2007   benign biopsy of thyroid nodules.  [name] PATENT FORAMEN OVALE CLOSURE  07/03/2016  . PATENT FORAMEN OVALE CLOSURE N/A 07/03/2016   Procedure: PFO  Closure;  Surgeon: [name], MD;  Location: [place] [facility];  Service: Cardiovascular;  Laterality: N/A;  . TEE WITHOUT CARDIOVERSION N/A 02/10/2014   Procedure: TRANSESOPHAGEAL ECHOCARDIOGRAM (TEE);  Surgeon: [name], MD;  Location: [facility] ENDOSCOPY;  Service: Cardiovascular;  Laterality: N/A;    There were no vitals filed for this visit.      Subjective Assessment - 10/25/16 0848    Subjective Pt noting some tightness this morning after yesterday's therapy session but denies pain.   Patient Stated Goals reduce pain, improve function   Currently in  Pain? No/denies                         [facility] Adult PT Treatment/Exercise - 10/25/16 0845      Shoulder Exercises: Pulleys   Flexion 3 minutes   ABduction 3 minutes     Shoulder Exercises: ROM/Strengthening   UBE (Upper Arm Bike) Level 3 x 6 minutes (3/3)   Other ROM/Strengthening Exercises BATCA pull-down (emphasis on scap retraction with passive release for flexion AAROM) 20# - 15x5"     Shoulder Exercises: Stretch   Corner Stretch 30 seconds;1 rep   Corner Stretch Limitations 3 way doorway stretch   Other Shoulder Stretches L standing anterior capsule & R sidelying L open book stretch 2x30" each   Other Shoulder Stretches L posterior capsule stretch 2x30"     Modalities   Modalities Vasopneumatic     Vasopneumatic   Number Minutes Vasopneumatic  10 minutes   Vasopnuematic Location  Shoulder   Vasopneumatic Pressure Medium   Vasopneumatic Temperature  coldest temp.     Manual Therapy   Manual Therapy Joint mobilization;Soft tissue mobilization;Passive ROM   Joint Mobilization Grade III-IV joint mobs - AP and inferior for improved ROM   Soft tissue mobilization pecs & posterior capsule - subscapularis, teres major/minor   Passive ROM gentle PROM with MWM into flexion, abduction, ER and IR for approx 10 x 10 holds with contract/relax for a  few reps each                PT Education - 10/25/16 0930    Education provided Yes   Education Details L shoulder anterior & posterior capsule stretches   Person(s) Educated Patient   Methods Explanation;Demonstration;Handout   Comprehension Verbalized understanding;Returned demonstration;Need further instruction          PT Short Term Goals - 10/15/16 1621      PT SHORT TERM GOAL #1   Title patient to improve L shoulder PROM equal to that of R shoulder with no increase in pain (11/05/16)   Status On-going           PT Long Term Goals - 10/15/16 1622      PT LONG TERM GOAL #1   Title patient to  be independent with advanced HEP (12/03/16)   Status On-going     PT LONG TERM GOAL #2   Title patient to improve L shoulder AROM equal to that of R shouler with no increase in pain (12/03/16)   Status On-going     PT LONG TERM GOAL #3   Title Patient to improve L shoulder strength to >/= 4+5 with no increase in pain (12/03/16)   Status On-going     PT LONG TERM GOAL #4   Title Patient to report return to normal ADLs and work related task with pain no greater than 1/10 for greater than 2 weeks demonstrating improved functional use of L shoulder (12/03/16)   Status On-going               Plan - 10/25/16 0850    Clinical Impression Statement Pt continues to deny pain but reporting increased tightness today, therefore increased focus on manual therapy today. Pt demonstrating significantly increased tension in subscapularis and teres major/minor with pt demonstrating limited tolerance for STM/MFR. Educated pt on stretching options for anterior and posterior capsule with HEP instructions provided.   Rehab Potential Good   PT Treatment/Interventions ADLs/Self Care Home Management;Cryotherapy;Electrical Stimulation;Iontophoresis 4mg /ml Dexamethasone;Moist Heat;Ultrasound;Therapeutic exercise;Therapeutic activities;Patient/family education;Manual techniques;Passive range of motion;Vasopneumatic Device;Taping;Dry needling   PT Next Visit Plan gentle ROM per patient tolerance progressing to strengthening as pain allows; manual therapy for joint mobs and to address muscular tightness; ionto patch as indicated and continued benefit noted   Consulted and Agree with Plan of Care Patient      Patient will benefit from skilled therapeutic intervention in order to improve the following deficits and impairments:  Decreased activity tolerance, Decreased range of motion, Decreased strength, Impaired UE functional use, Pain  Visit Diagnosis: Acute pain of right shoulder  Stiffness of right shoulder, not  elsewhere classified  Abnormal posture  Muscle weakness (generalized)     Problem List Patient Active Problem List   Diagnosis Date Noted  . Pain of left patellofemoral joint 09/12/2016  . Adhesive capsulitis of left shoulder 06/30/2016  . Palpitations 04/02/2016  . Anemia 07/08/2015  . Cervical cancer screening 07/04/2015  . Dysphagia 04/05/2015  . Low back pain 11/21/2014  . Muscle spasm of back 04/11/2014  . Cryptogenic stroke ([place]) 01/27/2014  . PFO (patent foramen ovale) 01/23/2014  . Menorrhagia 03/07/2013  . Hyperlipidemia, mixed 02/08/2013  . GERD (gastroesophageal reflux disease) 02/08/2013  . Migraine 01/03/2013  . Conjunctival hemorrhage of left eye 09/23/2011  . Vitamin D deficiency 03/08/2011  . 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 Thyroid nodule 01/15/2011  . Nevus 01/15/2011  . Breast calcification, left 01/15/2011  Percival Spanish, PT, MPT 10/25/2016, 11:56 AM  [facility] [address]  [place], [place], [address] Phone: [phone]   Fax:  [phone]  Name: Melinda Kirk MRN: [id number] Date of Birth: February 20, 1971

## 2016-10-28 ENCOUNTER — Ambulatory Visit: Payer: 59 | Attending: Sports Medicine | Admitting: Physical Therapy

## 2016-10-28 DIAGNOSIS — M25612 Stiffness of left shoulder, not elsewhere classified: Secondary | ICD-10-CM | POA: Insufficient documentation

## 2016-10-28 DIAGNOSIS — M6281 Muscle weakness (generalized): Secondary | ICD-10-CM | POA: Diagnosis present

## 2016-10-28 DIAGNOSIS — M25512 Pain in left shoulder: Secondary | ICD-10-CM | POA: Diagnosis present

## 2016-10-28 DIAGNOSIS — R293 Abnormal posture: Secondary | ICD-10-CM | POA: Diagnosis present

## 2016-10-28 NOTE — Therapy (Signed)
[facility] [address]  [place], [place], [address] Phone: [phone]   Fax:  [phone]  Physical Therapy Treatment  Patient Details  Name: Melinda Kirk MRN: [id number] Date of Birth: 11-Jul-1971 Referring Provider: Silverio Decamp, MD  Encounter Date: 10/28/2016      PT End of Session - 10/28/16 1620    Visit Number 20   Number of Visits 30   Date for PT Re-Evaluation 12/03/16   PT Start Time 1620   PT Stop Time 1725   PT Time Calculation (min) 65 min   Activity Tolerance Patient tolerated treatment well   Behavior During Therapy [facility] for tasks assessed/performed      Past Medical History:  Diagnosis Date  . Anemia 07/08/2015  . Breast calcification, left 07/29/04  . Cryptogenic stroke ([place]) 2015   a. s/p PFO closure on 07/03/16  . GERD (gastroesophageal reflux disease)   . Menorrhagia 03/07/2013  . PFO (patent foramen ovale)    a. s/p PFO closure 07/03/16  . Thyroid nodule    s/p FNA- 4/09- benign  . Unspecified hypothyroidism 01/15/2011  . Vitamin D deficiency 03/08/2011    Past Surgical History:  Procedure Laterality Date  . BIOPSY THYROID  10/2007   benign biopsy of thyroid nodules.  [name] PATENT FORAMEN OVALE CLOSURE  07/03/2016  . PATENT FORAMEN OVALE CLOSURE N/A 07/03/2016   Procedure: PFO  Closure;  Surgeon: [name], MD;  Location: [place] [facility];  Service: Cardiovascular;  Laterality: N/A;  . TEE WITHOUT CARDIOVERSION N/A 02/10/2014   Procedure: TRANSESOPHAGEAL ECHOCARDIOGRAM (TEE);  Surgeon: [name], MD;  Location: [facility] ENDOSCOPY;  Service: Cardiovascular;  Laterality: N/A;    There were no vitals filed for this visit.      Subjective Assessment - 10/28/16 1627    Subjective Pt noting that she has to sleep with a pillow along her side under her arm because it is too uncomfortable to sleep.   Patient Stated Goals reduce pain, improve function   Currently in Pain?  No/denies            [facility] PT Assessment - 10/28/16 1620      Assessment   Medical Diagnosis L shoulder pain   Referring Provider Silverio Decamp, MD   Next MD Visit ~11/07/16     AROM   Left Shoulder Flexion 144 Degrees   Left Shoulder ABduction 137 Degrees   Left Shoulder Internal Rotation --  FIR to L2   Left Shoulder External Rotation --  FER to C7/T1     PROM   Left Shoulder Flexion 151 Degrees   Left Shoulder ABduction 142 Degrees   Left Shoulder Internal Rotation 82 Degrees   Left Shoulder External Rotation 78 Degrees     Strength   Right Shoulder Flexion 4+/5   Right Shoulder ABduction 4+/5   Right Shoulder Internal Rotation 4+/5   Right Shoulder External Rotation 4/5   Left Shoulder Flexion 4/5   Left Shoulder ABduction 4/5   Left Shoulder Internal Rotation 4/5   Left Shoulder External Rotation 4-/5                     [facility] Adult PT Treatment/Exercise - 10/28/16 1620      Shoulder Exercises: Sidelying   ABduction Strengthening;Left;15 reps;Weights   ABduction Weight (lbs) 2   ABduction Limitations 20-100 dg (pain-free range)     Shoulder Exercises: Standing   External Rotation  Strengthening;Left;15 reps;Theraband   Theraband Level (Shoulder External Rotation) Level 2 (Red)   Internal Rotation Strengthening;Left;15 reps;Theraband   Theraband Level (Shoulder Internal Rotation) Level 2 (Red)   Flexion Strengthening;Left;15 reps;Weights   Shoulder Flexion Weight (lbs) 1#   ABduction AAROM;10 reps   ABduction Limitations pt demonstrating substitution with shoulder hiking     Shoulder Exercises: Pulleys   Flexion 3 minutes   ABduction 3 minutes     Shoulder Exercises: ROM/Strengthening   UBE (Upper Arm Bike) Level 3.0 x 6 minutes (3/3)     Shoulder Exercises: Stretch   Internal Rotation Stretch 3 reps   Internal Rotation Stretch Limitations L sidelying sleeper stretch     Vasopneumatic   Number Minutes Vasopneumatic  15 minutes    Vasopnuematic Location  Shoulder   Vasopneumatic Pressure Medium   Vasopneumatic Temperature  coldest temp.     Manual Therapy   Manual Therapy Joint mobilization;Passive ROM   Joint Mobilization Grade III-IV joint mobs - AP and inferior for improved ROM   Passive ROM gentle PROM with MWM into flexion, abduction, ER and IR                   PT Short Term Goals - 10/15/16 1621      PT SHORT TERM GOAL #1   Title patient to improve L shoulder PROM equal to that of R shoulder with no increase in pain (11/05/16)   Status On-going           PT Long Term Goals - 10/15/16 1622      PT LONG TERM GOAL #1   Title patient to be independent with advanced HEP (12/03/16)   Status On-going     PT LONG TERM GOAL #2   Title patient to improve L shoulder AROM equal to that of R shouler with no increase in pain (12/03/16)   Status On-going     PT LONG TERM GOAL #3   Title Patient to improve L shoulder strength to >/= 4+5 with no increase in pain (12/03/16)   Status On-going     PT LONG TERM GOAL #4   Title Patient to report return to normal ADLs and work related task with pain no greater than 1/10 for greater than 2 weeks demonstrating improved functional use of L shoulder (12/03/16)   Status On-going               Plan - 10/28/16 1733    Clinical Impression Statement Pt continues to demonstrate steady gains in L shoulder AROM, PROM (gap between AROM and PROM narrowing) and strength without increased pain, but remains frustrated as she feels progress is not as fast as she would like. Pt reassured that progress as anticipated or better than typical for a frozen shoulder, with gains emphaiszed to pt. Added sleeper stretch for L shoulder IR and strengthening exercises to HEP with pt cautioned not to over-do with HEP to avoid re-irritating the shoulder.   Rehab Potential Good   PT Treatment/Interventions ADLs/Self Care Home Management;Cryotherapy;Electrical Stimulation;Iontophoresis  4mg /ml Dexamethasone;Moist Heat;Ultrasound;Therapeutic exercise;Therapeutic activities;Patient/family education;Manual techniques;Passive range of motion;Vasopneumatic Device;Taping;Dry needling   PT Next Visit Plan gentle ROM per patient tolerance progressing to strengthening as pain allows; manual therapy for joint mobs and to address muscular tightness; ionto patch as indicated and continued benefit noted   Consulted and Agree with Plan of Care Patient      Patient will benefit from skilled therapeutic intervention in order to improve the following deficits and impairments:  Decreased activity tolerance, Decreased range of motion, Decreased strength, Impaired UE functional use, Pain  Visit Diagnosis: Acute pain of left shoulder  Stiffness of left shoulder, not elsewhere classified  Abnormal posture  Muscle weakness (generalized)     Problem List Patient Active Problem List   Diagnosis Date Noted  . Pain of left patellofemoral joint 09/12/2016  . Adhesive capsulitis of left shoulder 06/30/2016  . Palpitations 04/02/2016  . Anemia 07/08/2015  . Cervical cancer screening 07/04/2015  . Dysphagia 04/05/2015  . Low back pain 11/21/2014  . Muscle spasm of back 04/11/2014  . Cryptogenic stroke ([place]) 01/27/2014  . PFO (patent foramen ovale) 01/23/2014  . Menorrhagia 03/07/2013  . Hyperlipidemia, mixed 02/08/2013  . GERD (gastroesophageal reflux disease) 02/08/2013  . Migraine 01/03/2013  . Conjunctival hemorrhage of left eye 09/23/2011  . Vitamin D deficiency 03/08/2011  . 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 Thyroid nodule 01/15/2011  . Nevus 01/15/2011  . Breast calcification, left 01/15/2011    Percival Spanish, PT, MPT 10/28/2016, 5:45 PM  [facility] [address]  [place], [place], [address] Phone: [phone]   Fax:  [phone]  Name: Melinda Kirk MRN: [id number] Date of Birth:  [birth year]

## 2016-10-31 ENCOUNTER — Ambulatory Visit: Payer: 59 | Admitting: Physical Therapy

## 2016-10-31 DIAGNOSIS — R293 Abnormal posture: Secondary | ICD-10-CM

## 2016-10-31 DIAGNOSIS — M25512 Pain in left shoulder: Secondary | ICD-10-CM | POA: Diagnosis not present

## 2016-10-31 DIAGNOSIS — M25612 Stiffness of left shoulder, not elsewhere classified: Secondary | ICD-10-CM

## 2016-10-31 DIAGNOSIS — M6281 Muscle weakness (generalized): Secondary | ICD-10-CM

## 2016-10-31 MED FILL — raNITIdine HCL 300 MG TABS: 300 | 30 days supply | Qty: 30 | Fill #1

## 2016-10-31 NOTE — Therapy (Signed)
[facility] [address]  [place], [place], [address] Phone: [phone]   Fax:  [phone]  Physical Therapy Treatment  Patient Details  Name: Melinda Kirk MRN: [id number] Date of Birth: 21-Jun-1971 Referring Provider: Silverio Decamp, MD  Encounter Date: 10/31/2016      PT End of Session - 10/31/16 1653    Visit Number 21   Number of Visits 30   Date for PT Re-Evaluation 12/03/16   PT Start Time 9798   PT Stop Time 1736   PT Time Calculation (min) 43 min   Activity Tolerance Patient tolerated treatment well   Behavior During Therapy [facility] for tasks assessed/performed      Past Medical History:  Diagnosis Date  . Anemia 07/08/2015  . Breast calcification, left 07/29/04  . Cryptogenic stroke ([place]) 2015   a. s/p PFO closure on 07/03/16  . GERD (gastroesophageal reflux disease)   . Menorrhagia 03/07/2013  . PFO (patent foramen ovale)    a. s/p PFO closure 07/03/16  . Thyroid nodule    s/p FNA- 4/09- benign  . Unspecified hypothyroidism 01/15/2011  . Vitamin D deficiency 03/08/2011    Past Surgical History:  Procedure Laterality Date  . BIOPSY THYROID  10/2007   benign biopsy of thyroid nodules.  [name] PATENT FORAMEN OVALE CLOSURE  07/03/2016  . PATENT FORAMEN OVALE CLOSURE N/A 07/03/2016   Procedure: PFO  Closure;  Surgeon: [name], MD;  Location: [place] [facility];  Service: Cardiovascular;  Laterality: N/A;  . TEE WITHOUT CARDIOVERSION N/A 02/10/2014   Procedure: TRANSESOPHAGEAL ECHOCARDIOGRAM (TEE);  Surgeon: [name], MD;  Location: [facility] ENDOSCOPY;  Service: Cardiovascular;  Laterality: N/A;    There were no vitals filed for this visit.      Subjective Assessment - 10/31/16 1656    Subjective Pt stating "tired today but not sore".   Patient Stated Goals reduce pain, improve function   Currently in Pain? No/denies                         [facility] Adult PT  Treatment/Exercise - 10/31/16 1653      Shoulder Exercises: Supine   Protraction Left;15 reps;Weights   Protraction Weight (lbs) 3   Flexion Strengthening;Left;20 reps;Weights   Shoulder Flexion Weight (lbs) 3   Flexion Limitations pause for stretch at top of motion     Shoulder Exercises: Sidelying   External Rotation Strengthening;Left;20 reps;Weights   External Rotation Weight (lbs) 3   ABduction Strengthening;Left;15 reps;Weights   ABduction Weight (lbs) 2   ABduction Limitations 20-100 dg (pain-free range)     Shoulder Exercises: Pulleys   Flexion 3 minutes   ABduction 3 minutes     Shoulder Exercises: ROM/Strengthening   UBE (Upper Arm Bike) Level 3.5 x 6 minutes (3/3)   Cybex Row Limitations 25# - 20 x 5" hold   Other ROM/Strengthening Exercises BATCA pull-down (emphasis on scap retraction with passive release for flexion AAROM) 20# - 20x5"     Manual Therapy   Manual Therapy Joint mobilization;Scapular mobilization;Soft tissue mobilization   Joint Mobilization Grade III-IV joint mobs - AP and inferior for improved ROM   Soft tissue mobilization pecs & posterior capsule - subscapularis, teres major/minor   Scapular Mobilization L scapula - all directions                  PT Short Term Goals - 10/15/16 1621  PT SHORT TERM GOAL #1   Title patient to improve L shoulder PROM equal to that of R shoulder with no increase in pain (11/05/16)   Status On-going           PT Long Term Goals - 10/15/16 1622      PT LONG TERM GOAL #1   Title patient to be independent with advanced HEP (12/03/16)   Status On-going     PT LONG TERM GOAL #2   Title patient to improve L shoulder AROM equal to that of R shouler with no increase in pain (12/03/16)   Status On-going     PT LONG TERM GOAL #3   Title Patient to improve L shoulder strength to >/= 4+5 with no increase in pain (12/03/16)   Status On-going     PT LONG TERM GOAL #4   Title Patient to report return to  normal ADLs and work related task with pain no greater than 1/10 for greater than 2 weeks demonstrating improved functional use of L shoulder (12/03/16)   Status On-going               Plan - 10/31/16 1657    Clinical Impression Statement Pt noting benefit from sleeper stretch for IR, but reporting continued tightness today that responded well to manual therapy with pt feeling like more ROM available afterward. Continued shoulder and scapular strengthening with good tolerance for exercises and no increased pain.   Rehab Potential Good   PT Treatment/Interventions ADLs/Self Care Home Management;Cryotherapy;Electrical Stimulation;Iontophoresis 4mg /ml Dexamethasone;Moist Heat;Ultrasound;Therapeutic exercise;Therapeutic activities;Patient/family education;Manual techniques;Passive range of motion;Vasopneumatic Device;Taping;Dry needling   PT Next Visit Plan gentle ROM per patient tolerance progressing to strengthening as pain allows; manual therapy for joint mobs and to address muscular tightness; ionto patch as indicated and continued benefit noted   Consulted and Agree with Plan of Care Patient      Patient will benefit from skilled therapeutic intervention in order to improve the following deficits and impairments:  Decreased activity tolerance, Decreased range of motion, Decreased strength, Impaired UE functional use, Pain  Visit Diagnosis: Acute pain of left shoulder  Stiffness of left shoulder, not elsewhere classified  Abnormal posture  Muscle weakness (generalized)     Problem List Patient Active Problem List   Diagnosis Date Noted  . Pain of left patellofemoral joint 09/12/2016  . Adhesive capsulitis of left shoulder 06/30/2016  . Palpitations 04/02/2016  . Anemia 07/08/2015  . Cervical cancer screening 07/04/2015  . Dysphagia 04/05/2015  . Low back pain 11/21/2014  . Muscle spasm of back 04/11/2014  . Cryptogenic stroke ([place]) 01/27/2014  . PFO (patent foramen ovale)  01/23/2014  . Menorrhagia 03/07/2013  . Hyperlipidemia, mixed 02/08/2013  . GERD (gastroesophageal reflux disease) 02/08/2013  . Migraine 01/03/2013  . Conjunctival hemorrhage of left eye 09/23/2011  . Vitamin D deficiency 03/08/2011  . 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 Thyroid nodule 01/15/2011  . Nevus 01/15/2011  . Breast calcification, left 01/15/2011    Percival Spanish, PT, MPT 10/31/2016, 5:43 PM  [facility] [address]  [place], [place], [address] Phone: [phone]   Fax:  [phone]  Name: Melinda Kirk MRN: [id number] Date of Birth: 1970-10-11

## 2016-11-04 ENCOUNTER — Ambulatory Visit: Payer: 59 | Admitting: Physical Therapy

## 2016-11-04 DIAGNOSIS — M25512 Pain in left shoulder: Secondary | ICD-10-CM

## 2016-11-04 DIAGNOSIS — M6281 Muscle weakness (generalized): Secondary | ICD-10-CM

## 2016-11-04 DIAGNOSIS — M25612 Stiffness of left shoulder, not elsewhere classified: Secondary | ICD-10-CM

## 2016-11-04 DIAGNOSIS — R293 Abnormal posture: Secondary | ICD-10-CM | POA: Diagnosis not present

## 2016-11-04 NOTE — Therapy (Addendum)
[facility] [address]  [place], [place], [address] Phone: [phone]   Fax:  [phone]  Physical Therapy Treatment  Patient Details  Name: Melinda Kirk MRN: [id number] Date of Birth: 05/30/1971 Referring Provider: Silverio Decamp  Encounter Date: 11/04/2016      PT End of Session - 11/04/16 1619    Visit Number 22   Number of Visits 30   Date for PT Re-Evaluation 12/03/16   PT Start Time 1856   PT Stop Time 1700   PT Time Calculation (min) 43 min   Activity Tolerance Patient tolerated treatment well   Behavior During Therapy [facility] for tasks assessed/performed      Past Medical History:  Diagnosis Date  . Anemia 07/08/2015  . Breast calcification, left 07/29/04  . 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 s/p PFO closure on 07/03/16  . GERD (gastroesophageal reflux disease)   . Menorrhagia 03/07/2013  . PFO (patent foramen ovale)    a. s/p PFO closure 07/03/16  . Thyroid nodule    s/p FNA- 4/09- benign  . Unspecified hypothyroidism 01/15/2011  . Vitamin D deficiency 03/08/2011    Past Surgical History:  Procedure Laterality Date  . BIOPSY THYROID  10/2007   benign biopsy of thyroid nodules.  [name] PATENT FORAMEN OVALE CLOSURE  07/03/2016  . PATENT FORAMEN OVALE CLOSURE N/A 07/03/2016   Procedure: PFO  Closure;  Surgeon: [name], MD;  Location: [place] [facility];  Service: Cardiovascular;  Laterality: N/A;  . TEE WITHOUT CARDIOVERSION N/A 02/10/2014   Procedure: TRANSESOPHAGEAL ECHOCARDIOGRAM (TEE);  Surgeon: [name], MD;  Location: [facility] ENDOSCOPY;  Service: Cardiovascular;  Laterality: N/A;    There were no vitals filed for this visit.      Subjective Assessment - 11/04/16 1618    Subjective Tried helping lift couch yesterday - some throbbing pain   Patient Stated Goals reduce pain, improve function   Currently in Pain? No/denies   Pain Score 0-No pain            [facility] PT Assessment  - 11/04/16 1649      Assessment   Medical Diagnosis L shoulder pain   Referring Provider Silverio Decamp   Next MD Visit 11/07/16     AROM   Left Shoulder Flexion 149 Degrees   Left Shoulder ABduction 148 Degrees   Left Shoulder Internal Rotation --  FIR to approx T10-T12   Left Shoulder External Rotation --  FER to approx C7/T1     PROM   Left Shoulder Flexion 154 Degrees   Left Shoulder ABduction 158 Degrees   Left Shoulder Internal Rotation 80 Degrees   Left Shoulder External Rotation 78 Degrees                     [facility] Adult PT Treatment/Exercise - 11/04/16 1620      Shoulder Exercises: Standing   External Rotation Strengthening;Left;15 reps;Theraband   Theraband Level (Shoulder External Rotation) Level 2 (Red)   Internal Rotation Strengthening;Left;15 reps;Theraband   Theraband Level (Shoulder Internal Rotation) Level 2 (Red)   Flexion Strengthening;Left;15 reps;Weights   Shoulder Flexion Weight (lbs) 2#   Flexion Limitations walking ball up wall with stretch   ABduction Strengthening;Left;10 reps;Weights   ABduction Limitations AAROM with wall ladder - hold and eccentric lowering - 2# at wrist   Extension Strengthening;Both;15 reps;Theraband   Theraband Level (Shoulder Extension) Level 2 (Red)   Row Strengthening;Both;15 reps;Theraband  Theraband Level (Shoulder Row) Level 2 (Red)   Other Standing Exercises AAROM shoulder flexion with hold and eccentric lowering - 2# at wrist x 10 reps     Shoulder Exercises: Pulleys   Flexion 3 minutes   ABduction 3 minutes     Shoulder Exercises: ROM/Strengthening   UBE (Upper Arm Bike) Level 3.5 x 4 minutes (2/2)                  PT Short Term Goals - 10/15/16 1621      PT SHORT TERM GOAL #1   Title patient to improve L shoulder PROM equal to that of R shoulder with no increase in pain (11/05/16)   Status On-going           PT Long Term Goals - 10/15/16 1622      PT LONG TERM GOAL #1    Title patient to be independent with advanced HEP (12/03/16)   Status On-going     PT LONG TERM GOAL #2   Title patient to improve L shoulder AROM equal to that of R shouler with no increase in pain (12/03/16)   Status On-going     PT LONG TERM GOAL #3   Title Patient to improve L shoulder strength to >/= 4+5 with no increase in pain (12/03/16)   Status On-going     PT LONG TERM GOAL #4   Title Patient to report return to normal ADLs and work related task with pain no greater than 1/10 for greater than 2 weeks demonstrating improved functional use of L shoulder (12/03/16)   Status On-going               Plan - 11/04/16 1619    Clinical Impression Statement Patient doing very well today - good improvements in L shoulder ROM and strength with ability to begin incorporating more overhead work today. Some pain continues at end ranges of available ROM, both passively and actively. Patient reports good improvements of shoulder IR with use of sleeper stretch. Patient to continue to benefit from PT to maximize functional use of L shoulder.    PT Treatment/Interventions ADLs/Self Care Home Management;Cryotherapy;Electrical Stimulation;Iontophoresis 70m/ml Dexamethasone;Moist Heat;Ultrasound;Therapeutic exercise;Therapeutic activities;Patient/family education;Manual techniques;Passive range of motion;Vasopneumatic Device;Taping;Dry needling   PT Next Visit Plan gentle ROM per patient tolerance progressing to strengthening as pain allows; manual therapy for joint mobs and to address muscular tightness; ionto patch as indicated and continued benefit noted   Consulted and Agree with Plan of Care Patient      Patient will benefit from skilled therapeutic intervention in order to improve the following deficits and impairments:  Decreased activity tolerance, Decreased range of motion, Decreased strength, Impaired UE functional use, Pain  Visit Diagnosis: Acute pain of left shoulder  Stiffness of left  shoulder, not elsewhere classified  Abnormal posture  Muscle weakness (generalized)     Problem List Patient Active Problem List   Diagnosis Date Noted  . Pain of left patellofemoral joint 09/12/2016  . Adhesive capsulitis of left shoulder 06/30/2016  . Palpitations 04/02/2016  . Anemia 07/08/2015  . Cervical cancer screening 07/04/2015  . Dysphagia 04/05/2015  . Low back pain 11/21/2014  . Muscle spasm of back 04/11/2014  . Cryptogenic stroke ([place] 01/27/2014  . PFO (patent foramen ovale) 01/23/2014  . Menorrhagia 03/07/2013  . Hyperlipidemia, mixed 02/08/2013  . GERD (gastroesophageal reflux disease) 02/08/2013  . Migraine 01/03/2013  . Conjunctival hemorrhage of left eye 09/23/2011  . Vitamin D deficiency 03/08/2011  .  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 Thyroid nodule 01/15/2011  . Nevus 01/15/2011  . Breast calcification, left 01/15/2011     Lanney Gins, PT, DPT 11/04/16 6:08 PM   PHYSICAL THERAPY DISCHARGE SUMMARY  Visits from Start of Care: 22  Current functional level related to goals / functional outcomes: See above   Remaining deficits: See above; ROM, strength, full function   Education / Equipment: HEP  Plan: Patient agrees to discharge.  Patient goals were not met. Patient is being discharged due to the physician's request.  ?????    MD allowing patient to be release from PT due to improvements in function. Will gladly see patient in the future for any other PT needs.   Lanney Gins, PT, DPT 12/10/16 8:26 AM    [facility] [address]  [place], [place], [address] Phone: [phone]   Fax:  [phone]  Name: Melinda Kirk MRN: [id number] Date of Birth: [birth year]

## 2016-11-07 ENCOUNTER — Ambulatory Visit: Payer: 59 | Admitting: Physical Therapy

## 2016-11-07 ENCOUNTER — Ambulatory Visit (INDEPENDENT_AMBULATORY_CARE_PROVIDER_SITE_OTHER): Payer: 59 | Admitting: Sports Medicine

## 2016-11-07 ENCOUNTER — Encounter: Payer: Self-pay | Admitting: Sports Medicine

## 2016-11-07 DIAGNOSIS — M7502 Adhesive capsulitis of left shoulder: Secondary | ICD-10-CM

## 2016-11-07 NOTE — Assessment & Plan Note (Signed)
Range of motion is essentially full bilaterally, no pain, she can transition to a home physical therapy regimen. She can return to work with full duty without restrictions. I did caution her to be somewhat cognizant regarding heavy lifting.

## 2016-11-07 NOTE — Progress Notes (Signed)
  Subjective:    CC: Follow-up  HPI: It's been months of physical therapy, injection, her adhesive capsulitis is now resolved. Rate to return to work with full duty.  Past medical history:  Negative.  See flowsheet/record as well for more information.  Surgical history: Negative.  See flowsheet/record as well for more information.  Family history: Negative.  See flowsheet/record as well for more information.  Social history: Negative.  See flowsheet/record as well for more information.  Allergies, and medications have been entered into the medical record, reviewed, and no changes needed.   Review of Systems: No fevers, chills, night sweats, weight loss, chest pain, or shortness of breath.   Objective:    General: Well Developed, well nourished, and in no acute distress.  Neuro: Alert and oriented x3, extra-ocular muscles intact, sensation grossly intact.  HEENT: Normocephalic, atraumatic, pupils equal round reactive to light, neck supple, no masses, no lymphadenopathy, thyroid nonpalpable.  Skin: Warm and dry, no rashes. Cardiac: Regular rate and rhythm, no murmurs rubs or gallops, no lower extremity edema.  Respiratory: Clear to auscultation bilaterally. Not using accessory muscles, speaking in full sentences. Left Shoulder: Inspection reveals no abnormalities, atrophy or asymmetry. Palpation is normal with no tenderness over AC joint or bicipital groove. ROM is full in all planes, possibly lacks a few degrees of full forward flexion and external rotation. Rotator cuff strength normal throughout. No signs of impingement with negative Neer and Hawkin's tests, empty can. Speeds and Yergason's tests normal. No labral pathology noted with negative Obrien's, negative crank, negative clunk, and good stability. Normal scapular function observed. No painful arc and no drop arm sign. No apprehension sign  Impression and Recommendations:    Adhesive capsulitis of left shoulder Range of  motion is essentially full bilaterally, no pain, she can transition to a home physical therapy regimen. She can return to work with full duty without restrictions. I did caution her to be somewhat cognizant regarding heavy lifting.

## 2017-01-02 ENCOUNTER — Telehealth: Payer: Self-pay | Admitting: Cardiovascular Disease

## 2017-01-02 NOTE — Telephone Encounter (Signed)
New Message  Pt c/o medication issue:  1. Name of Medication: Plavix   2. How are you currently taking this medication (dosage and times per day)? 75mg    3. Are you having a reaction (difficulty breathing--STAT)? No   4. What is your medication issue? Per pt would know to know how long she will need to continue tasking this medication. Please call back to discuss

## 2017-01-03 NOTE — Telephone Encounter (Signed)
I spoke with the Melinda Kirk and made her aware of instructions from Dr Burt Knack.

## 2017-01-03 NOTE — Telephone Encounter (Signed)
Left message on machine for pt to contact the office.   Discussed with Dr Burt Knack and the pt can STOP Plavix but needs to continue ASA 81mg  daily.  Pt should keep pending appointments in July.

## 2017-01-03 NOTE — Telephone Encounter (Signed)
New message   Pt is calling to follow up on this

## 2017-01-27 ENCOUNTER — Other Ambulatory Visit: Payer: Self-pay

## 2017-01-27 ENCOUNTER — Ambulatory Visit (HOSPITAL_COMMUNITY): Payer: 59 | Attending: Internal Medicine

## 2017-01-27 DIAGNOSIS — Q2112 Patent foramen ovale: Secondary | ICD-10-CM

## 2017-01-27 DIAGNOSIS — Q211 Atrial septal defect: Secondary | ICD-10-CM | POA: Insufficient documentation

## 2017-02-05 ENCOUNTER — Encounter: Payer: Self-pay | Admitting: Cardiovascular Disease

## 2017-02-05 ENCOUNTER — Ambulatory Visit (INDEPENDENT_AMBULATORY_CARE_PROVIDER_SITE_OTHER): Payer: 59 | Admitting: Cardiovascular Disease

## 2017-02-05 ENCOUNTER — Encounter (INDEPENDENT_AMBULATORY_CARE_PROVIDER_SITE_OTHER): Payer: Self-pay

## 2017-02-05 VITALS — BP 116/72 | HR 83 | Ht 64.5 in | Wt 135.0 lb

## 2017-02-05 DIAGNOSIS — Q211 Atrial septal defect: Secondary | ICD-10-CM | POA: Diagnosis not present

## 2017-02-05 DIAGNOSIS — Q2112 Patent foramen ovale: Secondary | ICD-10-CM

## 2017-02-05 NOTE — Progress Notes (Signed)
Cardiology Office Note Date:  02/05/2017   ID:  Lylliana, Kitamura 07-23-1971, MRN [id number]  PCP:  Mosie Lukes, MD  Cardiologist:  [name], MD    Chief Complaint  Patient presents with  . Follow-up    PFO Closure     History of Present Illness: YAZMYNE SARA is a 46 y.o. female who presents for follow-up after undergoing transcatheter PFO closure in December 2017. She had a history of cryptogenic stroke in 2015. She underwent uncomplicated transcatheter PFO closure with a 25 mm Amplatzer PFO occluder. A follow-up echo was recently done demonstrating no residual shunting by color flow or bubble study. There was no pericardial effusion. The patient feels well. She denies chest pain, chest pressure, or shortness of breath. No heart palpitations, lightheadedness, or syncope.  Past Medical History:  Diagnosis Date  . Anemia 07/08/2015  . Breast calcification, left 07/29/04  . Cryptogenic stroke ([place]) 2015   a. s/p PFO closure on 07/03/16  . GERD (gastroesophageal reflux disease)   . Menorrhagia 03/07/2013  . PFO (patent foramen ovale)    a. s/p PFO closure 07/03/16  . Thyroid nodule    s/p FNA- 4/09- benign  . Unspecified hypothyroidism 01/15/2011  . Vitamin D deficiency 03/08/2011    Past Surgical History:  Procedure Laterality Date  . BIOPSY THYROID  10/2007   benign biopsy of thyroid nodules.  [name] PATENT FORAMEN OVALE CLOSURE  07/03/2016  . PATENT FORAMEN OVALE CLOSURE N/A 07/03/2016   Procedure: PFO  Closure;  Surgeon: [name], MD;  Location: [facility];  Service: Cardiovascular;  Laterality: N/A;  . TEE WITHOUT CARDIOVERSION N/A 02/10/2014   Procedure: TRANSESOPHAGEAL ECHOCARDIOGRAM (TEE);  Surgeon: [name], MD;  Location: [facility] ENDOSCOPY;  Service: Cardiovascular;  Laterality: N/A;    Current Outpatient Prescriptions  Medication Sig Dispense Refill  . ALPRAZolam (XANAX) 0.25 MG tablet Take 1 tablet (0.25 mg total) by mouth 3 (three) times daily  as needed for anxiety or sleep. 30 tablet 1  . aspirin EC 81 MG tablet Take 81 mg by mouth daily.    . B Complex-Biotin-FA (VITAMIN B50 COMPLEX PO) Take 1 tablet by mouth daily.    . Calcium Carbonate-Vitamin D (CALTRATE 600+D PO) Take 1 tablet by mouth daily.    . Cholecalciferol 2000 UNITS CAPS Take 2,000 Units by mouth daily.     . ferrous sulfate 325 (65 FE) MG tablet Take 325 mg by mouth daily with breakfast.    . hyoscyamine (LEVSIN SL) 0.125 MG SL tablet Place 0.125 mg under the tongue every 4 (four) hours as needed (acid reflux).    . Multiple Vitamins-Minerals (MULTIVITAMIN WITH MINERALS) tablet Take 1 tablet by mouth daily.    . naproxen sodium (ALEVE) 220 MG tablet Take 440 mg by mouth daily as needed (headache).    . Omega-3 Fatty Acids (EQL OMEGA 3 FISH OIL) 1400 MG CAPS Take 1,400 mg by mouth daily.     . Probiotic Product (PROBIOTIC ADVANCED) CAPS Take 1 capsule by mouth daily.    . ranitidine (ZANTAC) 300 MG tablet TAKE 1 TABLET (300 MG TOTAL) BY MOUTH AT BEDTIME. 30 tablet 1  . RESTASIS 0.05 % ophthalmic emulsion Place 1 drop into both eyes 2 (two) times daily.  4  . Turmeric 450 MG CAPS Take 450 mg by mouth daily.     . vitamin C (ASCORBIC ACID) 500 MG tablet Take 500 mg by mouth daily.     No  current facility-administered medications for this visit.     Allergies:   Patient has no known allergies.   Social History:  The patient  reports that she quit smoking about 20 years ago. Her smoking use included Cigarettes. She has a 1.25 pack-year smoking history. She has never used smokeless tobacco. She reports that she drinks alcohol. She reports that she does not use drugs.   Family History:  The patient's family history includes Arthritis in her mother; Heart disease in her father; Stroke in her mother.    ROS:  Please see the history of present illness.  All other systems are reviewed and negative.    PHYSICAL EXAM: VS:  BP 116/72   Pulse 83   Ht 5' 4.5" (1.638 m)    Wt 135 lb (61.2 kg)   BMI 22.81 kg/m  , BMI Body mass index is 22.81 kg/m. GEN: Well nourished, well developed, in no acute distress  HEENT: normal  Neck: no JVD, no masses. No carotid bruits Cardiac: RRR without murmur or gallop                Respiratory:  clear to auscultation bilaterally, normal work of breathing GI: soft, nontender, nondistended, + BS MS: no deformity or atrophy  Ext: no pretibial edema, pedal pulses 2+= bilaterally Skin: warm and dry, no rash Neuro:  Strength and sensation are intact Psych: euthymic mood, full affect  EKG:  EKG is ordered today. The ekg ordered today shows normal sinus rhythm 83 bpm, within normal limits.  Recent Labs: 03/13/2016: TSH 0.89 04/02/2016: ALT 14 07/03/2016: BUN 9; Creatinine, Ser 0.47; Hemoglobin 13.0; Platelets 188; Potassium 4.0; Sodium 139   Lipid Panel     Component Value Date/Time   CHOL 186 01/26/2015 1356   TRIG 77.0 01/26/2015 1356   HDL 60.00 01/26/2015 1356   CHOLHDL 3 01/26/2015 1356   VLDL 15.4 01/26/2015 1356   LDLCALC 111 (H) 01/26/2015 1356      Wt Readings from Last 3 Encounters:  02/05/17 135 lb (61.2 kg)  11/07/16 134 lb 4.8 oz (60.9 kg)  10/10/16 132 lb (59.9 kg)     Cardiac Studies Reviewed: 2D Echo: Study Conclusions  - Left ventricle: The cavity size was normal. Wall thickness was   normal. Systolic function was normal. The estimated ejection   fraction was in the range of 60% to 65%. Wall motion was normal;   there were no regional wall motion abnormalities. Left   ventricular diastolic function parameters were normal. - Atrial septum: Amplatzer PFO closure device in place. No evidence   for interatrial shunting by saline microbubble contrast. No   defect or patent foramen ovale was identified.  Impressions:  - Stable amplatzer PFO closure device. No interatrial shunting at   rest or with valsalva using saline microbubble contrast.   Otherwise, normal echo.  ASSESSMENT AND  PLAN: PFO status post transcatheter closure: The patient's echo was reviewed and shows appropriate position of her PFO occluder device with no residual right to left shunting. She has discontinued clopidogrel. She is out beyond 6 months and no longer needs SBE prophylaxis. She will continue on aspirin 81 mg daily. I would be happy to see her back in the future if any problems arise.  Current medicines are reviewed with the patient today.  The patient does not have concerns regarding medicines.  Labs/ tests ordered today include:   Orders Placed This Encounter  Procedures  . EKG 12-Lead    Disposition:  FU prn  Signed, [name], MD  02/05/2017 9:27 AM    [facility] [place], [place], [place]  [address] Phone: [phone]; Fax: [phone]

## 2017-02-05 NOTE — Patient Instructions (Signed)
Medication Instructions:  Your physician recommends that you continue on your current medications as directed. Please refer to the Current Medication list given to you today.  SBE is no longer required at this time. Amoxicillin removed from medication list.   Labwork: No new orders.   Testing/Procedures: No new orders.   Follow-Up: Your physician recommends that you schedule a follow-up appointment as needed with Dr Burt Knack.    Any Other Special Instructions Will Be Listed Below (If Applicable).     If you need a refill on your cardiac medications before your next appointment, please call your pharmacy.

## 2017-05-20 ENCOUNTER — Ambulatory Visit (INDEPENDENT_AMBULATORY_CARE_PROVIDER_SITE_OTHER): Payer: 59 | Admitting: Family Medicine

## 2017-05-20 DIAGNOSIS — Z79899 Other long term (current) drug therapy: Secondary | ICD-10-CM

## 2017-05-20 DIAGNOSIS — G8929 Other chronic pain: Secondary | ICD-10-CM | POA: Diagnosis not present

## 2017-05-20 DIAGNOSIS — R002 Palpitations: Secondary | ICD-10-CM | POA: Diagnosis not present

## 2017-05-20 DIAGNOSIS — M545 Low back pain, unspecified: Secondary | ICD-10-CM

## 2017-05-20 DIAGNOSIS — F418 Other specified anxiety disorders: Secondary | ICD-10-CM

## 2017-05-20 DIAGNOSIS — E782 Mixed hyperlipidemia: Secondary | ICD-10-CM

## 2017-05-20 DIAGNOSIS — E559 Vitamin D deficiency, unspecified: Secondary | ICD-10-CM

## 2017-05-20 DIAGNOSIS — D649 Anemia, unspecified: Secondary | ICD-10-CM

## 2017-05-20 MED ORDER — ALPRAZOLAM 0.25 MG PO TABS: 0.2500 mg | ORAL_TABLET | Freq: Three times a day (TID) | ORAL | 1 refills | Status: DC | PRN

## 2017-05-20 NOTE — Patient Instructions (Addendum)
iCool Eat small, frequent meals with lean proteins and small meals with minimal carbs, hydrate well, exercise  Menopause Menopause is the normal time of life when menstrual periods stop completely. Menopause is complete when you have missed 12 consecutive menstrual periods. It usually occurs between the ages of 38 years and 27 years. Very rarely does a woman develop menopause before the age of 76 years. At menopause, your ovaries stop producing the female hormones estrogen and progesterone. This can cause undesirable symptoms and also affect your health. Sometimes the symptoms may occur 4-5 years before the menopause begins. There is no relationship between menopause and:  Oral contraceptives.  Number of children you had.  Race.  The age your menstrual periods started (menarche).  Heavy smokers and very thin women may develop menopause earlier in life. What are the causes?  The ovaries stop producing the female hormones estrogen and progesterone. Other causes include:  Surgery to remove both ovaries.  The ovaries stop functioning for no known reason.  Tumors of the pituitary gland in the brain.  Medical disease that affects the ovaries and hormone production.  Radiation treatment to the abdomen or pelvis.  Chemotherapy that affects the ovaries.  What are the signs or symptoms?  Hot flashes.  Night sweats.  Decrease in sex drive.  Vaginal dryness and thinning of the vagina causing painful intercourse.  Dryness of the skin and developing wrinkles.  Headaches.  Tiredness.  Irritability.  Memory problems.  Weight gain.  Bladder infections.  Hair growth of the face and chest.  Infertility. More serious symptoms include:  Loss of bone (osteoporosis) causing breaks (fractures).  Depression.  Hardening and narrowing of the arteries (atherosclerosis) causing heart attacks and strokes.  How is this diagnosed?  When the menstrual periods have stopped for 12  straight months.  Physical exam.  Hormone studies of the blood. How is this treated? There are many treatment choices and nearly as many questions about them. The decisions to treat or not to treat menopausal changes is an individual choice made with your health care provider. Your health care provider can discuss the treatments with you. Together, you can decide which treatment will work best for you. Your treatment choices may include:  Hormone therapy (estrogen and progesterone).  Non-hormonal medicines.  Treating the individual symptoms with medicine (for example antidepressants for depression).  Herbal medicines that may help specific symptoms.  Counseling by a psychiatrist or psychologist.  Group therapy.  Lifestyle changes including: ? Eating healthy. ? Regular exercise. ? Limiting caffeine and alcohol. ? Stress management and meditation.  No treatment.  Follow these instructions at home:  Take the medicine your health care provider gives you as directed.  Get plenty of sleep and rest.  Exercise regularly.  Eat a diet that contains calcium (good for the bones) and soy products (acts like estrogen hormone).  Avoid alcoholic beverages.  Do not smoke.  If you have hot flashes, dress in layers.  Take supplements, calcium, and vitamin D to strengthen bones.  You can use over-the-counter lubricants or moisturizers for vaginal dryness.  Group therapy is sometimes very helpful.  Acupuncture may be helpful in some cases. Contact a health care provider if:  You are not sure you are in menopause.  You are having menopausal symptoms and need advice and treatment.  You are still having menstrual periods after age 46 years.  You have pain with intercourse.  Menopause is complete (no menstrual period for 12 months) and you develop vaginal  bleeding.  You need a referral to a specialist (gynecologist, psychiatrist, or psychologist) for treatment. Get help right  away if:  You have severe depression.  You have excessive vaginal bleeding.  You fell and think you have a broken bone.  You have pain when you urinate.  You develop leg or chest pain.  You have a fast pounding heart beat (palpitations).  You have severe headaches.  You develop vision problems.  You feel a lump in your breast.  You have abdominal pain or severe indigestion. This information is not intended to replace advice given to you by your health care provider. Make sure you discuss any questions you have with your health care provider. Document Released: 10/05/2003 Document Revised: 12/21/2015 Document Reviewed: 02/11/2013 Elsevier Interactive Patient Education  2017 [employer].

## 2017-05-20 NOTE — Progress Notes (Signed)
Subjective:  I acted as a Education administrator for Dr. Charlett Blake. Melinda Kirk, Melinda Kirk  Patient ID: Melinda Kirk, female    DOB: 1970/11/27, 46 y.o.   MRN: [id number]  No chief complaint on file.   HPI  Patient is in today for a follow up. She has just changed jobs and now works at the [employer] She likes the work but the change is always stressful. She has also just separated from her husband and is living in an apartment with her children. She has a great deal of stress, her cycles have become slightly more irregular and she is having 4-5 hot flashes daily. She has episodes of anxiety with palpitations, sob, racing thoughts a times. Is having trouble sleeping, alprazolam helps but she takes it infrequently. Denies congestion/fevers/GI or GU c/o. Taking meds as prescribed  Patient Care Team: Mosie Lukes, MD as PCP - General (Family Medicine)   Past Medical History:  Diagnosis Date  . Anemia 07/08/2015  . Breast calcification, left 07/29/04  . Cryptogenic stroke ([place]) 2015   a. s/p PFO closure on 07/03/16  . Depression with anxiety 05/21/2017  . GERD (gastroesophageal reflux disease)   . Menorrhagia 03/07/2013  . PFO (patent foramen ovale)    a. s/p PFO closure 07/03/16  . Thyroid nodule    s/p FNA- 4/09- benign  . Unspecified hypothyroidism 01/15/2011  . Vitamin D deficiency 03/08/2011    Past Surgical History:  Procedure Laterality Date  . BIOPSY THYROID  10/2007   benign biopsy of thyroid nodules.  [name] PATENT FORAMEN OVALE CLOSURE  07/03/2016  . PATENT FORAMEN OVALE CLOSURE N/A 07/03/2016   Procedure: PFO  Closure;  Surgeon: [name], MD;  Location: [facility];  Service: Cardiovascular;  Laterality: N/A;  . TEE WITHOUT CARDIOVERSION N/A 02/10/2014   Procedure: TRANSESOPHAGEAL ECHOCARDIOGRAM (TEE);  Surgeon: [name], MD;  Location: [facility] ENDOS[facility];  Service: Cardiovascular;  Laterality: N/A;    Family History  Problem Relation Age of Onset  . Stroke Mother   . Arthritis  Mother   . Heart disease Father        CHF?    Social History   Social History  . Marital status: Married    Spouse name: N/A  . Number of children: 3  . Years of education: N/A   Occupational History  . Nurse    Social History Main Topics  . Smoking status: Former Smoker    Packs/day: 0.25    Years: 5.00    Types: Cigarettes    Quit date: 1998  . Smokeless tobacco: Never Used  . Alcohol use Yes     Comment: 07/03/2016 "might have a few drinks/year"  . Drug use: No  . Sexual activity: Yes   Other Topics Concern  . Not on file   Social History Narrative   Regular exercise:  No   Caffeine: 1-2 cups coffee / tea daily.   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    04/05/2015 last update       Outpatient Medications Prior to Visit  Medication Sig Dispense Refill  . aspirin EC 81 MG tablet Take 81 mg by mouth daily.    . B Complex-Biotin-FA (VITAMIN B50 COMPLEX PO) Take 1 tablet by mouth daily.    . Calcium Carbonate-Vitamin D (CALTRATE 600+D PO) Take 1 tablet by mouth daily.    . Cholecalciferol 2000 UNITS CAPS Take  2,000 Units by mouth daily.     . ferrous sulfate 325 (65 FE) MG tablet Take 325 mg by mouth daily with breakfast.    . hyoscyamine (LEVSIN SL) 0.125 MG SL tablet Place 0.125 mg under the tongue every 4 (four) hours as needed (acid reflux).    . Multiple Vitamins-Minerals (MULTIVITAMIN WITH MINERALS) tablet Take 1 tablet by mouth daily.    . naproxen sodium (ALEVE) 220 MG tablet Take 440 mg by mouth daily as needed (headache).    . Omega-3 Fatty Acids (EQL OMEGA 3 FISH OIL) 1400 MG CAPS Take 1,400 mg by mouth daily.     . Probiotic Product (PROBIOTIC ADVANCED) CAPS Take 1 capsule by mouth daily.    . ranitidine (ZANTAC) 300 MG tablet TAKE 1 TABLET (300 MG TOTAL) BY MOUTH AT BEDTIME. 30 tablet 1  . RESTASIS 0.05 % ophthalmic emulsion Place 1 drop into both eyes 2 (two) times daily.  4  . Turmeric 450 MG CAPS Take  450 mg by mouth daily.     . vitamin C (ASCORBIC ACID) 500 MG tablet Take 500 mg by mouth daily.    [name] ALPRAZolam (XANAX) 0.25 MG tablet Take 1 tablet (0.25 mg total) by mouth 3 (three) times daily as needed for anxiety or sleep. 30 tablet 1   No facility-administered medications prior to visit.     No Known Allergies  Review of Systems  Constitutional: Positive for malaise/fatigue. Negative for fever.  HENT: Negative for congestion.   Eyes: Negative for blurred vision.  Respiratory: Negative for shortness of breath.   Cardiovascular: Negative for chest pain, palpitations and leg swelling.  Gastrointestinal: Negative for abdominal pain, blood in stool and nausea.  Genitourinary: Negative for dysuria and frequency.  Musculoskeletal: Positive for back pain. Negative for falls.  Skin: Negative for rash.  Neurological: Negative for dizziness, loss of consciousness and headaches.  Endo/Heme/Allergies: Negative for environmental allergies.  Psychiatric/Behavioral: Positive for depression. The patient is nervous/anxious and has insomnia.        Objective:    Physical Exam  Constitutional: She is oriented to person, place, and time. She appears well-developed and well-nourished. No distress.  HENT:  Head: Normocephalic and atraumatic.  Nose: Nose normal.  Eyes: Right eye exhibits no discharge. Left eye exhibits no discharge.  Neck: Normal range of motion. Neck supple.  Cardiovascular: Normal rate and regular rhythm.   No murmur heard. Pulmonary/Chest: Effort normal and breath sounds normal.  Abdominal: Soft. Bowel sounds are normal. There is no tenderness.  Musculoskeletal: She exhibits no edema.  Neurological: She is alert and oriented to person, place, and time.  Skin: Skin is warm and dry.  Psychiatric: She has a normal mood and affect.  Nursing note and vitals reviewed.   There were no vitals taken for this visit. Wt Readings from Last 3 Encounters:  02/05/17 135 lb (61.2  kg)  11/07/16 134 lb 4.8 oz (60.9 kg)  10/10/16 132 lb (59.9 kg)   BP Readings from Last 3 Encounters:  02/05/17 116/72  11/07/16 117/76  10/10/16 121/76     Immunization History  Administered Date(s) Administered  . Influenza-Unspecified 04/28/2013, 04/21/2015, 05/15/2016  . Td 09/27/2010  . Varicella 07/24/2006    Health Maintenance  Topic Date Due  . HIV Screening  12/11/1984  . MAMMOGRAM  05/24/2016  . INFLUENZA VACCINE  02/26/2017  . PAP SMEAR  07/03/2018  . TETANUS/TDAP  09/26/2020    Lab Results  Component Value Date   WBC 6.2  07/03/2016   HGB 13.0 07/03/2016   HCT 39.2 07/03/2016   PLT 188 07/03/2016   GLUCOSE 83 07/03/2016   CHOL 186 01/26/2015   TRIG 77.0 01/26/2015   HDL 60.00 01/26/2015   LDLCALC 111 (H) 01/26/2015   ALT 14 04/02/2016   AST 17 04/02/2016   NA 139 07/03/2016   K 4.0 07/03/2016   CL 106 07/03/2016   CREATININE 0.47 07/03/2016   BUN 9 07/03/2016   CO2 28 07/03/2016   TSH 0.89 03/13/2016   INR 0.98 07/03/2016    Lab Results  Component Value Date   TSH 0.89 03/13/2016   Lab Results  Component Value Date   WBC 6.2 07/03/2016   HGB 13.0 07/03/2016   HCT 39.2 07/03/2016   MCV 89.9 07/03/2016   PLT 188 07/03/2016   Lab Results  Component Value Date   NA 139 07/03/2016   K 4.0 07/03/2016   CO2 28 07/03/2016   GLUCOSE 83 07/03/2016   BUN 9 07/03/2016   CREATININE 0.47 07/03/2016   BILITOT 0.5 04/02/2016   ALKPHOS 52 04/02/2016   AST 17 04/02/2016   ALT 14 04/02/2016   PROT 7.6 04/02/2016   ALBUMIN 4.5 04/02/2016   CALCIUM 9.5 07/03/2016   ANIONGAP 5 07/03/2016   GFR 100.58 04/02/2016   Lab Results  Component Value Date   CHOL 186 01/26/2015   Lab Results  Component Value Date   HDL 60.00 01/26/2015   Lab Results  Component Value Date   LDLCALC 111 (H) 01/26/2015   Lab Results  Component Value Date   TRIG 77.0 01/26/2015   Lab Results  Component Value Date   CHOLHDL 3 01/26/2015   No results found for:  HGBA1C       Assessment & Plan:   Problem List Items Addressed This Visit    Vitamin D deficiency    Take daily supplements and recheck levels      Relevant Orders   VITAMIN D 25 Hydroxy (Vit-D Deficiency, Fractures)   Hyperlipidemia, mixed    Encouraged heart healthy diet, increase exercise, avoid trans fats, consider a krill oil cap daily      Relevant Orders   Lipid panel   Low back pain    Encouraged moist heat and gentle stretching as tolerated. May try NSAIDs and prescription meds as directed and report if symptoms worsen or seek immediate care      Anemia   Relevant Orders   CBC   Comprehensive metabolic panel   TSH   Palpitations    No recent flares.       Relevant Orders   CBC   Comprehensive metabolic panel   TSH   Depression with anxiety    Is under a great deal of stress, she has just switched jobs and has separated from her husband. She is considering a daily SSRI and will let us know if she is ready to proceed. For now she takes Alprazolam very infrequently with goods results and UDS and contract are obtained today with refill given      Relevant Medications   ALPRAZolam (XANAX) 0.25 MG tablet    Other Visit Diagnoses    High risk medication use    -  Primary   Relevant Orders   Pain Mgmt, Profile 8 w/Conf, U      I am having Melinda Kirk maintain her Calcium Carbonate-Vitamin D (CALTRATE 600+D PO), aspirin EC, ferrous sulfate, vitamin C, EQL OMEGA 3 FISH OIL, naproxen sodium, multivitamin with  minerals, B Complex-Biotin-FA (VITAMIN B50 COMPLEX PO), PROBIOTIC ADVANCED, Turmeric, Cholecalciferol, hyoscyamine, RESTASIS, ranitidine, and ALPRAZolam.  Meds ordered this encounter  Medications  . ALPRAZolam (XANAX) 0.25 MG tablet    Sig: Take 1 tablet (0.25 mg total) by mouth 3 (three) times daily as needed for anxiety or sleep.    Dispense:  30 tablet    Refill:  1    CMA served as scribe during this visit. History, Physical and Plan performed by  medical provider. Documentation and orders reviewed and attested to.  [name], MD

## 2017-05-21 ENCOUNTER — Encounter: Payer: Self-pay | Admitting: Family Medicine

## 2017-05-21 DIAGNOSIS — F418 Other specified anxiety disorders: Secondary | ICD-10-CM

## 2017-05-21 HISTORY — DX: Other specified anxiety disorders: F41.8

## 2017-05-21 NOTE — Assessment & Plan Note (Signed)
Is under a great deal of stress, she has just switched jobs and has separated from her husband. She is considering a daily SSRI and will let us know if she is ready to proceed. For now she takes Alprazolam very infrequently with goods results and UDS and contract are obtained today with refill given

## 2017-05-21 NOTE — Assessment & Plan Note (Signed)
No recent flares 

## 2017-05-21 NOTE — Assessment & Plan Note (Signed)
Take daily supplements and recheck levels

## 2017-05-21 NOTE — Assessment & Plan Note (Signed)
Encouraged heart healthy diet, increase exercise, avoid trans fats, consider a krill oil cap daily 

## 2017-05-21 NOTE — Assessment & Plan Note (Signed)
Encouraged moist heat and gentle stretching as tolerated. May try NSAIDs and prescription meds as directed and report if symptoms worsen or seek immediate care 

## 2017-05-29 ENCOUNTER — Other Ambulatory Visit (INDEPENDENT_AMBULATORY_CARE_PROVIDER_SITE_OTHER): Payer: 59

## 2017-05-29 DIAGNOSIS — Z79899 Other long term (current) drug therapy: Secondary | ICD-10-CM

## 2017-05-29 DIAGNOSIS — D649 Anemia, unspecified: Secondary | ICD-10-CM

## 2017-05-29 DIAGNOSIS — E782 Mixed hyperlipidemia: Secondary | ICD-10-CM

## 2017-05-29 DIAGNOSIS — E559 Vitamin D deficiency, unspecified: Secondary | ICD-10-CM

## 2017-05-29 DIAGNOSIS — R002 Palpitations: Secondary | ICD-10-CM

## 2017-05-29 LAB — COMPREHENSIVE METABOLIC PANEL
ALT: 12 U/L (ref 0–35)
AST: 16 U/L (ref 0–37)
Albumin: 4.3 g/dL (ref 3.5–5.2)
Alkaline Phosphatase: 41 U/L (ref 39–117)
BILIRUBIN TOTAL: 0.4 mg/dL (ref 0.2–1.2)
BUN: 11 mg/dL (ref 6–23)
CHLORIDE: 105 meq/L (ref 96–112)
CO2: 31 meq/L (ref 19–32)
CREATININE: 0.64 mg/dL (ref 0.40–1.20)
Calcium: 9.4 mg/dL (ref 8.4–10.5)
GFR: 105.51 mL/min (ref >60.00)
GLUCOSE: 98 mg/dL (ref 70–99)
Potassium: 4 mEq/L (ref 3.5–5.1)
SODIUM: 140 meq/L (ref 135–145)
Total Protein: 7.2 g/dL (ref 6.0–8.3)

## 2017-05-29 LAB — LIPID PANEL
CHOL/HDL RATIO: 3
Cholesterol: 175 mg/dL (ref 0–200)
HDL: 65.1 mg/dL (ref >39.00)
LDL CALC: 99 mg/dL (ref 0–99)
NONHDL: 109.98
Triglycerides: 57 mg/dL (ref 0.0–149.0)
VLDL: 11.4 mg/dL (ref 0.0–40.0)

## 2017-05-29 LAB — CBC
HEMATOCRIT: 39.7 % (ref 36.0–46.0)
Hemoglobin: 13.1 g/dL (ref 12.0–15.0)
MCHC: 33.1 g/dL (ref 30.0–36.0)
MCV: 91.1 fl (ref 78.0–100.0)
Platelets: 215 10*3/uL (ref 150.0–400.0)
RBC: 4.36 Mil/uL (ref 3.87–5.11)
RDW: 12.5 % (ref 11.5–15.5)
WBC: 5.5 10*3/uL (ref 4.0–10.5)

## 2017-05-29 LAB — TSH: TSH: 0.21 u[IU]/mL — AB (ref 0.35–4.50)

## 2017-05-29 LAB — VITAMIN D 25 HYDROXY (VIT D DEFICIENCY, FRACTURES): VITD: 32.62 ng/mL (ref 30.00–100.00)

## 2017-05-29 MED FILL — ALPRAZolam 0.25 MG TABS: 0.25 | 10 days supply | Qty: 30 | Fill #0

## 2017-05-29 NOTE — Addendum Note (Signed)
Addended by: Caffie Pinto on: 05/29/2017 09:38 AM   Modules accepted: Orders

## 2017-05-30 ENCOUNTER — Other Ambulatory Visit (INDEPENDENT_AMBULATORY_CARE_PROVIDER_SITE_OTHER): Payer: 59

## 2017-05-30 DIAGNOSIS — R7989 Other specified abnormal findings of blood chemistry: Secondary | ICD-10-CM

## 2017-05-30 LAB — T4, FREE: Free T4: 1.14 ng/dL (ref 0.60–1.60)

## 2017-06-03 LAB — PAIN MGMT, PROFILE 8 W/CONF, U
6 ACETYLMORPHINE: NEGATIVE ng/mL (ref <10)
ALPHAHYDROXYMIDAZOLAM: NEGATIVE ng/mL (ref <50)
ALPHAHYDROXYTRIAZOLAM: NEGATIVE ng/mL (ref <50)
Alcohol Metabolites: NEGATIVE ng/mL (ref <500)
Alphahydroxyalprazolam: NEGATIVE ng/mL (ref <25)
Aminoclonazepam: NEGATIVE ng/mL (ref <25)
Amphetamines: NEGATIVE ng/mL (ref <500)
BENZODIAZEPINES: NEGATIVE ng/mL (ref <100)
Buprenorphine, Urine: NEGATIVE ng/mL (ref <5)
CREATININE: 226 mg/dL
Cocaine Metabolite: NEGATIVE ng/mL (ref <150)
Hydroxyethylflurazepam: NEGATIVE ng/mL (ref <50)
LORAZEPAM: NEGATIVE ng/mL (ref <50)
MARIJUANA METABOLITE: NEGATIVE ng/mL (ref <20)
MDMA: NEGATIVE ng/mL (ref <500)
NORDIAZEPAM: NEGATIVE ng/mL (ref <50)
OPIATES: NEGATIVE ng/mL (ref <100)
Oxazepam: NEGATIVE ng/mL (ref <50)
Oxidant: NEGATIVE ug/mL (ref <200)
Oxycodone: NEGATIVE ng/mL (ref <100)
Temazepam: NEGATIVE ng/mL (ref <50)
pH: 6.59 (ref 4.5–9.0)

## 2017-06-27 ENCOUNTER — Ambulatory Visit (INDEPENDENT_AMBULATORY_CARE_PROVIDER_SITE_OTHER): Payer: 59 | Admitting: Family Medicine

## 2017-06-27 ENCOUNTER — Encounter: Payer: Self-pay | Admitting: Family Medicine

## 2017-06-27 VITALS — BP 108/72 | HR 72 | Temp 98.1°F | Ht 63.5 in | Wt 130.0 lb

## 2017-06-27 DIAGNOSIS — M545 Low back pain, unspecified: Secondary | ICD-10-CM

## 2017-06-27 MED ORDER — METHOCARBAMOL 500 MG PO TABS: 500.0000 mg | ORAL_TABLET | Freq: Three times a day (TID) | ORAL | 0 refills | Status: AC | PRN

## 2017-06-27 MED ORDER — TRAMADOL HCL 50 MG PO TABS: 50.0000 mg | ORAL_TABLET | Freq: Three times a day (TID) | ORAL | 0 refills | Status: AC | PRN

## 2017-06-27 MED FILL — METHOCARBAMOL 500 MG TABLET: 500 | 10 days supply | Qty: 30 | Fill #0

## 2017-06-27 MED FILL — traMADol HCL 50 MG TABS: 50 | 7 days supply | Qty: 20 | Fill #0

## 2017-06-27 NOTE — Progress Notes (Signed)
Pre visit review using our clinic review tool, if applicable. No additional management support is needed unless otherwise documented below in the visit note. 

## 2017-06-27 NOTE — Patient Instructions (Signed)
Heat (pad or rice pillow in microwave) over affected area, 10-15 minutes every 2-3 hours while awake.   Cont Tylenol and Aleve.  EXERCISES  RANGE OF MOTION (ROM) AND STRETCHING EXERCISES - Low Back Prain Most people with lower back pain will find that their symptoms get worse with excessive bending forward (flexion) or arching at the lower back (extension). The exercises that will help resolve your symptoms will focus on the opposite motion.  If you have pain, numbness or tingling which travels down into your buttocks, leg or foot, the goal of the therapy is for these symptoms to move closer to your back and eventually resolve. Sometimes, these leg symptoms will get better, but your lower back pain may worsen. This is often an indication of progress in your rehabilitation. Be very alert to any changes in your symptoms and the activities in which you participated in the 24 hours prior to the change. Sharing this information with your caregiver will allow him or her to most efficiently treat your condition. These exercises may help you when beginning to rehabilitate your injury. Your symptoms may resolve with or without further involvement from your physician, physical therapist or athletic trainer. While completing these exercises, remember:   Restoring tissue flexibility helps normal motion to return to the joints. This allows healthier, less painful movement and activity.  An effective stretch should be held for at least 30 seconds.  A stretch should never be painful. You should only feel a gentle lengthening or release in the stretched tissue. FLEXION RANGE OF MOTION AND STRETCHING EXERCISES:  STRETCH - Flexion, Single Knee to Chest   Lie on a firm bed or floor with both legs extended in front of you.  Keeping one leg in contact with the floor, bring your opposite knee to your chest. Hold your leg in place by either grabbing behind your thigh or at your knee.  Pull until you feel a gentle  stretch in your low back. Hold 30 seconds.  Slowly release your grasp and repeat the exercise with the opposite side. Repeat 2 times. Complete this exercise 3 times per week.   STRETCH - Flexion, Double Knee to Chest  Lie on a firm bed or floor with both legs extended in front of you.  Keeping one leg in contact with the floor, bring your opposite knee to your chest.  Tense your stomach muscles to support your back and then lift your other knee to your chest. Hold your legs in place by either grabbing behind your thighs or at your knees.  Pull both knees toward your chest until you feel a gentle stretch in your low back. Hold 30 seconds.  Tense your stomach muscles and slowly return one leg at a time to the floor. Repeat 2 times. Complete this exercise 3 times per week.   STRETCH - Low Trunk Rotation  Lie on a firm bed or floor. Keeping your legs in front of you, bend your knees so they are both pointed toward the ceiling and your feet are flat on the floor.  Extend your arms out to the side. This will stabilize your upper body by keeping your shoulders in contact with the floor.  Gently and slowly drop both knees together to one side until you feel a gentle stretch in your low back. Hold for 30 seconds.  Tense your stomach muscles to support your lower back as you bring your knees back to the starting position. Repeat the exercise to the other side.  Repeat 2 times. Complete this exercise at least 3 times per week.   EXTENSION RANGE OF MOTION AND FLEXIBILITY EXERCISES:  STRETCH - Extension, Prone on Elbows   Lie on your stomach on the floor, a bed will be too soft. Place your palms about shoulder width apart and at the height of your head.  Place your elbows under your shoulders. If this is too painful, stack pillows under your chest.  Allow your body to relax so that your hips drop lower and make contact more completely with the floor.  Hold this position for 30  seconds.  Slowly return to lying flat on the floor. Repeat 2 times. Complete this exercise 3 times per week.   RANGE OF MOTION - Extension, Prone Press Ups  Lie on your stomach on the floor, a bed will be too soft. Place your palms about shoulder width apart and at the height of your head.  Keeping your back as relaxed as possible, slowly straighten your elbows while keeping your hips on the floor. You may adjust the placement of your hands to maximize your comfort. As you gain motion, your hands will come more underneath your shoulders.  Hold this position 30 seconds.  Slowly return to lying flat on the floor. Repeat 2 times. Complete this exercise 3 times per week.   RANGE OF MOTION- Quadruped, Neutral Spine   Assume a hands and knees position on a firm surface. Keep your hands under your shoulders and your knees under your hips. You may place padding under your knees for comfort.  Drop your head and point your tailbone toward the ground below you. This will round out your lower back like an angry cat. Hold this position for 30 seconds.  Slowly lift your head and release your tail bone so that your back sags into a large arch, like an old horse.  Hold this position for 30 seconds.  Repeat this until you feel limber in your low back.  Now, find your "sweet spot." This will be the most comfortable position somewhere between the two previous positions. This is your neutral spine. Once you have found this position, tense your stomach muscles to support your low back.  Hold this position for 30 seconds. Repeat 2 times. Complete this exercise 3 times per week.   STRENGTHENING EXERCISES - Low Back Sprain These exercises may help you when beginning to rehabilitate your injury. These exercises should be done near your "sweet spot." This is the neutral, low-back arch, somewhere between fully rounded and fully arched, that is your least painful position. When performed in this safe range of  motion, these exercises can be used for people who have either a flexion or extension based injury. These exercises may resolve your symptoms with or without further involvement from your physician, physical therapist or athletic trainer. While completing these exercises, remember:   Muscles can gain both the endurance and the strength needed for everyday activities through controlled exercises.  Complete these exercises as instructed by your physician, physical therapist or athletic trainer. Increase the resistance and repetitions only as guided.  You may experience muscle soreness or fatigue, but the pain or discomfort you are trying to eliminate should never worsen during these exercises. If this pain does worsen, stop and make certain you are following the directions exactly. If the pain is still present after adjustments, discontinue the exercise until you can discuss the trouble with your caregiver.  STRENGTHENING - Deep Abdominals, Pelvic Tilt   Melinda Kirk  on a firm bed or floor. Keeping your legs in front of you, bend your knees so they are both pointed toward the ceiling and your feet are flat on the floor.  Tense your lower abdominal muscles to press your low back into the floor. This motion will rotate your pelvis so that your tail bone is scooping upwards rather than pointing at your feet or into the floor. With a gentle tension and even breathing, hold this position for 3 seconds. Repeat 2 times. Complete this exercise 3 times per week.   STRENGTHENING - Abdominals, Crunches   Lie on a firm bed or floor. Keeping your legs in front of you, bend your knees so they are both pointed toward the ceiling and your feet are flat on the floor. Cross your arms over your chest.  Slightly tip your chin down without bending your neck.  Tense your abdominals and slowly lift your trunk high enough to just clear your shoulder blades. Lifting higher can put excessive stress on the lower back and does not  further strengthen your abdominal muscles.  Control your return to the starting position. Repeat 2 times. Complete this exercise 3 times per week.   STRENGTHENING - Quadruped, Opposite UE/LE Lift   Assume a hands and knees position on a firm surface. Keep your hands under your shoulders and your knees under your hips. You may place padding under your knees for comfort.  Find your neutral spine and gently tense your abdominal muscles so that you can maintain this position. Your shoulders and hips should form a rectangle that is parallel with the floor and is not twisted.  Keeping your trunk steady, lift your right hand no higher than your shoulder and then your left leg no higher than your hip. Make sure you are not holding your breath. Hold this position for 30 seconds.  Continuing to keep your abdominal muscles tense and your back steady, slowly return to your starting position. Repeat with the opposite arm and leg. Repeat 2 times. Complete this exercise 3 times per week.   STRENGTHENING - Abdominals and Quadriceps, Straight Leg Raise   Lie on a firm bed or floor with both legs extended in front of you.  Keeping one leg in contact with the floor, bend the other knee so that your foot can rest flat on the floor.  Find your neutral spine, and tense your abdominal muscles to maintain your spinal position throughout the exercise.  Slowly lift your straight leg off the floor about 6 inches for a count of 3, making sure to not hold your breath.  Still keeping your neutral spine, slowly lower your leg all the way to the floor. Repeat this exercise with each leg 2 times. Complete this exercise 3 times per week.  POSTURE AND BODY MECHANICS CONSIDERATIONS - Low Back Sprain Keeping correct posture when sitting, standing or completing your activities will reduce the stress put on different body tissues, allowing injured tissues a chance to heal and limiting painful experiences. The following are  general guidelines for improved posture.  While reading these guidelines, remember:  The exercises prescribed by your provider will help you have the flexibility and strength to maintain correct postures.  The correct posture provides the best environment for your joints to work. All of your joints have less wear and tear when properly supported by a spine with good posture. This means you will experience a healthier, less painful body.  Correct posture must be practiced with all  of your activities, especially prolonged sitting and standing. Correct posture is as important when doing repetitive low-stress activities (typing) as it is when doing a single heavy-load activity (lifting).  RESTING POSITIONS Consider which positions are most painful for you when choosing a resting position. If you have pain with flexion-based activities (sitting, bending, stooping, squatting), choose a position that allows you to rest in a less flexed posture. You would want to avoid curling into a fetal position on your side. If your pain worsens with extension-based activities (prolonged standing, working overhead), avoid resting in an extended position such as sleeping on your stomach. Most people will find more comfort when they rest with their spine in a more neutral position, neither too rounded nor too arched. Lying on a non-sagging bed on your side with a pillow between your knees, or on your back with a pillow under your knees will often provide some relief. Keep in mind, being in any one position for a prolonged period of time, no matter how correct your posture, can still lead to stiffness.  PROPER SITTING POSTURE In order to minimize stress and discomfort on your spine, you must sit with correct posture. Sitting with good posture should be effortless for a healthy body. Returning to good posture is a gradual process. Many people can work toward this most comfortably by using various supports until they have the  flexibility and strength to maintain this posture on their own. When sitting with proper posture, your ears will fall over your shoulders and your shoulders will fall over your hips. You should use the back of the chair to support your upper back. Your lower back will be in a neutral position, just slightly arched. You may place a small pillow or folded towel at the base of your lower back for  support.  When working at a desk, create an environment that supports good, upright posture. Without extra support, muscles tire, which leads to excessive strain on joints and other tissues. Keep these recommendations in mind:  CHAIR:  A chair should be able to slide under your desk when your back makes contact with the back of the chair. This allows you to work closely.  The chair's height should allow your eyes to be level with the upper part of your monitor and your hands to be slightly lower than your elbows.  BODY POSITION  Your feet should make contact with the floor. If this is not possible, use a foot rest.  Keep your ears over your shoulders. This will reduce stress on your neck and low back.  INCORRECT SITTING POSTURES  If you are feeling tired and unable to assume a healthy sitting posture, do not slouch or slump. This puts excessive strain on your back tissues, causing more damage and pain. Healthier options include:  Using more support, like a lumbar pillow.  Switching tasks to something that requires you to be upright or walking.  Talking a brief walk.  Lying down to rest in a neutral-spine position.  PROLONGED STANDING WHILE SLIGHTLY LEANING FORWARD  When completing a task that requires you to lean forward while standing in one place for a long time, place either foot up on a stationary 2-4 inch high object to help maintain the best posture. When both feet are on the ground, the lower back tends to lose its slight inward curve. If this curve flattens (or becomes too large), then the  back and your other joints will experience too much stress, tire more  quickly, and can cause pain.  CORRECT STANDING POSTURES Proper standing posture should be assumed with all daily activities, even if they only take a few moments, like when brushing your teeth. As in sitting, your ears should fall over your shoulders and your shoulders should fall over your hips. You should keep a slight tension in your abdominal muscles to brace your spine. Your tailbone should point down to the ground, not behind your body, resulting in an over-extended swayback posture.   INCORRECT STANDING POSTURES  Common incorrect standing postures include a forward head, locked knees and/or an excessive swayback. WALKING Walk with an upright posture. Your ears, shoulders and hips should all line-up.  PROLONGED ACTIVITY IN A FLEXED POSITION When completing a task that requires you to bend forward at your waist or lean over a low surface, try to find a way to stabilize 3 out of 4 of your limbs. You can place a hand or elbow on your thigh or rest a knee on the surface you are reaching across. This will provide you more stability, so that your muscles do not tire as quickly. By keeping your knees relaxed, or slightly bent, you will also reduce stress across your lower back. CORRECT LIFTING TECHNIQUES  DO :  Assume a wide stance. This will provide you more stability and the opportunity to get as close as possible to the object which you are lifting.  Tense your abdominals to brace your spine. Bend at the knees and hips. Keeping your back locked in a neutral-spine position, lift using your leg muscles. Lift with your legs, keeping your back straight.  Test the weight of unknown objects before attempting to lift them.  Try to keep your elbows locked down at your sides in order get the best strength from your shoulders when carrying an object.     Always ask for help when lifting heavy or awkward objects. INCORRECT  LIFTING TECHNIQUES DO NOT:   Lock your knees when lifting, even if it is a small object.  Bend and twist. Pivot at your feet or move your feet when needing to change directions.  Assume that you can safely pick up even a paperclip without proper posture.

## 2017-06-27 NOTE — Progress Notes (Signed)
Musculoskeletal Exam  Patient: Melinda Kirk DOB: 1971/05/19  DOS: 06/27/2017  SUBJECTIVE:  Chief Complaint:   Chief Complaint  Patient presents with  . Back Pain    [facility] [name] is a 46 y.o.  female for evaluation and treatment of her back pain.   Onset: 5-6 years, worse over past several days Location: Lower Character:  aching and sharp  Progression of issue:  has worsened Associated symptoms: Decreased ROM Denies bowel/bladder incontinence or weakness Treatment: to date has been OTC NSAIDS, acetaminophen and heat. She does not do specific stretches/exercises for this; she had MRI that showed mild arthritic changes   Neurovascular symptoms: no  ROS: Gastrointestinal: no bowel incontinence Genitourinary: No bladder incontinence Musculoskeletal/Extremities: +back pain Neurologic: no numbness, tingling no weakness   Past Medical History:  Diagnosis Date  . Anemia 07/08/2015  . Breast calcification, left 07/29/04  . Cryptogenic stroke ([place]) 2015   a. s/p PFO closure on 07/03/16  . Depression with anxiety 05/21/2017  . GERD (gastroesophageal reflux disease)   . Menorrhagia 03/07/2013  . PFO (patent foramen ovale)    a. s/p PFO closure 07/03/16  . Thyroid nodule    s/p FNA- 4/09- benign  . Unspecified hypothyroidism 01/15/2011  . Vitamin D deficiency 03/08/2011   Past Surgical History:  Procedure Laterality Date  . BIOPSY THYROID  10/2007   benign biopsy of thyroid nodules.  [name] PATENT FORAMEN OVALE CLOSURE  07/03/2016  . PATENT FORAMEN OVALE CLOSURE N/A 07/03/2016   Procedure: PFO  Closure;  Surgeon: [name], MD;  Location: [facility];  Service: Cardiovascular;  Laterality: N/A;  . TEE WITHOUT CARDIOVERSION N/A 02/10/2014   Procedure: TRANSESOPHAGEAL ECHOCARDIOGRAM (TEE);  Surgeon: [name], MD;  Location: [facility];  Service: Cardiovascular;  Laterality: N/A;   Objective:  VITAL SIGNS: BP 108/72 (BP Location: Left Arm, Patient  Position: Sitting, Cuff Size: Normal)   Pulse 72   Temp 98.1 F (36.7 C) (Oral)   Ht 5' 3.5" (1.613 m)   Wt 130 lb (59 kg)   SpO2 99%   BMI 22.67 kg/m  Constitutional: Well formed, well developed. No acute distress. HENT: Normocephalic, atraumatic.  Moist mucous membranes.  Eyes:  EOM grossly intact. Conjunctiva normal.  Lungs: No access msc use Extremities: No clubbing. No cyanosis. No edema.  Skin: Warm. Dry. No erythema. No rash.  Musculoskeletal: low back.   Tenderness to palpation: yes, over midline Tight hamstrings, worse on R Deformity: no Ecchymosis: no Straight leg test: negative for  5/5 strength throughout Neurologic: Normal sensory function. No focal deficits noted. DTR's equal and symmetric in LE's. No clonus. Psychiatric: Normal mood. Age appropriate judgment and insight. Alert & oriented x 3.    Assessment:  Acute midline low back pain without sciatica - Plan: traMADol (ULTRAM) 50 MG tablet, methocarbamol (ROBAXIN) 500 MG tablet  Plan: Orders as above. Heat. Ice. Cont Tylenol and NSAID. Home stretches/exercises. F/u prn.  The patient voiced understanding and agreement to the plan.   [place], DO 06/27/17  11:33 AM

## 2017-07-01 ENCOUNTER — Telehealth: Payer: Self-pay

## 2017-07-01 NOTE — Telephone Encounter (Signed)
Received [employer] paperwork in regards to pt.  Pt has not been seen since September 2015.  Fax returned stating that pt will need to be seen in office before we can fill out paperwork.  Paperwork placed in shred bin.

## 2017-07-25 ENCOUNTER — Telehealth: Payer: Self-pay

## 2017-07-25 NOTE — Telephone Encounter (Signed)
Received second set of paperwork for FMLA.  Again, pt not seen in this office since August of 2015.  Return faxed paperwork to sender with date of LOV.  Paperwork placed in shred bin.

## 2017-07-30 ENCOUNTER — Telehealth: Payer: Self-pay | Admitting: *Deleted

## 2017-07-30 NOTE — Telephone Encounter (Signed)
Copied from [place] [id number]. Topic: General - Other >> Jul 18, 2017  1:36 PM Yvette Rack wrote: Reason for CRM: patient calling stating that she has been to to the office in Oct and Nov 2018 the information was given to [employer] wrong please call pt if you have any questions  There is no documentation that I ever received this patient's paperwork, either from front desk and/or provider. Patient has seen at least two providers during the period she is referencing. Please get more information and forward to appropriate source. Thanks/SLS 01/02

## 2017-10-06 DIAGNOSIS — H524 Presbyopia: Secondary | ICD-10-CM | POA: Diagnosis not present

## 2018-01-13 ENCOUNTER — Ambulatory Visit: Payer: Self-pay | Admitting: Family Medicine

## 2018-07-01 IMAGING — DX DG KNEE 1-2V*R*
4 series · 4 of 4 positions shown · non-contrast
Comparison: None.

CLINICAL DATA: 45-year-old female complaining of bilateral knee
pain, most severe on the left for the past several months.

EXAM:
RIGHT KNEE - 1-2 VIEW

[tunnel]
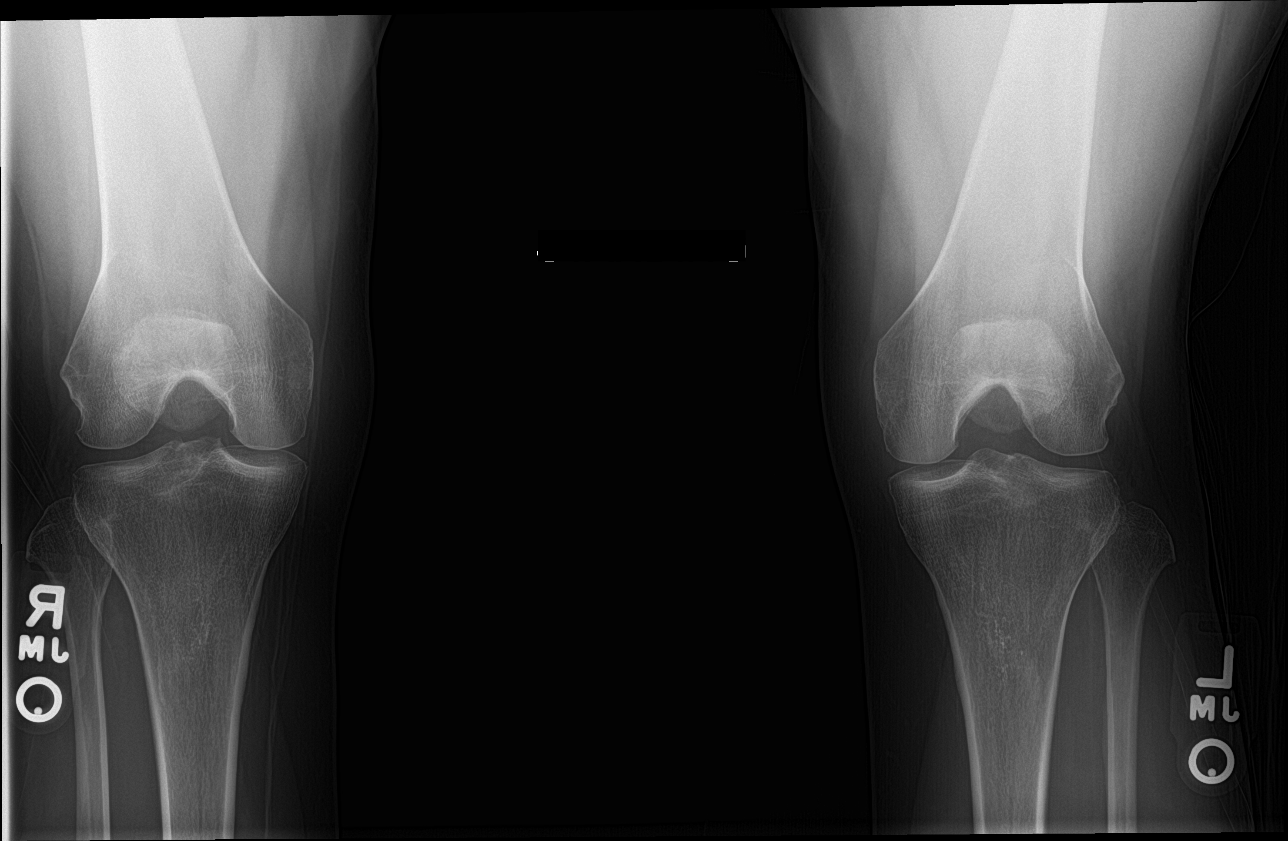

[knee lat]
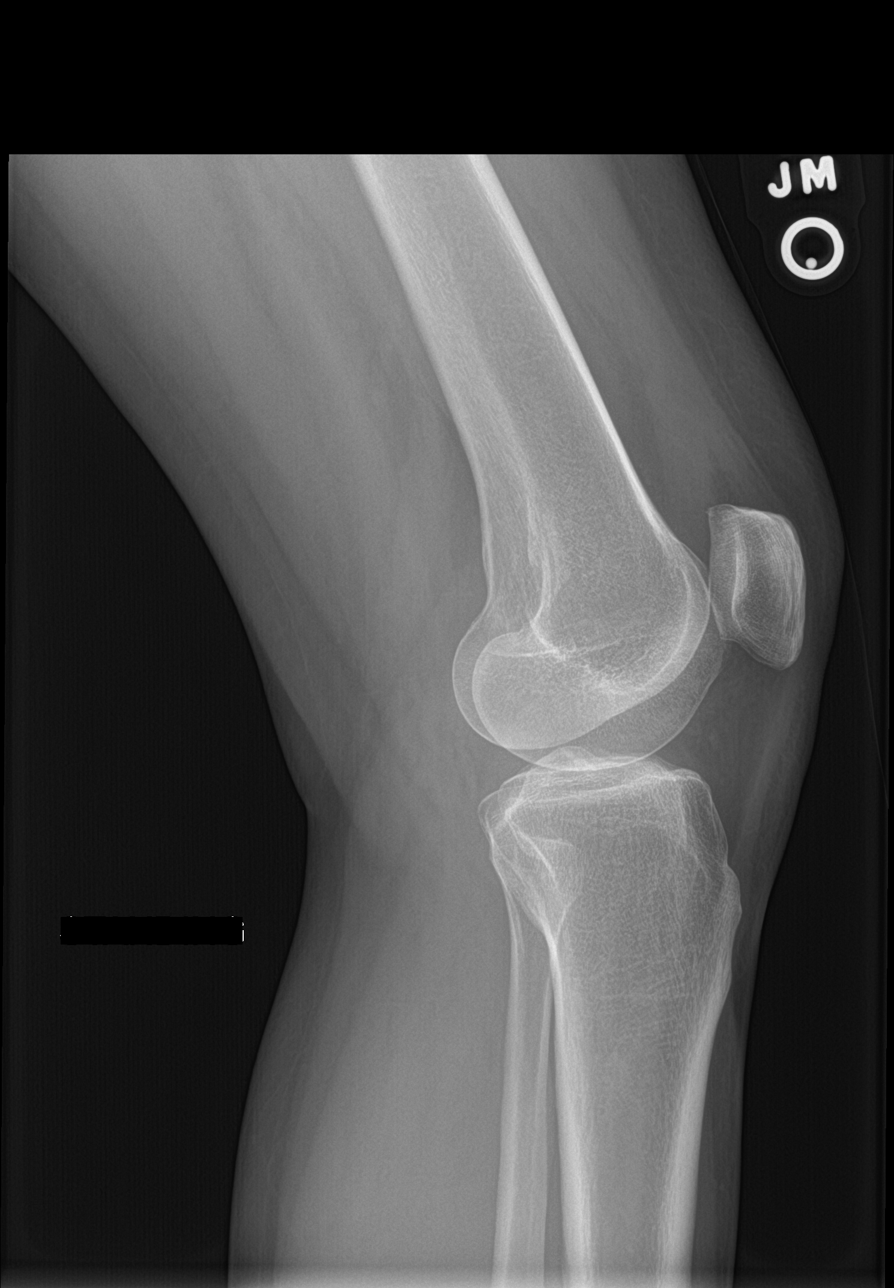

[knee sunrise]
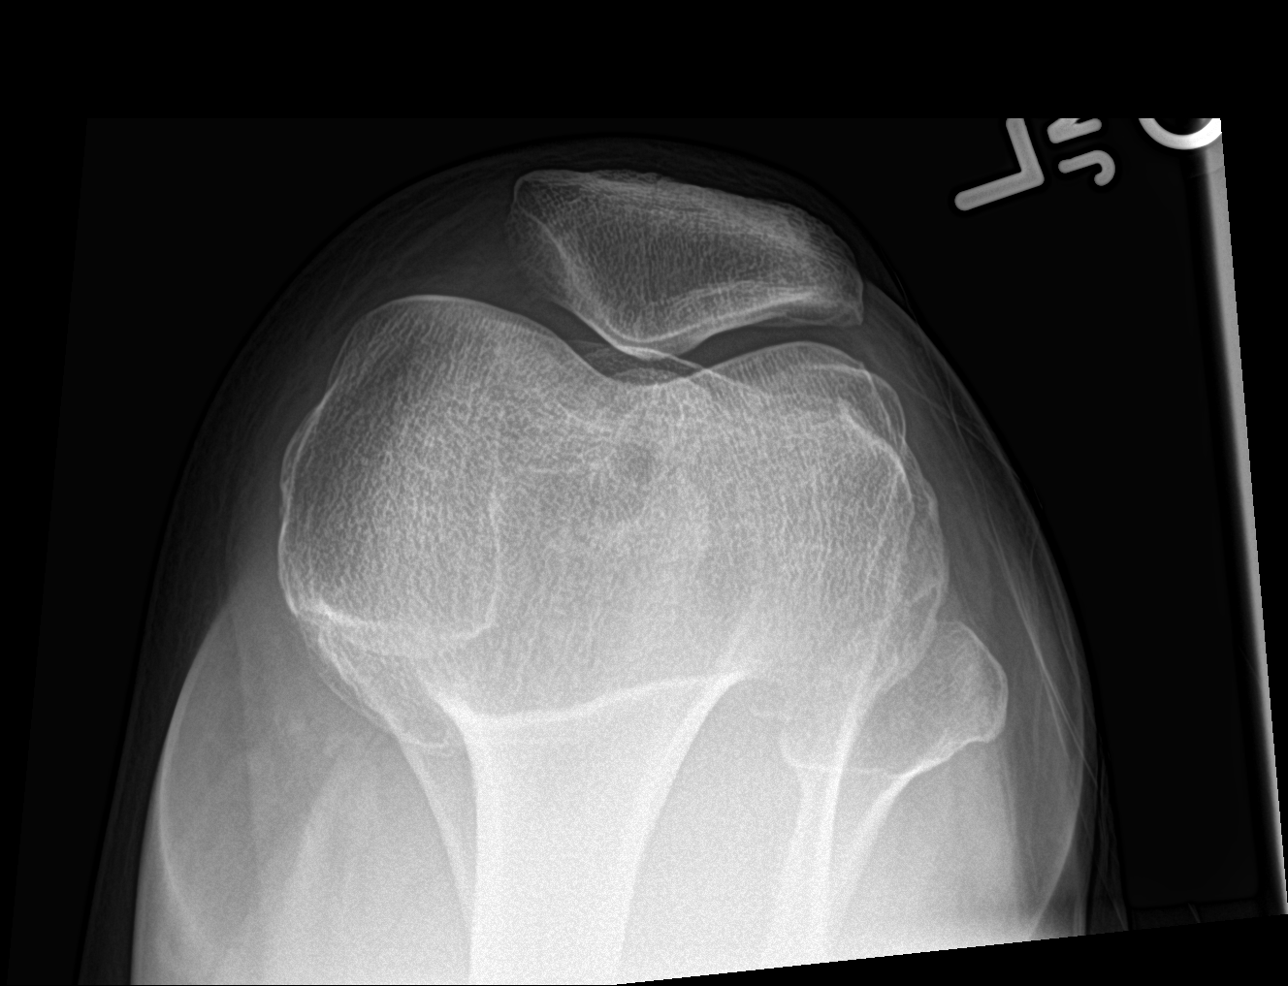

[knee ap bilat standing]
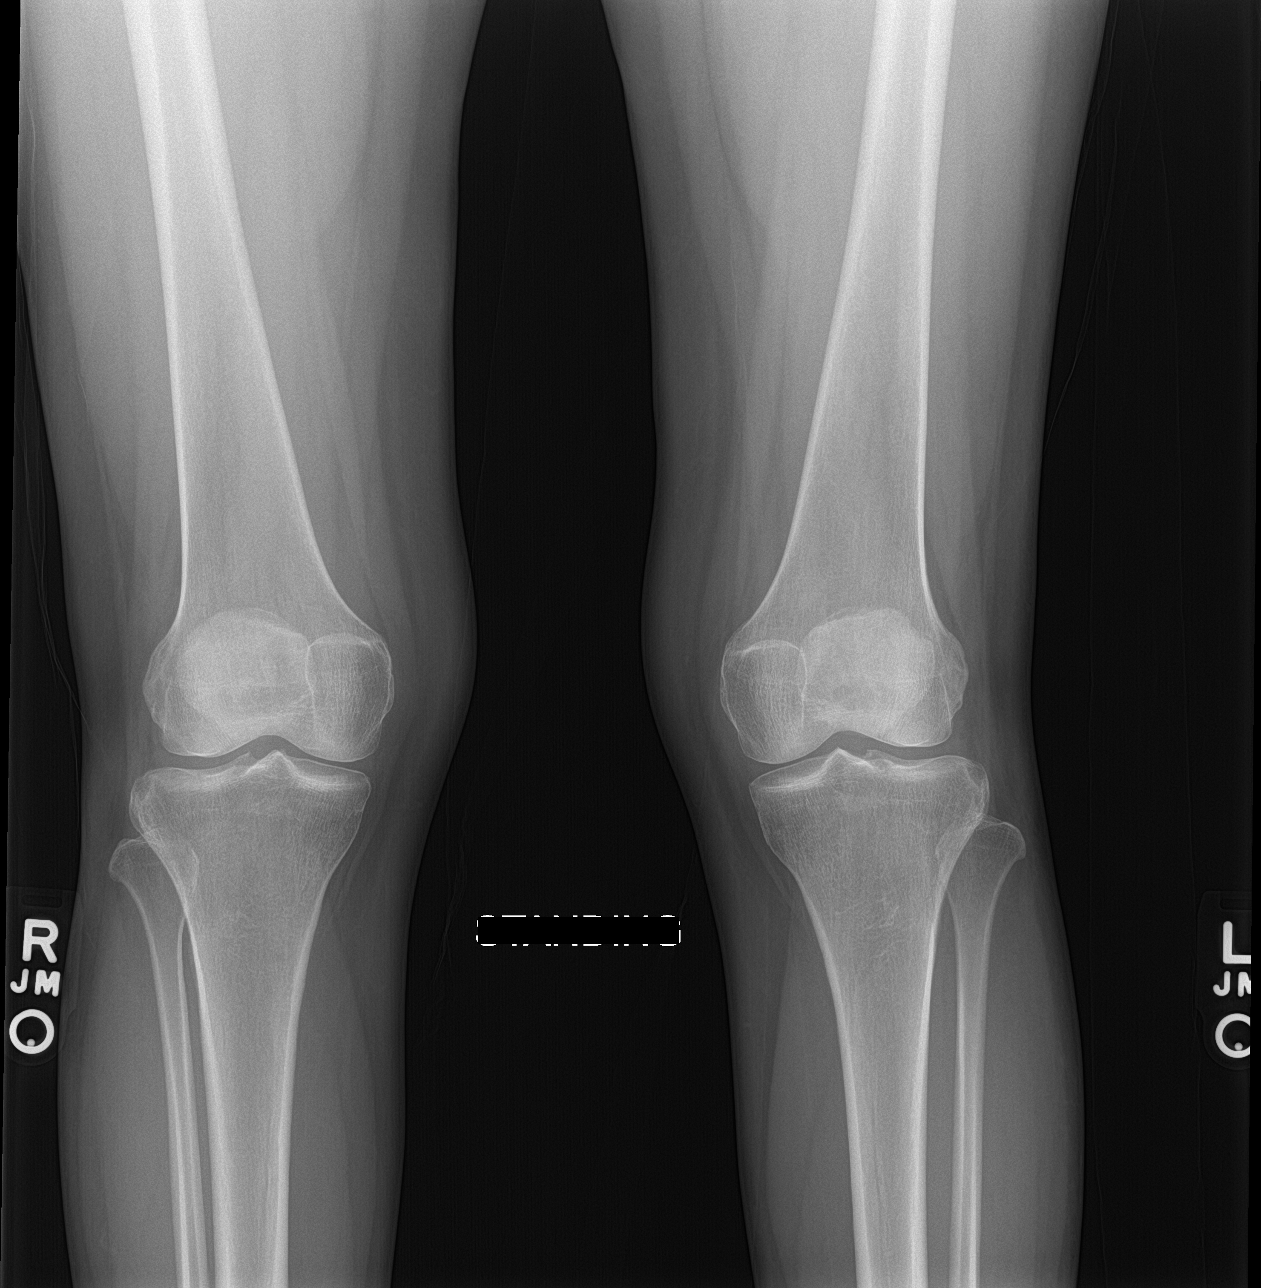

[4 of 4 positions shown; findings below may reference images not displayed]

FINDINGS: No evidence of fracture, dislocation, or joint effusion. No evidence
of arthropathy or other focal bone abnormality. Soft tissues are
unremarkable.
IMPRESSION: Negative.

## 2018-07-01 IMAGING — DX DG SHOULDER 2+V*L*
3 series · 3 of 3 positions shown · non-contrast
Comparison: Chest x-ray which included a portion of the left
shoulder dated March 05, 2016

CLINICAL DATA: Several month history of left shoulder pain. Painful
abduction. No known injury.

EXAM:
LEFT SHOULDER - 2+ VIEW

[shoulder grashey]
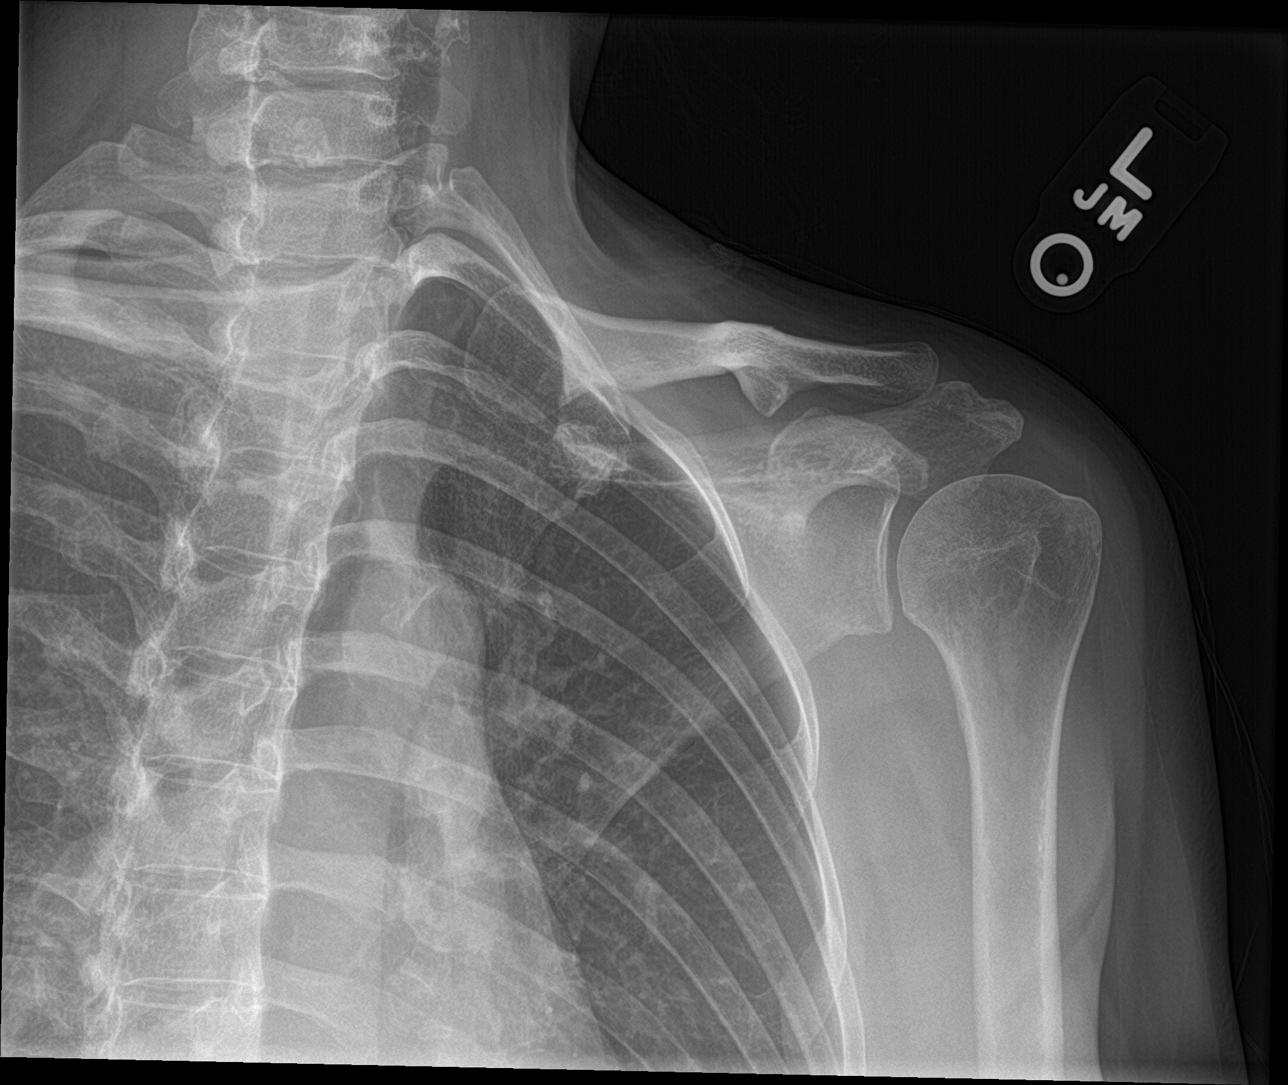

[shoulder y view]
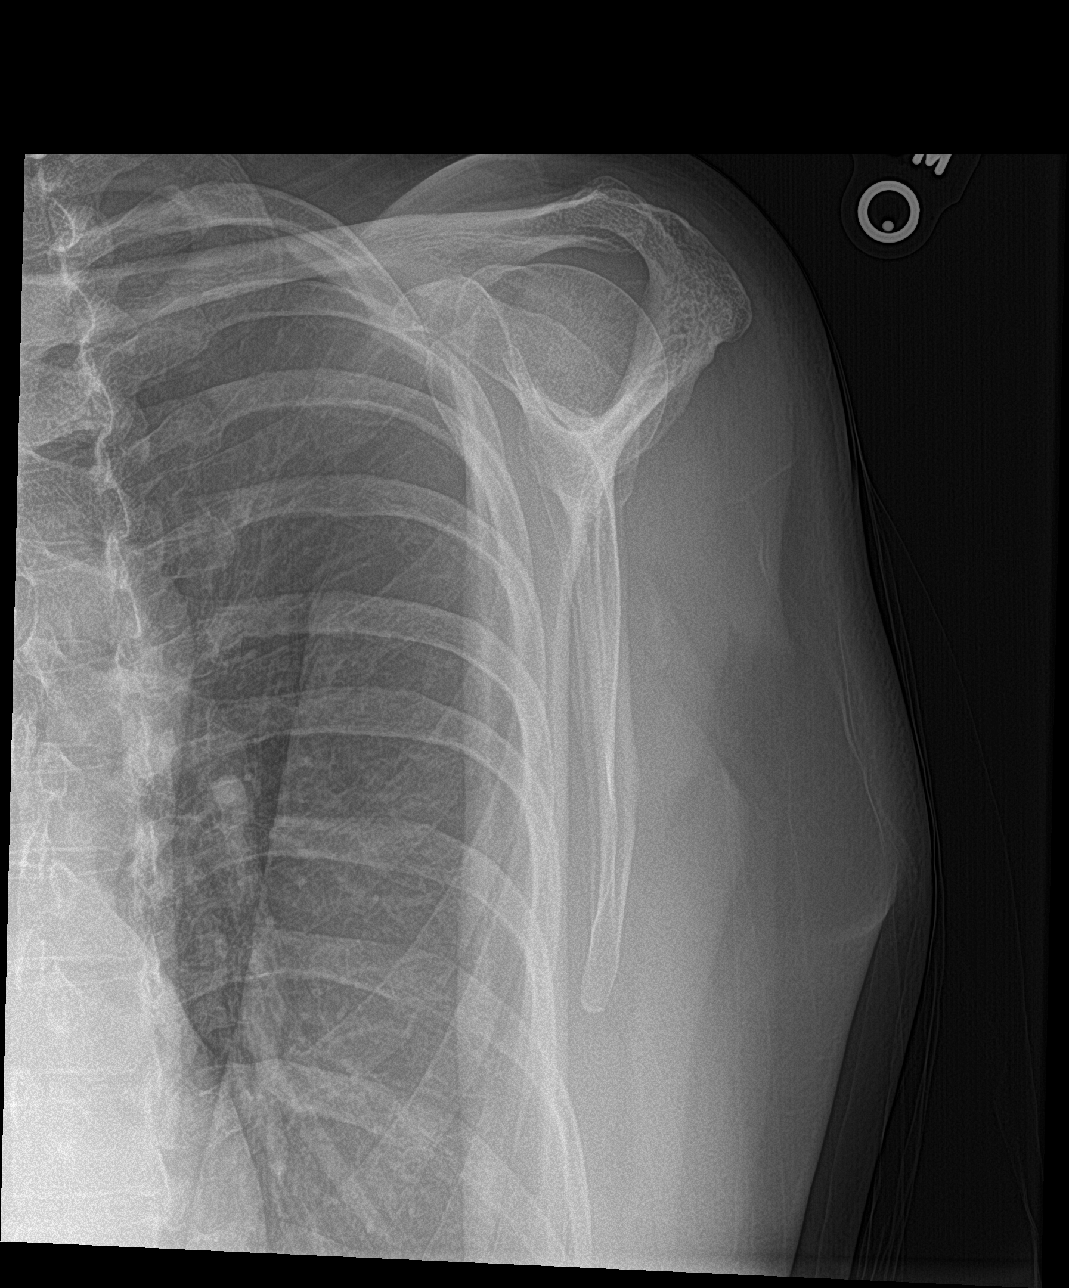

[shoulder axillary]
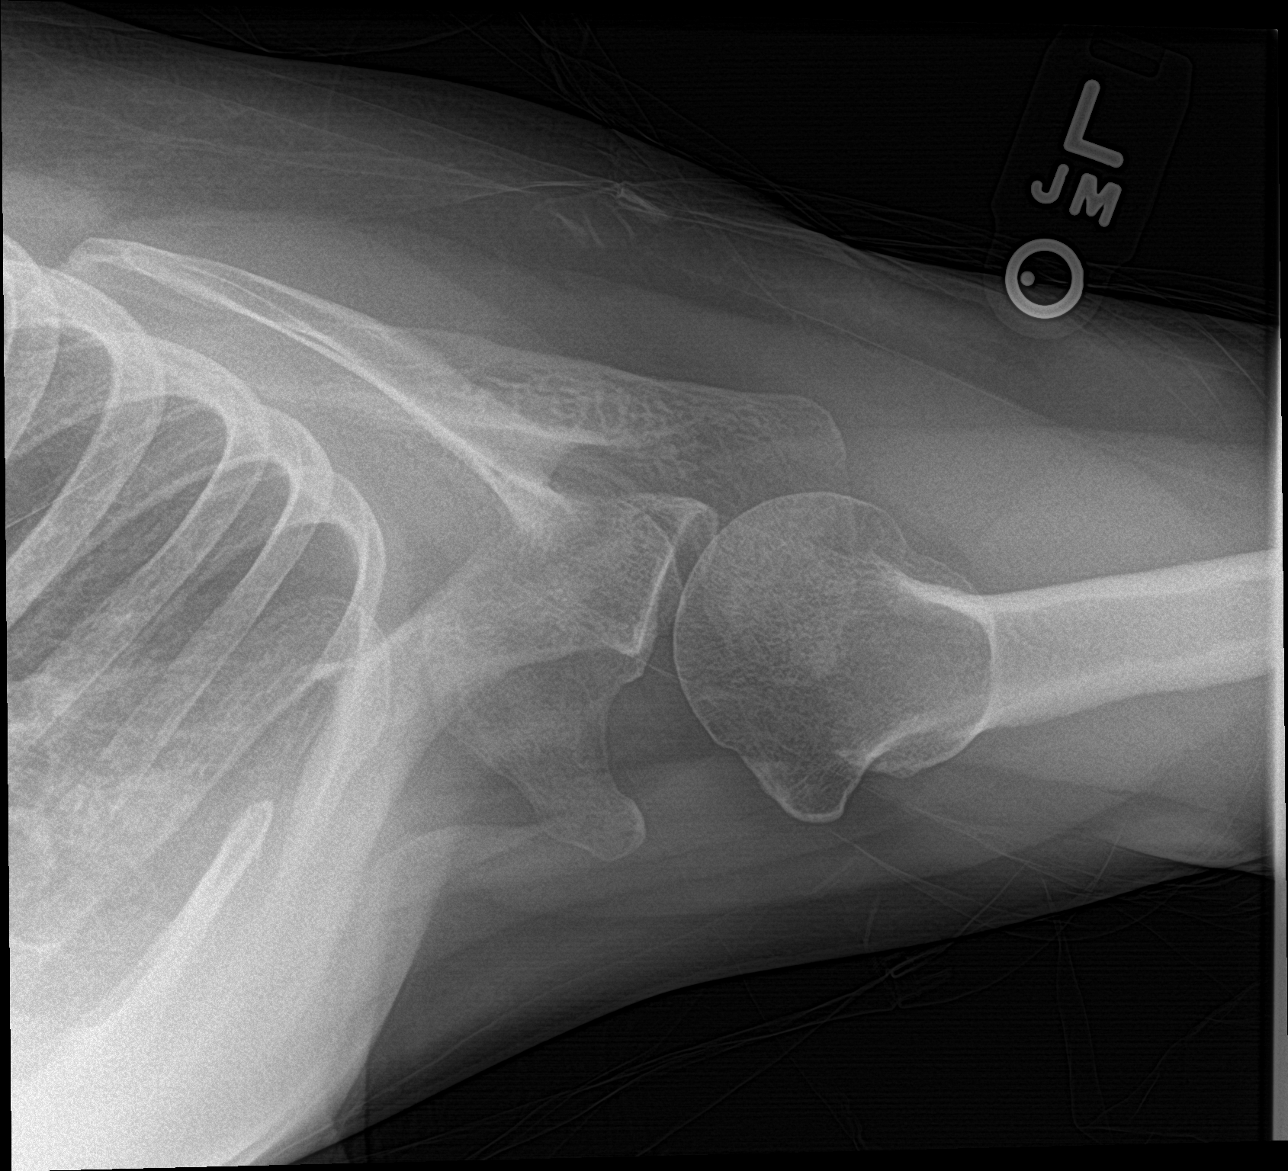

[3 of 3 positions shown; findings below may reference images not displayed]

FINDINGS: The bones are subjectively adequately mineralized. The glenohumeral
and AC joint spaces are preserved. The articular surfaces of the
humeral head and bony glenoid appear normal. The subacromial
subdeltoid space is normal. The observed portions of the left
clavicle and upper left ribs are normal.
IMPRESSION: There is no acute or significant chronic bony abnormality of the
left shoulder. Given the chronic persistent symptoms, MRI would be a
useful next imaging step.

## 2018-08-04 ENCOUNTER — Encounter: Payer: 59 | Admitting: Family Medicine

## 2018-08-14 ENCOUNTER — Encounter: Payer: Self-pay | Admitting: Family Medicine

## 2018-08-14 ENCOUNTER — Ambulatory Visit (INDEPENDENT_AMBULATORY_CARE_PROVIDER_SITE_OTHER): Payer: Managed Care, Other (non HMO) | Admitting: Family Medicine

## 2018-08-14 VITALS — BP 98/64 | HR 74 | Temp 97.9°F | Resp 18 | Ht 64.0 in | Wt 115.8 lb

## 2018-08-14 DIAGNOSIS — Z0000 Encounter for general adult medical examination without abnormal findings: Secondary | ICD-10-CM

## 2018-08-14 DIAGNOSIS — E782 Mixed hyperlipidemia: Secondary | ICD-10-CM | POA: Diagnosis not present

## 2018-08-14 DIAGNOSIS — E559 Vitamin D deficiency, unspecified: Secondary | ICD-10-CM

## 2018-08-14 DIAGNOSIS — F418 Other specified anxiety disorders: Secondary | ICD-10-CM | POA: Diagnosis not present

## 2018-08-14 MED ORDER — FAMOTIDINE 40 MG PO TABS: 40.0000 mg | ORAL_TABLET | Freq: Every day | ORAL | 11 refills | Status: AC

## 2018-08-14 MED ORDER — ESTROGENS, CONJUGATED 0.625 MG/GM VA CREA: TOPICAL_CREAM | VAGINAL | 1 refills | Status: AC

## 2018-08-14 NOTE — Patient Instructions (Addendum)
Small, frequent meals with lean proteins. Minimize simple carbohydrate, minimize alcohol and caffeine.   Secretary of State of [place] for healthcare Power of attorney and living will forms, Advanced directives     Preventive Care 40-64 Years, Female Preventive care refers to lifestyle choices and visits with your health care provider that can promote health and wellness. What does preventive care include?   A yearly physical exam. This is also called an annual well check.  Dental exams once or twice a year.  Routine eye exams. Ask your health care provider how often you should have your eyes checked.  Personal lifestyle choices, including: ? Daily care of your teeth and gums. ? Regular physical activity. ? Eating a healthy diet. ? Avoiding tobacco and drug use. ? Limiting alcohol use. ? Practicing safe sex. ? Taking low-dose aspirin daily starting at age 59. ? Taking vitamin and mineral supplements as recommended by your health care provider. What happens during an annual well check? The services and screenings done by your health care provider during your annual well check will depend on your age, overall health, lifestyle risk factors, and family history of disease. Counseling Your health care provider may ask you questions about your:  Alcohol use.  Tobacco use.  Drug use.  Emotional well-being.  Home and relationship well-being.  Sexual activity.  Eating habits.  Work and work Statistician.  Method of birth control.  Menstrual cycle.  Pregnancy history. Screening You may have the following tests or measurements:  Height, weight, and BMI.  Blood pressure.  Lipid and cholesterol levels. These may be checked every 5 years, or more frequently if you are over 62 years old.  Skin check.  Lung cancer screening. You may have this screening every year starting at age 25 if you have a 30-pack-year history of smoking and currently smoke or have quit within the past  15 years.  Colorectal cancer screening. All adults should have this screening starting at age 75 and continuing until age 60. Your health care provider may recommend screening at age 16. You will have tests every 1-10 years, depending on your results and the type of screening test. People at increased risk should start screening at an earlier age. Screening tests may include: ? Guaiac-based fecal occult blood testing. ? Fecal immunochemical test (FIT). ? Stool DNA test. ? Virtual colonoscopy. ? Sigmoidoscopy. During this test, a flexible tube with a tiny camera (sigmoidoscope) is used to examine your rectum and lower colon. The sigmoidoscope is inserted through your anus into your rectum and lower colon. ? Colonoscopy. During this test, a long, thin, flexible tube with a tiny camera (colonoscope) is used to examine your entire colon and rectum.  Hepatitis C blood test.  Hepatitis B blood test.  Sexually transmitted disease (STD) testing.  Diabetes screening. This is done by checking your blood sugar (glucose) after you have not eaten for a while (fasting). You may have this done every 1-3 years.  Mammogram. This may be done every 1-2 years. Talk to your health care provider about when you should start having regular mammograms. This may depend on whether you have a family history of breast cancer.  BRCA-related cancer screening. This may be done if you have a family history of breast, ovarian, tubal, or peritoneal cancers.  Pelvic exam and Pap test. This may be done every 3 years starting at age 79. Starting at age 3, this may be done every 5 years if you have a Pap test in  combination with an HPV test.  Bone density scan. This is done to screen for osteoporosis. You may have this scan if you are at high risk for osteoporosis. Discuss your test results, treatment options, and if necessary, the need for more tests with your health care provider. Vaccines Your health care provider may  recommend certain vaccines, such as:  Influenza vaccine. This is recommended every year.  Tetanus, diphtheria, and acellular pertussis (Tdap, Td) vaccine. You may need a Td booster every 10 years.  Varicella vaccine. You may need this if you have not been vaccinated.  Zoster vaccine. You may need this after age 34.  Measles, mumps, and rubella (MMR) vaccine. You may need at least one dose of MMR if you were born in 1957 or later. You may also need a second dose.  Pneumococcal 13-valent conjugate (PCV13) vaccine. You may need this if you have certain conditions and were not previously vaccinated.  Pneumococcal polysaccharide (PPSV23) vaccine. You may need one or two doses if you smoke cigarettes or if you have certain conditions.  Meningococcal vaccine. You may need this if you have certain conditions.  Hepatitis A vaccine. You may need this if you have certain conditions or if you travel or work in places where you may be exposed to hepatitis A.  Hepatitis B vaccine. You may need this if you have certain conditions or if you travel or work in places where you may be exposed to hepatitis B.  Haemophilus influenzae type b (Hib) vaccine. You may need this if you have certain conditions. Talk to your health care provider about which screenings and vaccines you need and how often you need them. This information is not intended to replace advice given to you by your health care provider. Make sure you discuss any questions you have with your health care provider. Document Released: 08/11/2015 Document Revised: 09/04/2017 Document Reviewed: 05/16/2015 Elsevier Interactive Patient Education  2019 [employer].

## 2018-08-17 NOTE — Assessment & Plan Note (Signed)
Encouraged heart healthy diet, increase exercise, avoid trans fats, consider a krill oil cap daily 

## 2018-08-17 NOTE — Assessment & Plan Note (Signed)
Has changed jobs and separated from her husband of many years and while this has been stressful she feels this is all for the best and her stress is also much lower already

## 2018-08-17 NOTE — Assessment & Plan Note (Signed)
She now works for a different healthcare system and is unsure how her labs will be covered so she is given a hand written prescription to use if she has to get labs run out in the community, check tsh, cbc, cmp, lipid. She can return here if the labs are covered. Given and reviewed copy of ACP documents from [facility] and encouraged to complete and return, given blank do to her changed circumstances

## 2018-08-17 NOTE — Progress Notes (Signed)
Subjective:    Patient ID: Melinda Kirk, female    DOB: 03-Jun-1971, 48 y.o.   MRN: [id number]  No chief complaint on file.   HPI Patient is in today for annual preventative exam. She has left [employer] and is working for a nearby system. She has moved to [place] and is separated from her husband. She feels these have all been good choices and she is doing well all things considered. No recent febrile illness or hospitalizations. Denies CP/palp/SOB/HA/congestion/fevers/GI or GU c/o. Taking meds as prescribed  Past Medical History:  Diagnosis Date  . Anemia 07/08/2015  . Breast calcification, left 07/29/04  . Cryptogenic stroke ([place]) 2015   a. s/p PFO closure on 07/03/16  . Depression with anxiety 05/21/2017  . GERD (gastroesophageal reflux disease)   . Menorrhagia 03/07/2013  . PFO (patent foramen ovale)    a. s/p PFO closure 07/03/16  . Thyroid nodule    s/p FNA- 4/09- benign  . Unspecified hypothyroidism 01/15/2011  . Vitamin D deficiency 03/08/2011    Past Surgical History:  Procedure Laterality Date  . BIOPSY THYROID  10/2007   benign biopsy of thyroid nodules.  [name] PATENT FORAMEN OVALE CLOSURE  07/03/2016  . PATENT FORAMEN OVALE CLOSURE N/A 07/03/2016   Procedure: PFO  Closure;  Surgeon: [name], MD;  Location: [facility];  Service: Cardiovascular;  Laterality: N/A;  . TEE WITHOUT CARDIOVERSION N/A 02/10/2014   Procedure: TRANSESOPHAGEAL ECHOCARDIOGRAM (TEE);  Surgeon: [name], MD;  Location: [facility];  Service: Cardiovascular;  Laterality: N/A;    Family History  Problem Relation Age of Onset  . Stroke Mother   . Arthritis Mother   . Heart disease Father        CHF?    Social History   Socioeconomic History  . Marital status: Married    Spouse name: Not on file  . Number of children: 3  . Years of education: Not on file  . Highest education level: Not on file  Occupational History  . Occupation: Nurse  Social Needs  . Financial  resource strain: Not on file  . Food insecurity:    Worry: Not on file    Inability: Not on file  . Transportation needs:    Medical: Not on file    Non-medical: Not on file  Tobacco Use  . Smoking status: Former Smoker    Packs/day: 0.25    Years: 5.00    Pack years: 1.25    Types: Cigarettes    Last attempt to quit: 1998    Years since quitting: 22.0  . Smokeless tobacco: Never Used  Substance and Sexual Activity  . Alcohol use: Yes    Comment: 07/03/2016 "might have a few drinks/year"  . Drug use: No  . Sexual activity: Yes  Lifestyle  . Physical activity:    Days per week: Not on file    Minutes per session: Not on file  . Stress: Not on file  Relationships  . Social connections:    Talks on phone: Not on file    Gets together: Not on file    Attends religious service: Not on file    Active member of club or organization: Not on file    Attends meetings of clubs or organizations: Not on file    Relationship status: Not on file  . Intimate partner violence:    Fear of current or ex partner: Not on file    Emotionally abused: Not on  file    Physically abused: Not on file    Forced sexual activity: Not on file  Other Topics Concern  . Not on file  Social History Narrative   Regular exercise:  No   Caffeine: 1-2 cups coffee / tea daily.   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    04/05/2015 last update    Outpatient Medications Prior to Visit  Medication Sig Dispense Refill  . aspirin EC 81 MG tablet Take 81 mg by mouth daily.    . B Complex-Biotin-FA (VITAMIN B50 COMPLEX PO) Take 1 tablet by mouth daily.    . Calcium Carbonate-Vitamin D (CALTRATE 600+D PO) Take 1 tablet by mouth daily.    . Cholecalciferol 2000 UNITS CAPS Take 2,000 Units by mouth daily.     . ferrous sulfate 325 (65 FE) MG tablet Take 325 mg by mouth daily with breakfast.    . hyoscyamine (LEVSIN SL) 0.125 MG SL tablet Place 0.125 mg under the  tongue every 4 (four) hours as needed (acid reflux).    . methocarbamol (ROBAXIN) 500 MG tablet Take 1 tablet (500 mg total) by mouth every 8 (eight) hours as needed for muscle spasms. 30 tablet 0  . Multiple Vitamins-Minerals (MULTIVITAMIN WITH MINERALS) tablet Take 1 tablet by mouth daily.    . naproxen sodium (ALEVE) 220 MG tablet Take 440 mg by mouth daily as needed (headache).    . Omega-3 Fatty Acids (EQL OMEGA 3 FISH OIL) 1400 MG CAPS Take 1,400 mg by mouth daily.     . Probiotic Product (PROBIOTIC ADVANCED) CAPS Take 1 capsule by mouth daily.    . RESTASIS 0.05 % ophthalmic emulsion Place 1 drop into both eyes 2 (two) times daily.  4  . traMADol (ULTRAM) 50 MG tablet Take 1 tablet (50 mg total) by mouth every 8 (eight) hours as needed. 20 tablet 0  . Turmeric 450 MG CAPS Take 450 mg by mouth daily.     . vitamin C (ASCORBIC ACID) 500 MG tablet Take 500 mg by mouth daily.    [name] ALPRAZolam (XANAX) 0.25 MG tablet Take 1 tablet (0.25 mg total) by mouth 3 (three) times daily as needed for anxiety or sleep. 30 tablet 1  . ranitidine (ZANTAC) 300 MG tablet TAKE 1 TABLET (300 MG TOTAL) BY MOUTH AT BEDTIME. (Patient not taking: Reported on 06/27/2017) 30 tablet 1   No facility-administered medications prior to visit.     No Known Allergies  Review of Systems  Constitutional: Negative for fever and malaise/fatigue.  HENT: Negative for congestion.   Eyes: Negative for blurred vision.  Respiratory: Negative for shortness of breath.   Cardiovascular: Negative for chest pain, palpitations and leg swelling.  Gastrointestinal: Negative for abdominal pain, blood in stool and nausea.  Genitourinary: Negative for dysuria and frequency.  Musculoskeletal: Negative for falls.  Skin: Negative for rash.  Neurological: Negative for dizziness, loss of consciousness and headaches.  Endo/Heme/Allergies: Negative for environmental allergies.  Psychiatric/Behavioral: Negative for depression. The patient is  not nervous/anxious.        Objective:    Physical Exam Constitutional:      General: She is not in acute distress.    Appearance: She is not diaphoretic.  HENT:     Head: Normocephalic and atraumatic.     Right Ear: External ear normal.     Left Ear: External ear normal.     Nose:  Nose normal.     Mouth/Throat:     Pharynx: No oropharyngeal exudate.  Eyes:     General: No scleral icterus.       Right eye: No discharge.        Left eye: No discharge.     Conjunctiva/sclera: Conjunctivae normal.     Pupils: Pupils are equal, round, and reactive to light.  Neck:     Musculoskeletal: Normal range of motion and neck supple.     Thyroid: No thyromegaly.  Cardiovascular:     Rate and Rhythm: Normal rate and regular rhythm.     Heart sounds: Normal heart sounds. No murmur.  Pulmonary:     Effort: Pulmonary effort is normal. No respiratory distress.     Breath sounds: Normal breath sounds. No wheezing or rales.  Abdominal:     General: Bowel sounds are normal. There is no distension.     Palpations: Abdomen is soft. There is no mass.     Tenderness: There is no abdominal tenderness.  Musculoskeletal: Normal range of motion.        General: No tenderness.  Lymphadenopathy:     Cervical: No cervical adenopathy.  Skin:    General: Skin is warm and dry.     Findings: No rash.  Neurological:     Mental Status: She is alert and oriented to person, place, and time.     Cranial Nerves: No cranial nerve deficit.     Coordination: Coordination normal.     Deep Tendon Reflexes: Reflexes are normal and symmetric. Reflexes normal.     BP 98/64 (BP Location: Left Arm, Patient Position: Sitting, Cuff Size: Normal)   Pulse 74   Temp 97.9 F (36.6 C) (Oral)   Resp 18   Ht 5\' 4"  (1.626 m)   Wt 115 lb 12.8 oz (52.5 kg)   SpO2 98%   BMI 19.88 kg/m  Wt Readings from Last 3 Encounters:  08/14/18 115 lb 12.8 oz (52.5 kg)  06/27/17 130 lb (59 kg)  02/05/17 135 lb (61.2 kg)     Lab  Results  Component Value Date   WBC 5.5 05/29/2017   HGB 13.1 05/29/2017   HCT 39.7 05/29/2017   PLT 215.0 05/29/2017   GLUCOSE 98 05/29/2017   CHOL 175 05/29/2017   TRIG 57.0 05/29/2017   HDL 65.10 05/29/2017   LDLCALC 99 05/29/2017   ALT 12 05/29/2017   AST 16 05/29/2017   NA 140 05/29/2017   K 4.0 05/29/2017   CL 105 05/29/2017   CREATININE 0.64 05/29/2017   BUN 11 05/29/2017   CO2 31 05/29/2017   TSH 0.21 (L) 05/29/2017   INR 0.98 07/03/2016    Lab Results  Component Value Date   TSH 0.21 (L) 05/29/2017   Lab Results  Component Value Date   WBC 5.5 05/29/2017   HGB 13.1 05/29/2017   HCT 39.7 05/29/2017   MCV 91.1 05/29/2017   PLT 215.0 05/29/2017   Lab Results  Component Value Date   NA 140 05/29/2017   K 4.0 05/29/2017   CO2 31 05/29/2017   GLUCOSE 98 05/29/2017   BUN 11 05/29/2017   CREATININE 0.64 05/29/2017   BILITOT 0.4 05/29/2017   ALKPHOS 41 05/29/2017   AST 16 05/29/2017   ALT 12 05/29/2017   PROT 7.2 05/29/2017   ALBUMIN 4.3 05/29/2017   CALCIUM 9.4 05/29/2017   ANIONGAP 5 07/03/2016   GFR 105.51 05/29/2017   Lab Results  Component Value Date  CHOL 175 05/29/2017   Lab Results  Component Value Date   HDL 65.10 05/29/2017   Lab Results  Component Value Date   LDLCALC 99 05/29/2017   Lab Results  Component Value Date   TRIG 57.0 05/29/2017   Lab Results  Component Value Date   CHOLHDL 3 05/29/2017   No results found for: HGBA1C     Assessment & Plan:   Problem List Items Addressed This Visit    Preventative health care - Primary    She now works for a different healthcare system and is unsure how her labs will be covered so she is given a hand written prescription to use if she has to get labs run out in the community, check tsh, cbc, cmp, lipid. She can return here if the labs are covered. Given and reviewed copy of ACP documents from [employer] and encouraged to complete and return, given blank do to her changed  circumstances      Vitamin D deficiency    Supplement and monitor      Hyperlipidemia, mixed    Encouraged heart healthy diet, increase exercise, avoid trans fats, consider a krill oil cap daily      Depression with anxiety    Has changed jobs and separated from her husband of many years and while this has been stressful she feels this is all for the best and her stress is also much lower already         I have discontinued [facility]'s ranitidine and ALPRAZolam. I am also having her start on conjugated estrogens and famotidine. Additionally, I am having her maintain her Calcium Carbonate-Vitamin D (CALTRATE 600+D PO), aspirin EC, ferrous sulfate, vitamin C, EQL OMEGA 3 FISH OIL, naproxen sodium, multivitamin with minerals, B Complex-Biotin-FA (VITAMIN B50 COMPLEX PO), PROBIOTIC ADVANCED, Turmeric, Cholecalciferol, hyoscyamine, RESTASIS, traMADol, and methocarbamol.  Meds ordered this encounter  Medications  . conjugated estrogens (PREMARIN) vaginal cream    Sig: Apply small amount to vaginal mucosa twice daily    Dispense:  42.5 g    Refill:  1  . famotidine (PEPCID) 40 MG tablet    Sig: Take 1 tablet (40 mg total) by mouth daily.    Dispense:  30 tablet    Refill:  11     [name], MD

## 2018-08-17 NOTE — Assessment & Plan Note (Signed)
Supplement and monitor 

## 2018-10-06 ENCOUNTER — Encounter: Payer: Managed Care, Other (non HMO) | Admitting: Family Medicine
